# Patient Record
Sex: Female | Born: 1945 | Race: White | Hispanic: No | Marital: Married | Smoking: Never smoker
Health system: Southern US, Community
[De-identification: ages and names within clinical notes are randomized; demographics above are authoritative.]

## PROBLEM LIST (undated history)

## (undated) DIAGNOSIS — M792 Neuralgia and neuritis, unspecified: Secondary | ICD-10-CM

## (undated) DIAGNOSIS — G501 Atypical facial pain: Secondary | ICD-10-CM

## (undated) DIAGNOSIS — G4730 Sleep apnea, unspecified: Secondary | ICD-10-CM

## (undated) DIAGNOSIS — T7840XA Allergy, unspecified, initial encounter: Secondary | ICD-10-CM

## (undated) DIAGNOSIS — E785 Hyperlipidemia, unspecified: Secondary | ICD-10-CM

## (undated) DIAGNOSIS — G500 Trigeminal neuralgia: Secondary | ICD-10-CM

## (undated) HISTORY — DX: Trigeminal neuralgia: G50.0

## (undated) HISTORY — PX: COLONOSCOPY: SHX174

## (undated) HISTORY — DX: Atypical facial pain: G50.1

## (undated) HISTORY — DX: Sleep apnea, unspecified: G47.30

## (undated) HISTORY — DX: Neuralgia and neuritis, unspecified: M79.2

## (undated) HISTORY — PX: POLYPECTOMY: SHX149

## (undated) HISTORY — DX: Allergy, unspecified, initial encounter: T78.40XA

## (undated) HISTORY — PX: UPPER GASTROINTESTINAL ENDOSCOPY: SHX188

## (undated) HISTORY — DX: Hyperlipidemia, unspecified: E78.5

---
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
## 2001-11-30 HISTORY — PX: OTHER SURGICAL HISTORY: SHX169

## 2006-10-08 ENCOUNTER — Ambulatory Visit: Payer: Self-pay | Admitting: Internal Medicine

## 2006-10-29 ENCOUNTER — Ambulatory Visit: Payer: Self-pay | Admitting: Internal Medicine

## 2007-03-12 ENCOUNTER — Emergency Department (HOSPITAL_COMMUNITY): Admission: EM | Admit: 2007-03-12 | Discharge: 2007-03-12 | Payer: Self-pay | Admitting: Emergency Medicine

## 2008-11-30 HISTORY — PX: OTHER SURGICAL HISTORY: SHX169

## 2011-07-07 ENCOUNTER — Encounter: Payer: Self-pay | Admitting: Family Medicine

## 2011-07-07 ENCOUNTER — Ambulatory Visit (INDEPENDENT_AMBULATORY_CARE_PROVIDER_SITE_OTHER): Payer: Medicare Other | Admitting: Family Medicine

## 2011-07-07 DIAGNOSIS — Z79899 Other long term (current) drug therapy: Secondary | ICD-10-CM

## 2011-07-07 DIAGNOSIS — E669 Obesity, unspecified: Secondary | ICD-10-CM | POA: Insufficient documentation

## 2011-07-07 DIAGNOSIS — Z01419 Encounter for gynecological examination (general) (routine) without abnormal findings: Secondary | ICD-10-CM

## 2011-07-07 DIAGNOSIS — M8580 Other specified disorders of bone density and structure, unspecified site: Secondary | ICD-10-CM

## 2011-07-07 DIAGNOSIS — E663 Overweight: Secondary | ICD-10-CM

## 2011-07-07 DIAGNOSIS — G4730 Sleep apnea, unspecified: Secondary | ICD-10-CM

## 2011-07-07 DIAGNOSIS — M949 Disorder of cartilage, unspecified: Secondary | ICD-10-CM

## 2011-07-07 DIAGNOSIS — G500 Trigeminal neuralgia: Secondary | ICD-10-CM

## 2011-07-07 DIAGNOSIS — M899 Disorder of bone, unspecified: Secondary | ICD-10-CM

## 2011-07-07 DIAGNOSIS — Z0000 Encounter for general adult medical examination without abnormal findings: Secondary | ICD-10-CM | POA: Insufficient documentation

## 2011-07-07 LAB — HEPATIC FUNCTION PANEL
AST: 31 U/L (ref 0–37)
Alkaline Phosphatase: 78 U/L (ref 39–117)
Total Bilirubin: 0.6 mg/dL (ref 0.3–1.2)

## 2011-07-07 LAB — BASIC METABOLIC PANEL
GFR: 92.24 mL/min (ref >60.00)
Potassium: 4.7 mEq/L (ref 3.5–5.1)
Sodium: 146 mEq/L — ABNORMAL HIGH (ref 135–145)

## 2011-07-07 LAB — TSH: TSH: 0.87 u[IU]/mL (ref 0.35–5.50)

## 2011-07-07 LAB — CBC WITH DIFFERENTIAL/PLATELET
Eosinophils Absolute: 0.1 10*3/uL (ref 0.0–0.7)
MCHC: 33.6 g/dL (ref 30.0–36.0)
MCV: 92.9 fl (ref 78.0–100.0)
Monocytes Absolute: 0.3 10*3/uL (ref 0.1–1.0)
Neutrophils Relative %: 58.9 % (ref 43.0–77.0)
Platelets: 267 10*3/uL (ref 150.0–400.0)

## 2011-07-07 LAB — LIPID PANEL
HDL: 62.9 mg/dL (ref >39.00)
VLDL: 21.2 mg/dL (ref 0.0–40.0)

## 2011-07-07 NOTE — Patient Instructions (Signed)
Schedule your complete physical at your convenience- you can eat before this We'll notify you of your lab results We will do your pap and breast exam at your next visit Welcome!  We're glad to have you!!

## 2011-07-07 NOTE — Progress Notes (Signed)
  Subjective:    Patient ID: Laura Stuart, female    DOB: March 29, 1946, 65 y.o.   MRN: [id number]  HPI New to establish, previous MD- [place] at [facility].  Neuro- Dr Vickey Huger.  GI- [facility].  Trigeminal neuralgia- sees Dr Vickey Huger regularly.  On carbamazepine w/ good control of sxs.  Not currently having sxs.  Sleep apnea- chronic problem for pt, sees Dr Vickey Huger regularly.  Uses CPAP nightly.  BP is normal, no daytime sleepiness.  Will take Ambien as needed, ~2x/month.  Vegetarian- was previously 180 lbs and changed her diet and lost 30 lbs.  Exercises regularly- walks 30-44minutes 5-6x/week.  Also has [employer].  Review of Systems For ROS see HPI     Objective:   Physical Exam  Vitals reviewed. Constitutional: She is oriented to person, place, and time. She appears well-developed and well-nourished. No distress.  HENT:  Head: Normocephalic and atraumatic.  Eyes: Conjunctivae and EOM are normal. Pupils are equal, round, and reactive to light.  Neck: Normal range of motion. Neck supple. No thyromegaly present.  Cardiovascular: Normal rate, regular rhythm, normal heart sounds and intact distal pulses.   No murmur heard. Pulmonary/Chest: Effort normal and breath sounds normal. No respiratory distress.  Abdominal: Soft. She exhibits no distension. There is no tenderness.  Musculoskeletal: She exhibits no edema.  Lymphadenopathy:    She has no cervical adenopathy.  Neurological: She is alert and oriented to person, place, and time.  Skin: Skin is warm and dry.  Psychiatric: She has a normal mood and affect. Her behavior is normal.          Assessment & Plan:

## 2011-07-08 ENCOUNTER — Telehealth: Payer: Self-pay | Admitting: *Deleted

## 2011-07-08 NOTE — Telephone Encounter (Signed)
Left message to call back  

## 2011-07-08 NOTE — Telephone Encounter (Signed)
Message copied by Romualdo Bolk on Wed Jul 08, 2011 11:38 AM ------      Message from: Sheliah Hatch      Created: Wed Jul 08, 2011 10:37 AM       Labs look great!  Keep up the good work!

## 2011-07-09 LAB — VITAMIN D 1,25 DIHYDROXY
Vitamin D 1, 25 (OH)2 Total: 58 pg/mL (ref 18–72)
Vitamin D3 1, 25 (OH)2: 58 pg/mL

## 2011-07-09 NOTE — Telephone Encounter (Signed)
Pt aware of results 

## 2011-07-09 NOTE — Telephone Encounter (Signed)
Patient is out of town---please call her back on her cell phone =[phone]

## 2011-07-10 ENCOUNTER — Telehealth: Payer: Self-pay

## 2011-07-10 NOTE — Telephone Encounter (Signed)
Message copied by Beverely Low on Fri Jul 10, 2011  1:20 PM ------      Message from: Sheliah Hatch      Created: Fri Jul 10, 2011  7:56 AM       Vit D normal

## 2011-07-10 NOTE — Telephone Encounter (Signed)
Left message to notify pt labs nl

## 2011-07-19 NOTE — Assessment & Plan Note (Signed)
On carbamazapine, sxs are well controlled.  Follows regularly w/ neuro.

## 2011-07-19 NOTE — Assessment & Plan Note (Signed)
Pt reports sxs are well controlled w/ CPAP.  Follows w/ Dr Dohmeier regularly.

## 2011-07-19 NOTE — Assessment & Plan Note (Signed)
Per pt report.  Due for DEXA.  Will assess and start meds prn.

## 2011-07-19 NOTE — Assessment & Plan Note (Signed)
Pt has lost significant weight by becoming vegetarian.  Encouraged her to be vigilant about protein intake and making sure she has a balanced diet.  Will get labs to assess for risk factors.

## 2011-07-19 NOTE — Assessment & Plan Note (Signed)
Check labs for upcoming CPE 

## 2011-08-31 ENCOUNTER — Encounter: Payer: Self-pay | Admitting: Family Medicine

## 2011-09-08 ENCOUNTER — Encounter: Payer: Medicare Other | Admitting: Family Medicine

## 2011-09-14 ENCOUNTER — Telehealth: Payer: Self-pay

## 2011-09-14 NOTE — Telephone Encounter (Signed)
Left message to notify pt of bone density results

## 2011-09-15 ENCOUNTER — Encounter: Payer: Self-pay | Admitting: Family Medicine

## 2011-10-06 ENCOUNTER — Telehealth: Payer: Self-pay | Admitting: *Deleted

## 2011-10-06 MED ORDER — ZOLPIDEM TARTRATE 10 MG PO TABS: 10.0000 mg | ORAL_TABLET | Freq: Every evening | ORAL | Status: DC | PRN

## 2011-10-06 NOTE — Telephone Encounter (Signed)
Pt wants refill on Ambien noted last OV 07-07-11 last refill unsure

## 2011-10-06 NOTE — Telephone Encounter (Signed)
Ok for #30, no refills 

## 2011-10-06 NOTE — Telephone Encounter (Signed)
Manually faxed ambien rx to [facility]

## 2011-10-29 ENCOUNTER — Ambulatory Visit (INDEPENDENT_AMBULATORY_CARE_PROVIDER_SITE_OTHER): Payer: Medicare Other | Admitting: Family Medicine

## 2011-10-29 ENCOUNTER — Other Ambulatory Visit (HOSPITAL_COMMUNITY)
Admission: RE | Admit: 2011-10-29 | Discharge: 2011-10-29 | Disposition: A | Discharge status: Home / Self Care | Payer: Medicare Other | Source: Ambulatory Visit | Attending: Family Medicine | Admitting: Family Medicine

## 2011-10-29 ENCOUNTER — Encounter: Payer: Self-pay | Admitting: Family Medicine

## 2011-10-29 VITALS — BP 120/75 | HR 85 | Temp 97.7°F | Ht 65.25 in | Wt 163.2 lb

## 2011-10-29 DIAGNOSIS — Z124 Encounter for screening for malignant neoplasm of cervix: Secondary | ICD-10-CM | POA: Insufficient documentation

## 2011-10-29 DIAGNOSIS — Z01419 Encounter for gynecological examination (general) (routine) without abnormal findings: Secondary | ICD-10-CM | POA: Insufficient documentation

## 2011-10-29 DIAGNOSIS — Z136 Encounter for screening for cardiovascular disorders: Secondary | ICD-10-CM

## 2011-10-29 DIAGNOSIS — Z0000 Encounter for general adult medical examination without abnormal findings: Secondary | ICD-10-CM

## 2011-10-29 DIAGNOSIS — Z23 Encounter for immunization: Secondary | ICD-10-CM

## 2011-10-29 NOTE — Progress Notes (Signed)
  Subjective:    Patient ID: Laura Stuart, female    DOB: 1946/10/09, 65 y.o.   MRN: [id number]  HPI Here today for CPE.  Risk Factors: Osteopenia- recent DEXA confirms osteopenia, on Ca Citrate and Vit D 2500 units daily. Physical Activity: exercising regularly (walking 2.5 miles) Fall Risk: Low risk Depression: no current sxs Hearing: normal to conversational tones, slightly decreased to whispered voice ADL's: independent Cognitive: normal linear thought process, memory and attention intact Home Safety: safe at home, lives w/ husband Height, Weight, BMI, Visual Acuity: see vitals, vision corrected to 20/20 w/ glasses Counseling: UTD on mammo and DEXA, UTD on colonoscopy (Dr Jarold Motto), has had both pneumovax and zostavax Labs Ordered: See A&P Care Plan: See A&P    Review of Systems Patient reports no vision/ hearing changes, adenopathy,fever, weight change,  persistant/recurrent hoarseness , swallowing issues, chest pain, palpitations, edema, persistant/recurrent cough, hemoptysis, dyspnea (rest/exertional/paroxysmal nocturnal), gastrointestinal bleeding (melena, rectal bleeding), abdominal pain, significant heartburn, bowel changes, GU symptoms (dysuria, hematuria, incontinence), Gyn symptoms (abnormal  bleeding, pain),  syncope, focal weakness, memory loss, numbness & tingling, skin/hair/nail changes, abnormal bruising or bleeding, anxiety, or depression.     Objective:   Physical Exam  General Appearance:    Alert, cooperative, no distress, appears stated age  Head:    Normocephalic, without obvious abnormality, atraumatic  Eyes:    PERRL, conjunctiva/corneas clear, EOM's intact, fundi    benign, both eyes  Ears:    Normal TM's and external ear canals, both ears  Nose:   Nares normal, septum midline, mucosa normal, no drainage    or sinus tenderness  Throat:   Lips, mucosa, and tongue normal; teeth and gums normal  Neck:   Supple, symmetrical, trachea midline, no  adenopathy;    Thyroid: no enlargement/tenderness/nodules  Back:     Symmetric, no curvature, ROM normal, no CVA tenderness  Lungs:     Clear to auscultation bilaterally, respirations unlabored  Chest Wall:    No tenderness or deformity   Heart:    Regular rate and rhythm, S1 and S2 normal, no murmur, rub   or gallop  Breast Exam:    No tenderness, masses, or nipple abnormality  Abdomen:     Soft, non-tender, bowel sounds active all four quadrants,    no masses, no organomegaly  Genitalia:    External genitalia normal, cervix normal in appearance, no CMT, uterus in normal size and position, adnexa w/out mass or tenderness, mucosa pink and moist, no lesions or discharge present  Rectal:    Normal external appearance  Extremities:   Extremities normal, atraumatic, no cyanosis or edema  Pulses:   2+ and symmetric all extremities  Skin:   Skin color, texture, turgor normal, no rashes or lesions  Lymph nodes:   Cervical, supraclavicular, and axillary nodes normal  Neurologic:   CNII-XII intact, normal strength, sensation and reflexes    throughout          Assessment & Plan:

## 2011-10-29 NOTE — Patient Instructions (Signed)
Your exam look great!  Keep up the good work! Go to [address] to get your chest xray, we'll call you with the results Call with any questions or concerns Happy Holidays!!!

## 2011-11-08 NOTE — Assessment & Plan Note (Signed)
Pap collected. 

## 2011-11-08 NOTE — Assessment & Plan Note (Signed)
Pt's PE WNL.  UTD on health maintenance.  Will get screening CXR as part of Welcome to [employer] PE.  EKG done- see document for interpretation.  Reviewed previous labs.  Anticipatory guidance provided.

## 2012-02-03 ENCOUNTER — Telehealth: Payer: Self-pay | Admitting: Family Medicine

## 2012-02-03 NOTE — Telephone Encounter (Signed)
Zolpidem Tartrate (Tab) AMBIEN 10 MG Take 1 tablet (10 mg total) by mouth at bedtime as needed.

## 2012-02-04 MED ORDER — ZOLPIDEM TARTRATE 10 MG PO TABS: 10.0000 mg | ORAL_TABLET | Freq: Every evening | ORAL | Status: DC | PRN

## 2012-02-04 NOTE — Telephone Encounter (Signed)
Ok for #30, 1 refill 

## 2012-02-04 NOTE — Telephone Encounter (Signed)
Last OV 10-28-12 last refill 10-06-11 #30 no refills

## 2012-02-04 NOTE — Telephone Encounter (Signed)
.  rx faxed to pharmacy, manually.  

## 2012-05-20 ENCOUNTER — Other Ambulatory Visit: Payer: Self-pay | Admitting: Family Medicine

## 2012-05-20 ENCOUNTER — Other Ambulatory Visit (INDEPENDENT_AMBULATORY_CARE_PROVIDER_SITE_OTHER): Payer: Medicare Other

## 2012-05-20 DIAGNOSIS — M6281 Muscle weakness (generalized): Secondary | ICD-10-CM

## 2012-05-20 LAB — CBC WITH DIFFERENTIAL/PLATELET
Eosinophils Absolute: 0.1 10*3/uL (ref 0.0–0.7)
Eosinophils Relative: 2.2 % (ref 0.0–5.0)
HCT: 37.5 % (ref 36.0–46.0)
Lymphs Abs: 1.2 10*3/uL (ref 0.7–4.0)
MCHC: 33.5 g/dL (ref 30.0–36.0)
MCV: 92.9 fl (ref 78.0–100.0)
Monocytes Absolute: 0.4 10*3/uL (ref 0.1–1.0)
Neutrophils Relative %: 56.8 % (ref 43.0–77.0)
Platelets: 220 10*3/uL (ref 150.0–400.0)
RDW: 13.7 % (ref 11.5–14.6)

## 2012-05-20 MED ORDER — ZOLPIDEM TARTRATE 10 MG PO TABS: 10.0000 mg | ORAL_TABLET | Freq: Every evening | ORAL | Status: DC | PRN

## 2012-05-20 NOTE — Telephone Encounter (Signed)
.  rx faxed to pharmacy, manually.  

## 2012-05-20 NOTE — Telephone Encounter (Signed)
Ok for #30, 1 refill 

## 2012-05-20 NOTE — Telephone Encounter (Signed)
Last OV 10-29-11 last refill 02-04-12 #30 with 1 refill

## 2012-08-26 ENCOUNTER — Other Ambulatory Visit: Payer: Self-pay

## 2012-08-26 MED ORDER — ZOLPIDEM TARTRATE 10 MG PO TABS: 10.0000 mg | ORAL_TABLET | Freq: Every evening | ORAL | Status: DC | PRN

## 2012-08-26 NOTE — Telephone Encounter (Signed)
Need okay.   MW  

## 2012-09-02 ENCOUNTER — Encounter: Payer: Self-pay | Admitting: Family Medicine

## 2013-02-05 ENCOUNTER — Ambulatory Visit: Payer: Medicare Other

## 2013-02-05 ENCOUNTER — Ambulatory Visit (INDEPENDENT_AMBULATORY_CARE_PROVIDER_SITE_OTHER): Payer: Medicare Other | Admitting: Family Medicine

## 2013-02-05 ENCOUNTER — Other Ambulatory Visit: Payer: Self-pay | Admitting: Family Medicine

## 2013-02-05 VITALS — BP 108/62 | HR 70 | Temp 98.4°F | Resp 16 | Ht 64.5 in | Wt 170.6 lb

## 2013-02-05 DIAGNOSIS — R52 Pain, unspecified: Secondary | ICD-10-CM

## 2013-02-05 DIAGNOSIS — K625 Hemorrhage of anus and rectum: Secondary | ICD-10-CM

## 2013-02-05 DIAGNOSIS — R109 Unspecified abdominal pain: Secondary | ICD-10-CM

## 2013-02-05 LAB — POCT CBC
Granulocyte percent: 78 %G (ref 37–80)
HCT, POC: 39 % (ref 37.7–47.9)
Hemoglobin: 12.8 g/dL (ref 12.2–16.2)
MCV: 90.6 fL (ref 80–97)
POC Granulocyte: 6 (ref 2–6.9)
POC LYMPH PERCENT: 14.2 %L (ref 10–50)
RBC: 4.31 M/uL (ref 4.04–5.48)
RDW, POC: 13.1 %

## 2013-02-05 LAB — IFOBT (OCCULT BLOOD): IFOBT: POSITIVE

## 2013-02-05 MED ORDER — HYDROCODONE-ACETAMINOPHEN 5-325 MG PO TABS: 1.0000 | ORAL_TABLET | Freq: Four times a day (QID) | ORAL | Status: DC | PRN

## 2013-02-05 MED ORDER — METRONIDAZOLE 500 MG PO TABS: 500.0000 mg | ORAL_TABLET | Freq: Three times a day (TID) | ORAL | Status: DC

## 2013-02-05 MED ORDER — CIPROFLOXACIN HCL 500 MG PO TABS: 500.0000 mg | ORAL_TABLET | Freq: Two times a day (BID) | ORAL | Status: DC
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
## 2013-02-05 NOTE — Patient Instructions (Addendum)
Colitis °Colitis is inflammation of the colon. Colitis can be a short-term or long-standing (chronic) illness. Crohn's disease and ulcerative colitis are 2 types of colitis which are chronic. They usually require lifelong treatment. °CAUSES  °There are many different causes of colitis, including: °· Viruses. °· Germs (bacteria). °· Medicine reactions. °SYMPTOMS  °· Diarrhea. °· Intestinal bleeding. °· Pain. °· Fever. °· Throwing up (vomiting). °· Tiredness (fatigue). °· Weight loss. °· Bowel blockage. °DIAGNOSIS  °The diagnosis of colitis is based on examination and stool or blood tests. X-rays, CT scan, and colonoscopy may also be needed. °TREATMENT  °Treatment may include: °· Fluids given through the vein (intravenously). °· Bowel rest (nothing to eat or drink for a period of time). °· Medicine for pain and diarrhea. °· Medicines (antibiotics) that kill germs. °· Cortisone medicines. °· Surgery. °HOME CARE INSTRUCTIONS  °· Get plenty of rest. °· Drink enough water and fluids to keep your urine clear or pale yellow. °· Eat a well-balanced diet. °· Call your caregiver for follow-up as recommended. °SEEK IMMEDIATE MEDICAL CARE IF:  °· You develop chills. °· You have an oral temperature above 102° F (38.9° C), not controlled by medicine. °· You have extreme weakness, fainting, or dehydration. °· You have repeated vomiting. °· You develop severe belly (abdominal) pain or are passing bloody or tarry stools. °MAKE SURE YOU:  °· Understand these instructions. °· Will watch your condition. °· Will get help right away if you are not doing well or get worse. °Document Released: 12/24/2004 Document Revised: 02/08/2012 Document Reviewed: 03/21/2010 °ExitCare® Patient Information ©2013 [employer]. ° °

## 2013-02-06 ENCOUNTER — Telehealth: Payer: Self-pay

## 2013-02-06 NOTE — Telephone Encounter (Signed)
Spoke to patient advised we will call her when they come in. She states she is better, but still has a few symptoms. She will await our call for culture reports and follow up instructions Amy.

## 2013-02-06 NOTE — Addendum Note (Signed)
Addended by: Thelma Barge D on: 02/06/2013 02:30 PM   Modules accepted: Orders

## 2013-02-06 NOTE — Telephone Encounter (Signed)
Patient wanted to ask Dr Clelia Croft whether she should be seen again. She brought her samples in today and is feeling a little better. She is wondering if she should wait for the results to come back for these samples or if she should be seen.  Best [phone]

## 2013-02-07 LAB — CLOSTRIDIUM DIFFICILE EIA: CDIFTX: NEGATIVE

## 2013-02-07 LAB — COMPREHENSIVE METABOLIC PANEL
Albumin: 4.2 g/dL (ref 3.5–5.2)
Alkaline Phosphatase: 93 U/L (ref 39–117)
BUN: 16 mg/dL (ref 6–23)
CO2: 25 mEq/L (ref 19–32)
Calcium: 9.5 mg/dL (ref 8.4–10.5)
Chloride: 106 mEq/L (ref 96–112)
Glucose, Bld: 108 mg/dL — ABNORMAL HIGH (ref 70–99)
Potassium: 4.6 mEq/L (ref 3.5–5.3)
Sodium: 142 mEq/L (ref 135–145)
Total Protein: 6.4 g/dL (ref 6.0–8.3)

## 2013-02-09 LAB — STOOL CULTURE

## 2013-04-01 ENCOUNTER — Encounter: Payer: Self-pay | Admitting: Family Medicine

## 2013-05-02 ENCOUNTER — Telehealth: Payer: Self-pay | Admitting: *Deleted

## 2013-05-02 NOTE — Telephone Encounter (Signed)
No refill- pt has not been seen since 2012

## 2013-05-02 NOTE — Telephone Encounter (Signed)
Last OV 10-29-11, last filled 08-26-12 #30 1

## 2013-05-02 NOTE — Telephone Encounter (Signed)
LM @ (5:30pm) asking the pt to RTC.  Regarding med request.//AB/CMA

## 2013-05-03 NOTE — Telephone Encounter (Signed)
Pt was informed that refill was denied,so pt scheduled an appt with Dr. Beverely Low to discuss refilling med.//AB/CMA

## 2013-05-04 ENCOUNTER — Ambulatory Visit (INDEPENDENT_AMBULATORY_CARE_PROVIDER_SITE_OTHER): Payer: Medicare Other | Admitting: Family Medicine

## 2013-05-04 ENCOUNTER — Encounter: Payer: Self-pay | Admitting: Family Medicine

## 2013-05-04 VITALS — BP 100/60 | HR 84 | Temp 98.1°F | Ht 64.5 in | Wt 174.8 lb

## 2013-05-04 DIAGNOSIS — G4700 Insomnia, unspecified: Secondary | ICD-10-CM

## 2013-05-04 MED ORDER — ZOLPIDEM TARTRATE 10 MG PO TABS: 10.0000 mg | ORAL_TABLET | Freq: Every evening | ORAL | Status: DC | PRN

## 2013-05-04 NOTE — Patient Instructions (Addendum)
Follow up as scheduled for your physical Take the Ambien as needed Call with any questions or concerns Happy [place] Birthday!!!

## 2013-05-04 NOTE — Progress Notes (Signed)
  Subjective:    Patient ID: Laura Stuart, female    DOB: 1946/10/20, 67 y.o.   MRN: [id number]  HPI Insomnia- pt has sleep apnea and will require Ambien to sleep b/c of the machinery.  Started 7 yrs ago when pt was very stressed.  Difficulty falling asleep.  No difficulty staying asleep.  No AM hangover.  No motor activities while sleeping.   Review of Systems For ROS see HPI     Objective:   Physical Exam  Vitals reviewed. Constitutional: She is oriented to person, place, and time. She appears well-developed and well-nourished. No distress.  HENT:  Head: Normocephalic and atraumatic.  Neck: Normal range of motion. Neck supple. No thyromegaly present.  Cardiovascular: Normal rate, regular rhythm and normal heart sounds.   Pulmonary/Chest: Effort normal and breath sounds normal. No respiratory distress. She has no wheezes. She has no rales.  Lymphadenopathy:    She has no cervical adenopathy.  Neurological: She is alert and oriented to person, place, and time.  Psychiatric: She has a normal mood and affect. Her behavior is normal.          Assessment & Plan:

## 2013-05-07 NOTE — Assessment & Plan Note (Signed)
New.  Pt takes Ambien to fall asleep when wearing the CPAP machinery.  Refill provided.

## 2013-05-19 ENCOUNTER — Encounter: Payer: Self-pay | Admitting: Lab

## 2013-05-22 ENCOUNTER — Ambulatory Visit (INDEPENDENT_AMBULATORY_CARE_PROVIDER_SITE_OTHER): Payer: Medicare Other | Admitting: Family Medicine

## 2013-05-22 ENCOUNTER — Encounter: Payer: Self-pay | Admitting: Family Medicine

## 2013-05-22 VITALS — BP 106/80 | HR 69 | Temp 97.9°F | Ht 64.5 in | Wt 174.6 lb

## 2013-05-22 DIAGNOSIS — E663 Overweight: Secondary | ICD-10-CM

## 2013-05-22 DIAGNOSIS — Z23 Encounter for immunization: Secondary | ICD-10-CM

## 2013-05-22 DIAGNOSIS — M899 Disorder of bone, unspecified: Secondary | ICD-10-CM

## 2013-05-22 DIAGNOSIS — Z01419 Encounter for gynecological examination (general) (routine) without abnormal findings: Secondary | ICD-10-CM

## 2013-05-22 DIAGNOSIS — Z1331 Encounter for screening for depression: Secondary | ICD-10-CM

## 2013-05-22 DIAGNOSIS — M949 Disorder of cartilage, unspecified: Secondary | ICD-10-CM

## 2013-05-22 DIAGNOSIS — M8580 Other specified disorders of bone density and structure, unspecified site: Secondary | ICD-10-CM

## 2013-05-22 LAB — HEPATIC FUNCTION PANEL
ALT: 20 U/L (ref 0–35)
AST: 24 U/L (ref 0–37)
Albumin: 3.7 g/dL (ref 3.5–5.2)
Alkaline Phosphatase: 68 U/L (ref 39–117)
Bilirubin, Direct: 0.1 mg/dL (ref 0.0–0.3)
Total Bilirubin: 0.5 mg/dL (ref 0.3–1.2)
Total Protein: 6.3 g/dL (ref 6.0–8.3)

## 2013-05-22 LAB — BASIC METABOLIC PANEL
BUN: 21 mg/dL (ref 6–23)
CO2: 29 mEq/L (ref 19–32)
Calcium: 9 mg/dL (ref 8.4–10.5)
Chloride: 108 mEq/L (ref 96–112)
Creatinine, Ser: 0.7 mg/dL (ref 0.4–1.2)
GFR: 83.18 mL/min (ref >60.00)
Glucose, Bld: 92 mg/dL (ref 70–99)
Potassium: 3.7 mEq/L (ref 3.5–5.1)
Sodium: 142 mEq/L (ref 135–145)

## 2013-05-22 LAB — CBC WITH DIFFERENTIAL/PLATELET
Basophils Relative: 1.7 % (ref 0.0–3.0)
Eosinophils Relative: 3.6 % (ref 0.0–5.0)
HCT: 33.7 % — ABNORMAL LOW (ref 36.0–46.0)
MCV: 88.7 fl (ref 78.0–100.0)
Monocytes Absolute: 0.3 10*3/uL (ref 0.1–1.0)
Monocytes Relative: 9.3 % (ref 3.0–12.0)
Neutrophils Relative %: 57.4 % (ref 43.0–77.0)
RBC: 3.8 Mil/uL — ABNORMAL LOW (ref 3.87–5.11)
WBC: 3.7 10*3/uL — ABNORMAL LOW (ref 4.5–10.5)

## 2013-05-22 LAB — TSH: TSH: 3.93 u[IU]/mL (ref 0.35–5.50)

## 2013-05-22 LAB — LIPID PANEL: HDL: 53.2 mg/dL (ref >39.00)

## 2013-05-22 LAB — LDL CHOLESTEROL, DIRECT: Direct LDL: 106.9 mg/dL

## 2013-05-22 NOTE — Assessment & Plan Note (Signed)
Chronic problem.  On Ca and Vit D daily.  UTD on DEXA.  Check Vit D level.  Will follow.

## 2013-05-22 NOTE — Patient Instructions (Addendum)
Follow up in 1 year or as needed We'll notify you of your lab results and make any changes if needed Keep up the good work!  You look great! Call with any questions or concerns Have a great summer!!! 

## 2013-05-22 NOTE — Progress Notes (Signed)
  Subjective:    Patient ID: Laura Stuart, female    DOB: 06/29/1946, 67 y.o.   MRN: [id number]  HPI Here today for CPE.  Risk Factors: Osteopenia- chronic problem, on Ca and Vit D daily.  Walking regularly.  UTD on DEXA. Overweight- chronic problem, no longer Vegan but remains vegetarian.  Weight is again creeping up after losing 30 lbs.  Pt is concerned about her labs. Physical Activity: very active, walking regularly Fall Risk: low risk Depression: denies current sxs Hearing: normal to conversational tones and whispered voice at 6 ft ADL's: independent Cognitive: normal linear thought process, memory and attention intact Home Safety: safe at home, lives w/ husband Height, Weight, BMI, Visual Acuity: see vitals, vision corrected to 20/20 w/ glasses Counseling: UTD on colonoscopy, mammo, DEXA, pap Labs Ordered: See A&P Care Plan: See A&P    Review of Systems Patient reports no vision/ hearing changes, adenopathy,fever, weight change,  persistant/recurrent hoarseness , swallowing issues, chest pain, palpitations, edema, persistant/recurrent cough, hemoptysis, dyspnea (rest/exertional/paroxysmal nocturnal), gastrointestinal bleeding (melena, rectal bleeding), abdominal pain, significant heartburn, bowel changes, GU symptoms (dysuria, hematuria, incontinence), Gyn symptoms (abnormal  bleeding, pain),  syncope, focal weakness, memory loss, numbness & tingling, skin/hair/nail changes, abnormal bruising or bleeding, anxiety, or depression.     Objective:   Physical Exam General Appearance:    Alert, cooperative, no distress, appears stated age  Head:    Normocephalic, without obvious abnormality, atraumatic  Eyes:    PERRL, conjunctiva/corneas clear, EOM's intact, fundi    benign, both eyes  Ears:    Normal TM's and external ear canals, both ears  Nose:   Nares normal, septum midline, mucosa normal, no drainage    or sinus tenderness  Throat:   Lips, mucosa, and tongue normal;  teeth and gums normal  Neck:   Supple, symmetrical, trachea midline, no adenopathy;    Thyroid: no enlargement/tenderness/nodules  Back:     Symmetric, no curvature, ROM normal, no CVA tenderness  Lungs:     Clear to auscultation bilaterally, respirations unlabored  Chest Wall:    No tenderness or deformity   Heart:    Regular rate and rhythm, S1 and S2 normal, no murmur, rub   or gallop  Breast Exam:    Deferred to GYN  Abdomen:     Soft, non-tender, bowel sounds active all four quadrants,    no masses, no organomegaly  Genitalia:    Deferred to GYN  Rectal:    Extremities:   Extremities normal, atraumatic, no cyanosis or edema  Pulses:   2+ and symmetric all extremities  Skin:   Skin color, texture, turgor normal, no rashes or lesions  Lymph nodes:   Cervical, supraclavicular, and axillary nodes normal  Neurologic:   CNII-XII intact, normal strength, sensation and reflexes    throughout          Assessment & Plan:

## 2013-05-22 NOTE — Assessment & Plan Note (Signed)
Pt's PE WNL.  UTD on health maintenance.  Pneumovax given today.  Check labs.  Anticipatory guidance provided.

## 2013-05-22 NOTE — Assessment & Plan Note (Signed)
Ongoing problem for pt.  Had lost 30 lbs when vegan.  Weight is starting to increase again.  Check labs to risk stratify.  Encouraged healthy diet and regular exercise.  Will follow.

## 2013-05-22 NOTE — Addendum Note (Signed)
Addended by: Verdie Shire on: 05/22/2013 09:41 AM   Modules accepted: Orders

## 2013-05-26 LAB — VITAMIN D 1,25 DIHYDROXY
Vitamin D 1, 25 (OH)2 Total: 37 pg/mL (ref 18–72)
Vitamin D2 1, 25 (OH)2: <8 pg/mL
Vitamin D3 1, 25 (OH)2: 37 pg/mL

## 2013-06-30 HISTORY — PX: ROOT CANAL: SHX2363

## 2013-07-05 ENCOUNTER — Telehealth: Payer: Self-pay | Admitting: Neurology

## 2013-07-05 MED ORDER — CARBAMAZEPINE ER 200 MG PO TB12: 200.0000 mg | ORAL_TABLET | Freq: Three times a day (TID) | ORAL | Status: DC

## 2013-07-05 NOTE — Telephone Encounter (Signed)
A 30 day Rx has been sent to the local pharmacy and a 90 day Rx has been sent to the mail order pharmacy.

## 2013-07-24 ENCOUNTER — Encounter: Payer: Self-pay | Admitting: Neurology

## 2013-07-26 ENCOUNTER — Encounter: Payer: Self-pay | Admitting: Neurology

## 2013-07-26 ENCOUNTER — Ambulatory Visit (INDEPENDENT_AMBULATORY_CARE_PROVIDER_SITE_OTHER): Payer: Self-pay | Admitting: Neurology

## 2013-07-26 VITALS — BP 121/71 | HR 67 | Resp 16 | Ht 64.0 in | Wt 174.0 lb

## 2013-07-26 DIAGNOSIS — G4733 Obstructive sleep apnea (adult) (pediatric): Secondary | ICD-10-CM

## 2013-07-26 DIAGNOSIS — G4730 Sleep apnea, unspecified: Secondary | ICD-10-CM | POA: Insufficient documentation

## 2013-07-26 NOTE — Patient Instructions (Signed)
Exercise to Lose Weight Exercise and a healthy diet may help you lose weight. Your doctor may suggest specific exercises. EXERCISE IDEAS AND TIPS  Choose low-cost things you enjoy doing, such as walking, bicycling, or exercising to workout videos.  Take stairs instead of the elevator.  Walk during your lunch break.  Park your car further away from work or school.  Go to a gym or an exercise class.  Start with 5 to 10 minutes of exercise each day. Build up to 30 minutes of exercise 4 to 6 days a week.  Wear shoes with good support and comfortable clothes.  Stretch before and after working out.  Work out until you breathe harder and your heart beats faster.  Drink extra water when you exercise.  Do not do so much that you hurt yourself, feel dizzy, or get very short of breath. Exercises that burn about 150 calories:  Running 1  miles in 15 minutes.  Playing volleyball for 45 to 60 minutes.  Washing and waxing a car for 45 to 60 minutes.  Playing touch football for 45 minutes.  Walking 1  miles in 35 minutes.  Pushing a stroller 1  miles in 30 minutes.  Playing basketball for 30 minutes.  Raking leaves for 30 minutes.  Bicycling 5 miles in 30 minutes.  Walking 2 miles in 30 minutes.  Dancing for 30 minutes.  Shoveling snow for 15 minutes.  Swimming laps for 20 minutes.  Walking up stairs for 15 minutes.  Bicycling 4 miles in 15 minutes.  Gardening for 30 to 45 minutes.  Jumping rope for 15 minutes.  Washing windows or floors for 45 to 60 minutes. Document Released: 12/19/2010 Document Revised: 02/08/2012 Document Reviewed: 12/19/2010 [facility] Patient Information 2014 [place], [place]. CPAP and BIPAP CPAP and BIPAP are methods of helping you breathe. CPAP stands for "continuous positive airway pressure." BIPAP stands for "bi-level positive airway pressure." Both CPAP and BIPAP are provided by a small machine with a flexible plastic tube that attaches to  a plastic mask that goes over your nose or mouth. Air is blown into your air passages through your nose or mouth. This helps to keep your airways open and helps to keep you breathing well. The amount of pressure that is used to blow the air into your air passages can be set on the machine. The pressure setting is based on your needs. With CPAP, the amount of pressure stays the same while you breathe in and out. With BIPAP, the amount of pressure changes when you inhale and exhale. Your caregiver will recommend whether CPAP or BIPAP would be more helpful for you.  CPAP and BIPAP can be helpful for both adults and children with:  Sleep apnea.  Chronic Obstructive Pulmonary Disease (COPD), a condition like emphysema.  Diseases which weaken the muscles of the chest such as muscular dystrophy or neurological diseases.  Other problems that cause breathing to be weak or difficult. USE OF CPAP OR BIPAP The respiratory therapist or technician will help you get used to wearing the mask. Some people feel claustrophobic (a trapped or closed in feeling) at first, because the mask needs to be fairly snug on your face.   It may help you to get used to the mask gradually, by first holding the mask loosely over your nose or mouth using a low pressure setting on the machine. Gradually the mask can be applied more snugly with increased pressure. You can also gradually increase the amount of time  the mask is used.  People with sleep apnea will use the mask and machine at night when they are sleeping. Others, like those with ALS or other breathing difficulties, may need the CPAP or BIPAP all the time.  If the first mask you try does not fit well, or is uncomfortable, there are other types and sizes that can be tried.  If you tend to breathe through your mouth, a chin strap may be applied to help keep your mouth closed (if you are using a nasal mask).  The CPAP and BIPAP machines have alarms that may sound if the  mask comes off or develops a leak.  You should not eat or drink while the CPAP or BIPAP is on. Food or fluids could get pushed into your lungs by the pressure of the CPAP or BIPAP. Sometimes CPAP or BIPAP machines are ordered for home use. If you are going to use the CPAP or BIPAP machine at home, follow these instructions  CPAP or BIPAP machines can be rented or purchased through home health care companies. There are many different brands of machines available. If you rent a machine before purchasing you may find which particular machine works well for you.  Ask questions if there is something you do not understand when picking out your machine.  Place your CPAP or BIPAP machine on a secure table or stand near an electrical outlet.  Know where the On/Off switch is.  Follow your doctor's instructions for how to set the pressure on your machine and when you should use it.  Do not smoke! Tobacco smoke residue can damage the machine. SEEK IMMEDIATE MEDICAL CARE IF:   You have redness or open areas around your nose or mouth.  You have trouble operating the CPAP or BIPAP machine.  You cannot tolerate wearing the CPAP or BIPAP mask.  You have any questions or concerns. Document Released: 08/14/2004 Document Revised: 02/08/2012 Document Reviewed: 11/13/2008 [facility] Patient Information 2014 [place], [place].

## 2013-07-26 NOTE — Progress Notes (Signed)
[facility]  Provider:  Melvyn Novas, M D  Referring Provider: Sheliah Hatch, MD Primary Care Physician:  Neena Rhymes, MD  Chief Complaint  Patient presents with  . Follow-up    1 year f/u rm 10    HPI:  Laura Stuart is a 67 y.o. female  Is seen here as a  revisit  from Dr. Beverely Low for sleep apnea .  Laura Stuart in here today for her yearly revisit. In the past, her primary care physician had been Dr. Nada Libman back at [facility], and referred her for a sleep study on 02-09-2008. The patient at that time had some daytime sleepiness snoring and complained of insomnia. She had also a medical history of trigeminal neuralgia and was treated already with carbamazepine at that time. She had and was the Epworth Sleepiness Scale at 5 points and to ask inventory at 5 points. She was placed on CPAP at a pressure of 7 cm water with an E. TR of 3 cm water. Residual AHI was 3.9 and average daily usage was 6 hours and 34 minutes. These other data from some larger cells and 13. The patient will bring her memory chip was her this afternoon to obtain an in office download. Please note that in the 2009 study AH I was 14.1 (sleep AHI was 53.3.  In June of last year Dr. Beverely Low was meanwhile her primary care physician the patient underwent a CBC with differential showing a low white blood cell count at 4.1. as expected as a side effect of Tegretol she had nor hyponatremia and normal other medical side effects. Earlier this year she had briefly an upper respiratory infection and couldn't he was for CPAP for a while on resumed using it after recovery.  Today his Epworth sleepiness score is at compliance the patient endorsed a GDS score of 2 points , her verbally reported compliance is high  as well.    Review of Systems: Out of a complete 14 system review, the patient complains of only the following symptoms, and all other reviewed systems are negative.  no longer  snoring, no longer insomnia, no nocturia. Ambien prn .  History   Social History  . Marital Status: Single    Spouse Name: N/A    Number of Children: 2  . Years of Education: N/A   Occupational History  .      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 Smoking status: Never Smoker   . Smokeless tobacco: Not on file  . Alcohol Use: Yes     Comment: 1 glass every 2 months; socially  . Drug Use: No  . Sexual Activity: Not on file   Other Topics Concern  . Not on file   Social History Narrative  . No narrative on file    Family History  Problem Relation Age of Onset  . Stroke Mother   . Brain cancer Mother   . Lung cancer Mother   . Heart attack Mother   . Emphysema Father   . Alzheimer's disease Sister   . Dementia Other     evident after a fall with a hip fracture    Past Medical History  Diagnosis Date  . Allergy   . Sleep apnea     using cpap,OSA  . Trigeminal neuralgia   . Trigeminal neuralgia   . Neuralgia     Trigeminal  .  Atypical facial pain     Past Surgical History  Procedure Laterality Date  . Tonsillectomy and adenoidectomy  1950  . Torn miniscus  2003  . Toes right foot  2010  . Vaginal delivery      X2  . Root canal  06/2013    Current Outpatient Prescriptions  Medication Sig Dispense Refill  . calcium-vitamin D (OSCAL WITH D) 500-200 MG-UNIT per tablet Take 1 tablet by mouth 2 (two) times daily.       . carbamazepine (TEGRETOL-XR) 200 MG 12 hr tablet Take 1 tablet (200 mg total) by mouth 3 (three) times daily.  270 tablet  0  . Halcinonide (HALOG) 0.1 % CREA Apply topically.        Marland Kitchen zolpidem (AMBIEN) 10 MG tablet Take 1 tablet (10 mg total) by mouth at bedtime as needed.  30 tablet  1   No current facility-administered medications for this visit.    Allergies as of 07/26/2013 - Review Complete 07/26/2013  Allergen Reaction Noted  . Penicillins  07/07/2011     Vitals: BP 121/71  Pulse 67  Resp 16  Ht 5\' 4"  (1.626 m)  Wt 174 lb (78.926 kg)  BMI 29.85 kg/m2 Last Weight:  Wt Readings from Last 1 Encounters:  07/26/13 174 lb (78.926 kg)   Last Height:   Ht Readings from Last 1 Encounters:  07/26/13 5\' 4"  (1.626 m)    Physical exam:  General: The patient is awake, alert and appears not in acute distress. The patient is well groomed. Head: Normocephalic, atraumatic. Neck is supple. Mallampati 2 , neck circumference: 14.5 . Cardiovascular:  Regular rate and rhythm, without  murmurs or carotid bruit, and without distended neck veins. Respiratory: Lungs are clear to auscultation. Skin:  Without evidence of edema, or rash Trunk: BMI is elevated and patient  has normal posture.  Neurologic exam : The patient is awake and alert, oriented to place and time.  Memory subjective  described as intact. There is a normal attention span & concentration ability. Speech is fluent without  dysarthria, dysphonia or aphasia.  Mood and affect are appropriate.  Cranial nerves: Pupils are equal and briskly reactive to light. Funduscopic exam without  evidence of pallor or edema.  Extraocular movements  in vertical and horizontal planes intact and without nystagmus. Visual fields by finger perimetry are intact. Hearing to finger rub intact.  Facial sensation intact to fine touch.  Facial motor strength is symmetric and tongue and uvula move midline.  Motor exam:   Normal tone and normal muscle bulk and symmetric normal strength in all extremities.  Sensory:  Fine touch, pinprick and vibration were normal  in all extremities. Proprioception is  normal.  Coordination: Rapid alternating movements in the fingers/hands is tested and normal. Finger-to-nose maneuver without evidence of ataxia, dysmetria or tremor.  Gait and station: Patient walks without assistive device and is able and assisted stool climb up to the exam table.  Strength within normal limits.  Stance is stable and normal. Steps are unfragmented. Romberg testing is normal.  Deep tendon reflexes: in the  upper and lower extremities are symmetric and intact. Babinski  downgoing.   Assessment:  OSA on CPAP.    Trigmeninal neuralgia controlled on Tegretol. CBC diff. Last year documented 4.1K WBC.  Plan:  Treatment plan and additional workup : continued use of CPAP - adjustment will be made if to nights download shows a higher AHI than 6/hr.

## 2013-09-06 ENCOUNTER — Telehealth: Payer: Self-pay | Admitting: General Practice

## 2013-09-06 NOTE — Telephone Encounter (Signed)
Pt notified of results of her recent Bone density (osteopenia) . States she currently takes ostacal 500mg  BID and D3 1000 iu daily. Is this ok or should she increase dosage?    Ok to call pt and LM on either phone.

## 2013-09-06 NOTE — Telephone Encounter (Signed)
This is fine 

## 2013-09-07 NOTE — Telephone Encounter (Signed)
Left a message per pt informing.

## 2013-09-11 ENCOUNTER — Encounter: Payer: Self-pay | Admitting: Family Medicine

## 2013-09-11 ENCOUNTER — Ambulatory Visit (INDEPENDENT_AMBULATORY_CARE_PROVIDER_SITE_OTHER): Payer: Medicare Other | Admitting: Family Medicine

## 2013-09-11 VITALS — BP 130/80 | HR 83 | Temp 98.5°F | Resp 17 | Wt 172.2 lb

## 2013-09-11 DIAGNOSIS — J209 Acute bronchitis, unspecified: Secondary | ICD-10-CM | POA: Insufficient documentation

## 2013-09-11 MED ORDER — BENZONATATE 200 MG PO CAPS: 200.0000 mg | ORAL_CAPSULE | Freq: Three times a day (TID) | ORAL | Status: DC | PRN

## 2013-09-11 MED ORDER — ALBUTEROL SULFATE (2.5 MG/3ML) 0.083% IN NEBU
2.5000 mg | INHALATION_SOLUTION | Freq: Once | RESPIRATORY_TRACT | Status: AC
  Administered 2013-09-11: 2.5 mg via RESPIRATORY_TRACT

## 2013-09-11 MED ORDER — PROMETHAZINE-DM 6.25-15 MG/5ML PO SYRP: 5.0000 mL | ORAL_SOLUTION | Freq: Four times a day (QID) | ORAL | Status: DC | PRN

## 2013-09-11 MED ORDER — AZITHROMYCIN 250 MG PO TABS: ORAL_TABLET | ORAL | Status: DC

## 2013-09-11 NOTE — Patient Instructions (Signed)
This is a bronchitis Start the Zpack as directed Use the cough syrup for nights Use the cough pills for daytime REST! Drink plenty of fluids Hang in there!!!

## 2013-09-11 NOTE — Assessment & Plan Note (Signed)
New.  Pt was able to stop coughing after neb tx.  Due to duration of illness, will start Zpack to cover for atypicals.  Cough meds prn.  Reviewed supportive care and red flags that should prompt return.  Pt expressed understanding and is in agreement w/ plan.

## 2013-09-11 NOTE — Progress Notes (Signed)
  Subjective:    Patient ID: Laura Stuart, female    DOB: 04-11-1946, 67 y.o.   MRN: [id number]  HPI URI- sxs started ~12 days ago after 'grandbaby sneezed in my face'.  No sore throat.  + chest congestion, head congestion.  Green nasal congestion, productive cough.  + laryngitis.  No fevers.  Denies SOB.  Taking Nyquil/Dayquil w/out relief.  No ear pain.   Review of Systems For ROS see HPI     Objective:   Physical Exam  Vitals reviewed. Constitutional: She appears well-developed and well-nourished. No distress.  HENT:  Head: Normocephalic and atraumatic.  TMs normal bilaterally Mild nasal congestion Throat w/out erythema, edema, or exudate  Eyes: Conjunctivae and EOM are normal. Pupils are equal, round, and reactive to light.  Neck: Normal range of motion. Neck supple.  Cardiovascular: Normal rate, regular rhythm, normal heart sounds and intact distal pulses.   No murmur heard. Pulmonary/Chest: Effort normal and breath sounds normal. No respiratory distress. She has no wheezes.  + hacking cough  Lymphadenopathy:    She has no cervical adenopathy.          Assessment & Plan:

## 2013-09-14 ENCOUNTER — Encounter: Payer: Self-pay | Admitting: Internal Medicine

## 2013-09-18 ENCOUNTER — Encounter: Payer: Self-pay | Admitting: Internal Medicine

## 2013-09-26 ENCOUNTER — Encounter: Payer: Self-pay | Admitting: Family Medicine

## 2013-10-10 ENCOUNTER — Encounter: Payer: Self-pay | Admitting: Family Medicine

## 2013-10-23 ENCOUNTER — Telehealth: Payer: Self-pay | Admitting: *Deleted

## 2013-10-23 MED ORDER — CARBAMAZEPINE ER 200 MG PO TB12: 200.0000 mg | ORAL_TABLET | Freq: Three times a day (TID) | ORAL | Status: DC

## 2013-10-23 NOTE — Telephone Encounter (Signed)
Rx has been sent.  (200mg )

## 2013-11-09 ENCOUNTER — Telehealth: Payer: Self-pay | Admitting: *Deleted

## 2013-11-09 NOTE — Telephone Encounter (Signed)
Dr Marina Goodell: pt is scheduled for recall colonoscopy 12/04/13.  Last colonoscopy normal with diverticulosis in 2007.  I have talked with pt and she does not have family hx of colon cancer and she is not having any GI problems.  Recall assessment sheet in EPIC says recall 7 years. I have placed paper chart on your desk for review.   Is she due for recall colon now or 2017?  Thanks, Olegario Messier

## 2013-11-13 NOTE — Telephone Encounter (Signed)
Noted. Pre-visit on 11-21-13, colon 12-04-13. Thanks.

## 2013-11-13 NOTE — Telephone Encounter (Signed)
The patient was brought back a little sooner than normal because of severe diverticulosis, which can make the reliability of the examination a bit less. Probably would proceed at this time, given that condition. Thanks checking

## 2013-11-21 ENCOUNTER — Ambulatory Visit (AMBULATORY_SURGERY_CENTER): Payer: Medicare Other | Admitting: *Deleted

## 2013-11-21 VITALS — Ht 65.5 in | Wt 176.0 lb

## 2013-11-21 DIAGNOSIS — Z1211 Encounter for screening for malignant neoplasm of colon: Secondary | ICD-10-CM

## 2013-11-21 MED ORDER — MOVIPREP 100 G PO SOLR: ORAL | Status: DC

## 2013-11-21 NOTE — Progress Notes (Signed)
No allergies to eggs or soy. No problems with anesthesia.  

## 2013-11-27 ENCOUNTER — Other Ambulatory Visit: Payer: Self-pay | Admitting: Family Medicine

## 2013-11-27 NOTE — Telephone Encounter (Signed)
Last OV 09-11-13 Med filled 05-04-13 #30 with 1  CSC on file, No UDS results.

## 2013-11-27 NOTE — Telephone Encounter (Signed)
Med filled and faxed.  

## 2013-11-28 ENCOUNTER — Encounter: Payer: Self-pay | Admitting: Internal Medicine

## 2013-12-04 ENCOUNTER — Ambulatory Visit (AMBULATORY_SURGERY_CENTER): Payer: Medicare HMO | Admitting: Internal Medicine

## 2013-12-04 ENCOUNTER — Encounter: Payer: Self-pay | Admitting: Internal Medicine

## 2013-12-04 VITALS — BP 129/74 | HR 65 | Temp 97.6°F | Resp 12 | Ht 65.5 in | Wt 176.0 lb

## 2013-12-04 DIAGNOSIS — D126 Benign neoplasm of colon, unspecified: Secondary | ICD-10-CM

## 2013-12-04 DIAGNOSIS — Z1211 Encounter for screening for malignant neoplasm of colon: Secondary | ICD-10-CM

## 2013-12-04 MED ORDER — SODIUM CHLORIDE 0.9 % IV SOLN: 500.0000 mL | INTRAVENOUS | Status: DC

## 2013-12-04 NOTE — Progress Notes (Signed)
Called to room to assist during endoscopic procedure.  Patient ID and intended procedure confirmed with present staff. Received instructions for my participation in the procedure from the performing physician.  

## 2013-12-04 NOTE — Patient Instructions (Signed)
YOU HAD AN ENDOSCOPIC PROCEDURE TODAY AT THE [facility]: Refer to the procedure report that was given to you for any specific questions about what was found during the examination.  If the procedure report does not answer your questions, please call your gastroenterologist to clarify.  If you requested that your care partner not be given the details of your procedure findings, then the procedure report has been included in a sealed envelope for you to review at your convenience later.  YOU SHOULD EXPECT: Some feelings of bloating in the abdomen. Passage of more gas than usual.  Walking can help get rid of the air that was put into your GI tract during the procedure and reduce the bloating. If you had a lower endoscopy (such as a colonoscopy or flexible sigmoidoscopy) you may notice spotting of blood in your stool or on the toilet paper. If you underwent a bowel prep for your procedure, then you may not have a normal bowel movement for a few days.  DIET: Your first meal following the procedure should be a light meal and then it is ok to progress to your normal diet.  A half-sandwich or bowl of soup is an example of a good first meal.  Heavy or fried foods are harder to digest and may make you feel nauseous or bloated.  Likewise meals heavy in dairy and vegetables can cause extra gas to form and this can also increase the bloating.  Drink plenty of fluids but you should avoid alcoholic beverages for 24 hours.  ACTIVITY: Your care partner should take you home directly after the procedure.  You should plan to take it easy, moving slowly for the rest of the day.  You can resume normal activity the day after the procedure however you should NOT DRIVE or use heavy machinery for 24 hours (because of the sedation medicines used during the test).    SYMPTOMS TO REPORT IMMEDIATELY: A gastroenterologist can be reached at any hour.  During normal business hours, 8:30 AM to 5:00 PM Monday through Friday,  call [phone].  After hours and on weekends, please call the GI answering service at [phone] who will take a message and have the physician on call contact you.   Following lower endoscopy (colonoscopy or flexible sigmoidoscopy):  Excessive amounts of blood in the stool  Significant tenderness or worsening of abdominal pains  Swelling of the abdomen that is new, acute  Fever of 100F or higher    FOLLOW UP: If any biopsies were taken you will be contacted by phone or by letter within the next 1-3 weeks.  Call your gastroenterologist if you have not heard about the biopsies in 3 weeks.  Our staff will call the home number listed on your records the next business day following your procedure to check on you and address any questions or concerns that you may have at that time regarding the information given to you following your procedure. This is a courtesy call and so if there is no answer at the home number and we have not heard from you through the emergency physician on call, we will assume that you have returned to your regular daily activities without incident.  SIGNATURES/CONFIDENTIALITY: You and/or your care partner have signed paperwork which will be entered into your electronic medical record.  These signatures attest to the fact that that the information above on your After Visit Summary has been reviewed and is understood.  Full responsibility of the confidentiality   of this discharge information lies with you and/or your care-partner.     

## 2013-12-04 NOTE — Progress Notes (Signed)
Report to pacu rn, vss, bbs=clear 

## 2013-12-04 NOTE — Op Note (Signed)
[facility]  [address] [place], [id number]   COLONOSCOPY PROCEDURE REPORT  PATIENT: Laura Stuart, Laura Stuart  MR#: [id number] BIRTHDATE: 02-10-1946 , 26  yrs. old GENDER: Female ENDOSCOPIST: Eustace Quail, MD REFERRED OM:[id number] Recall PROCEDURE DATE:  12/04/2013 PROCEDURE:   Colonoscopy with snare polypectomy x 1 First Screening Colonoscopy - Avg.  risk and is 50 yrs.  old or older - No.  Prior Negative Screening - Now for repeat screening. N/A  History of Adenoma - Now for follow-up colonoscopy & has been > or = to 3 yrs.  N/A  Polyps Removed Today? Yes. ASA CLASS:   Class II INDICATIONS:average risk screening.   Negative exam 2007 MEDICATIONS: MAC sedation, administered by CRNA and propofol (Diprivan) 270mg  IV  DESCRIPTION OF PROCEDURE:   After the risks benefits and alternatives of the procedure were thoroughly explained, informed consent was obtained.  A digital rectal exam revealed no abnormalities of the rectum.   The [id number]  endoscope was introduced through the anus and advanced to the cecum, which was identified by both the appendix and ileocecal valve. No adverse events experienced.   The quality of the prep was good, using MoviPrep  The instrument was then slowly withdrawn as the colon was fully examined.   COLON FINDINGS: A diminutive sessile polyp was found in the transverse colon.  A polypectomy was performed with a cold snare. The resection was complete and the polyp tissue was completely retrieved.   Severe diverticulosis was noted The finding was in the left colon.   The colon mucosa was otherwise normal.  Retroflexed views revealed no abnormalities. The time to cecum=3 minutes 01 seconds.  Withdrawal time=10 minutes 44 seconds.  The scope was withdrawn and the procedure completed. COMPLICATIONS: There were no complications.  ENDOSCOPIC IMPRESSION: 1.   Diminutive sessile polyp was found in the transverse colon; polypectomy  was performed with a cold snare 2.   Severe diverticulosis was noted in the left colon 3.   The colon mucosa was otherwise normal  RECOMMENDATIONS: 1. Repeat colonoscopy in 5 years if polyp adenomatous; otherwise 10 years   eSigned:  Eustace Quail, MD 12/04/2013 10:59 AM   cc: Midge Minium, MD and The Patient

## 2013-12-05 ENCOUNTER — Telehealth: Payer: Self-pay

## 2013-12-05 NOTE — Telephone Encounter (Signed)
  Follow up Call-  Call back number 12/04/2013  Post procedure Call Back phone  # [phone]  Permission to leave phone message Yes     Patient questions:  Do you have a fever, pain , or abdominal swelling? no Pain Score  0 *  Have you tolerated food without any problems? yes  Have you been able to return to your normal activities? yes  Do you have any questions about your discharge instructions: Diet   no Medications  no Follow up visit  no  Do you have questions or concerns about your Care? no  Actions: * If pain score is 4 or above: No action needed, pain <4.

## 2013-12-07 ENCOUNTER — Encounter: Payer: Self-pay | Admitting: Internal Medicine

## 2014-01-16 ENCOUNTER — Other Ambulatory Visit: Payer: Self-pay

## 2014-01-16 MED ORDER — CARBAMAZEPINE ER 200 MG PO TB12: 200.0000 mg | ORAL_TABLET | Freq: Three times a day (TID) | ORAL | Status: DC

## 2014-03-26 ENCOUNTER — Encounter (HOSPITAL_COMMUNITY): Payer: Self-pay | Admitting: Emergency Medicine

## 2014-03-26 ENCOUNTER — Emergency Department (HOSPITAL_COMMUNITY)
Admission: EM | Admit: 2014-03-26 | Discharge: 2014-03-26 | Disposition: A | Discharge status: Home / Self Care | Payer: Medicare HMO | Attending: Emergency Medicine | Admitting: Emergency Medicine

## 2014-03-26 DIAGNOSIS — Z880 Allergy status to penicillin: Secondary | ICD-10-CM | POA: Insufficient documentation

## 2014-03-26 DIAGNOSIS — H1130 Conjunctival hemorrhage, unspecified eye: Secondary | ICD-10-CM | POA: Insufficient documentation

## 2014-03-26 DIAGNOSIS — G500 Trigeminal neuralgia: Secondary | ICD-10-CM | POA: Insufficient documentation

## 2014-03-26 DIAGNOSIS — R1110 Vomiting, unspecified: Secondary | ICD-10-CM | POA: Insufficient documentation

## 2014-03-26 DIAGNOSIS — G4733 Obstructive sleep apnea (adult) (pediatric): Secondary | ICD-10-CM | POA: Insufficient documentation

## 2014-03-26 DIAGNOSIS — H1132 Conjunctival hemorrhage, left eye: Secondary | ICD-10-CM

## 2014-03-26 DIAGNOSIS — Z79899 Other long term (current) drug therapy: Secondary | ICD-10-CM | POA: Insufficient documentation

## 2014-03-26 MED ORDER — TETRACAINE HCL 0.5 % OP SOLN
1.0000 [drp] | Freq: Once | OPHTHALMIC | Status: AC
  Administered 2014-03-26: 1 [drp] via OPHTHALMIC
  Filled 2014-03-26: qty 2

## 2014-03-26 MED ORDER — FLUORESCEIN SODIUM 1 MG OP STRP
1.0000 | ORAL_STRIP | Freq: Once | OPHTHALMIC | Status: AC
  Administered 2014-03-26: 1 via OPHTHALMIC
  Filled 2014-03-26: qty 1

## 2014-03-26 NOTE — ED Provider Notes (Signed)
CSN: [id number]     Arrival date & time 03/26/14  1518 History  This chart was scribed for non-physician practitioner, Vernie Murders, PA-C working with [place], DO by Frederich Balding, ED scribe. This patient was seen in room WTR7/WTR7 and the patient's care was started at 4:55 PM.    Chief Complaint  Patient presents with  . Eye Problem   The history is provided by the patient. No language interpreter was used.   HPI Comments: Laura Stuart is a 68 y.o. female with a PMH of sleep apnea, trigeminal neuralgia, and allergies who presents to the Emergency Department complaining of left eye redness that started earlier this morning. Denies trauma to eye. Pt states her eye was normal when she woke up this morning the her husband saw her later and pointed out the redness. She states she went to the pharmacy to try and get drops but was told to go see a doctor. Pt does not wear contacts, but has glasses. She states she has one episode of emesis about one week ago and another yesterday. Denies fever, sneezing, coughing, eye pain, visual disturbance, eye pain, drainage, or photophobia. Patient not on blood thinners.    Past Medical History  Diagnosis Date  . Allergy   . Sleep apnea     using cpap,OSA  . Trigeminal neuralgia   . Neuralgia     Trigeminal  . Atypical facial pain    Past Surgical History  Procedure Laterality Date  . Tonsillectomy and adenoidectomy  1950  . Torn miniscus  2003  . Toes right foot  2010  . Vaginal delivery      X2  . Root canal  06/2013   Family History  Problem Relation Age of Onset  . Stroke Mother   . Brain cancer Mother   . Lung cancer Mother   . Heart attack Mother   . Emphysema Father   . Alzheimer's disease Sister   . Dementia Other     evident after a fall with a hip fracture  . Colon cancer Neg Hx    History  Substance Use Topics  . Smoking status: Never Smoker   . Smokeless tobacco: Never Used  . Alcohol Use: Yes     Comment: 1  glass every 2 months; socially   OB History   Grav Para Term Preterm Abortions TAB SAB Ect Mult Living                  Review of Systems  Constitutional: Negative for fever, chills, activity change, appetite change and fatigue.  HENT: Negative for facial swelling and sneezing.   Eyes: Positive for redness. Negative for photophobia, pain, discharge, itching and visual disturbance.  Respiratory: Negative for cough.   Gastrointestinal: Positive for vomiting. Negative for nausea and abdominal pain.  Neurological: Negative for dizziness, weakness, light-headedness and headaches.  All other systems reviewed and are negative.   Allergies  Penicillins  Home Medications   Prior to Admission medications   Medication Sig Start Date End Date Taking? Authorizing Provider  calcium-vitamin D (OSCAL WITH D) 500-200 MG-UNIT per tablet Take 1 tablet by mouth 2 (two) times daily.     Historical Provider, MD  carbamazepine (TEGRETOL-XR) 200 MG 12 hr tablet Take 1 tablet (200 mg total) by mouth 3 (three) times daily. 01/16/14   Asencion Partridge Dohmeier, MD  cholecalciferol (VITAMIN D) 1000 UNITS tablet Take 1,000 Units by mouth daily.    Historical Provider, MD  Halcinonide (HALOG) 0.1 %  CREA Apply topically.      Historical Provider, MD  zolpidem (AMBIEN) 10 MG tablet TAKE 1 TABLET BY MOUTH EVERY NIGHT AT BEDTIME 11/27/13   Midge Minium, MD   BP 146/70  Pulse 80  Temp(Src) 98.4 F (36.9 C) (Oral)  Resp 18  SpO2 99%  Filed Vitals:   03/26/14 1615 03/26/14 1758  BP: 146/70 132/67  Pulse: 80 68  Temp: 98.4 F (36.9 C)   TempSrc: Oral   Resp: 18 20  SpO2: 99% 100%    Physical Exam  Nursing note and vitals reviewed. Constitutional: She is oriented to person, place, and time. She appears well-developed and well-nourished. No distress.  HENT:  Head: Normocephalic and atraumatic.  Right Ear: Tympanic membrane, external ear and ear canal normal.  Left Ear: Tympanic membrane, external ear and  ear canal normal.  Nose: Nose normal.  Mouth/Throat: Oropharynx is clear and moist. No oropharyngeal exudate.  Tympanic membranes gray and translucent bilaterally with no erythema, edema, or hemotympanum.  No mastoid or tragal tenderness bilaterally. No erythema to the posterior pharynx. Tonsils without edema or exudates. Uvula midline. No trismus. No difficulty controlling secretions.   Eyes: EOM are normal. Pupils are equal, round, and reactive to light. Right eye exhibits no discharge and no hordeolum. No foreign body present in the right eye. Left eye exhibits no discharge and no hordeolum. No foreign body present in the left eye. Left conjunctiva has a hemorrhage.  Fundoscopic exam:      The right eye shows no AV nicking, no hemorrhage and no papilledema.       The left eye shows no AV nicking, no hemorrhage and no papilledema.    Sclera with erythema throughout on the medial portion of the left eye. No left eye drainage, eyelid masses, foreign bodies, hyphema, or pain with eye movement.   Neck: Normal range of motion. Neck supple. No tracheal deviation present.  No cervical lymphadenopathy. No nuchal rigidity.   Cardiovascular: Normal rate, regular rhythm and normal heart sounds.   Pulmonary/Chest: Effort normal and breath sounds normal. No respiratory distress. She has no wheezes. She has no rhonchi. She has no rales.  Abdominal: Soft. She exhibits no distension. There is no tenderness.  Musculoskeletal: Normal range of motion.  Neurological: She is alert and oriented to person, place, and time.  Skin: Skin is warm and dry.  Psychiatric: She has a normal mood and affect. Her behavior is normal.    ED Course  Procedures (including critical care time)  DIAGNOSTIC STUDIES: Oxygen Saturation is 99% on RA, normal by my interpretation.    COORDINATION OF CARE: 5:01 PM-Discussed treatment plan which includes checking for corneal abrasion or ulceration with pt at bedside and pt agreed to  plan.   Labs Review Labs Reviewed - No data to display  Imaging Review No results found.   EKG Interpretation None      MDM   Laura Stuart is a 68 y.o. female with a PMH of sleep apnea, trigeminal neuralgia, and allergies who presents to the Emergency Department complaining of left eye redness that started earlier this morning.  Rechecks  5:15 PM = Visual acuity 20/25 both eyes  5:45 PM = Woods lamp exam revealed no increased uptake or corneal abrasion on left eye. Left eye pressures with tono-pen WNL.    Etiology of eye redness likely due a subconjunctival hemorrhage of the left eye. Possibly from forceful episodes of emesis this week. No evidence of a foreign body,  corneal abrasion, or infectious process. Patient afebrile and non-toxic in appearance. Visual acuity intact. Patient instructed to follow-up with her PCP if symptoms are not improving or worsening. Return precautions, discharge instructions, and follow-up was discussed with the patient before discharge.    Discharge Medication List as of 03/26/2014  5:41 PM      Final impressions: 1. Subconjunctival hemorrhage of left eye      Mercy Moore PA-C    I personally performed the services described in this documentation, which was scribed in my presence. The recorded information has been reviewed and is accurate.  Lucila Maine, PA-C 03/27/14 [phone]

## 2014-03-26 NOTE — ED Notes (Signed)
Patient states that around 10 am her husband asked her what's wrong with your eye? When she looked in the mirror it was very red. She denies trauma or rubbing the eye. She states that when she awoke this morning and washed her face her eye was perfectly normal. She does not wear contact lenses. She states that she felt some mild pressure in her head. She went to urgent care but there was no one there who could see her at the time. She denies loss of vision.

## 2014-03-26 NOTE — Discharge Instructions (Signed)
- Return to the emergency department if you develop any changing/worsening condition, blurry vision, severe headache, repeated vomiting, loss of vision, eye drainage of pus, pain with eye movement, fever, or any other concerns (please read additional information regarding your condition below)   Subconjunctival Hemorrhage A subconjunctival hemorrhage is a bright red patch covering a portion of the white of the eye. The white part of the eye is called the sclera, and it is covered by a thin membrane called the conjunctiva. This membrane is clear, except for tiny blood vessels that you can see with the naked eye. When your eye is irritated or inflamed and becomes red, it is because the vessels in the conjunctiva are swollen. Sometimes, a blood vessel in the conjunctiva can break and bleed. When this occurs, the blood builds up between the conjunctiva and the sclera, and spreads out to create a red area. The red spot may be very small at first. It may then spread to cover a larger part of the surface of the eye, or even all of the visible white part of the eye. In almost all cases, the blood will go away and the eye will become white again. Before completely dissolving, however, the red area may spread. It may also become brownish-yellow in color, before going away. If a lot of blood collects under the conjunctiva, it may look like a bulge on the surface of the eye. This looks scary, but it will also eventually flatten out and go away. Subconjunctival hemorrhages do not cause pain, but if swollen, may cause a feeling of irritation. There is no effect on vision.  CAUSES   The most common cause is mild trauma (rubbing the eye, irritation).  Subconjunctival hemorrhages can happen because of coughing or straining (lifting heavy objects), vomiting, or sneezing.  In some cases, your doctor may want to check your blood pressure. High blood pressure can also cause a sunconjunctival hemorrhage.  Severe trauma or  blunt injuries.  Diseases that affect blood clotting (hemophilia, leukemia).  Abnormalities of blood vessels behind the eye (carotid cavernous sinus fistula).  Tumors behind the eye.  Certain drugs (aspirin, coumadin, heparin).  Recent eye surgery. HOME CARE INSTRUCTIONS   Do not worry about the appearance of your eye. You may continue your usual activities.  Often, follow-up is not necessary. SEEK MEDICAL CARE IF:   Your eye becomes painful.  The bleeding does not disappear within 3 weeks.  Bleeding occurs elsewhere, for example, under the skin, in the mouth, or in the other eye.  You have recurring subconjunctival hemorrhages. SEEK IMMEDIATE MEDICAL CARE IF:   Your vision changes or you have difficulty seeing.  You develop severe headache, persistent vomiting, confusion, or abnormal drowsiness (lethargy).  Your eye seems to bulge or protrude from the eye socket.  You notice the sudden appearance of bruises, or have spontaneous bleeding elsewhere on your body. Document Released: 11/16/2005 Document Revised: 02/08/2012 Document Reviewed: 10/14/2009 [employer], The Patient Information 2014 [place].   Emergency Department Resource Guide 1) Find a Doctor and Pay Out of Pocket Although you won't have to find out who is covered by your insurance plan, it is a good idea to ask around and get recommendations. You will then need to call the office and see if the doctor you have chosen will accept you as a new patient and what types of options they offer for patients who are self-pay. Some doctors offer discounts or will set up payment plans for their patients who  do not have insurance, but you will need to ask so you aren't surprised when you get to your appointment.  2) Contact Your Local Health Department Not all health departments have doctors that can see patients for sick visits, but many do, so it is worth a call to see if yours does. If you don't know where your local  health department is, you can check in your phone book. The CDC also has a tool to help you locate your state's health department, and many state websites also have listings of all of their local health departments.  3) Find a [facility] If your illness is not likely to be very severe or complicated, you may want to try a walk in clinic. These are popping up all over the country in pharmacies, drugstores, and shopping centers. They're usually staffed by nurse practitioners or physician assistants that have been trained to treat common illnesses and complaints. They're usually fairly quick and inexpensive. However, if you have serious medical issues or chronic medical problems, these are probably not your best option.  No Primary Care Doctor: - Call Health Connect at  [phone] - they can help you locate a primary care doctor that  accepts your insurance, provides certain services, etc. - Physician Referral Service- [phone]  Chronic Pain Problems: Organization         Address  Phone   Notes  [facility]  [phone] Patients need to be referred by their primary care doctor.   Medication Assistance: Organization         Address  Phone   Notes  [facility] Medication [facility] [address] [place], [place] [address] [phone] --Must be a resident of [facility] -- Must have NO insurance coverage whatsoever (no Medicaid/ Medicare, etc.) -- The pt. MUST have a primary care doctor that directs their care regularly and follows them in the community   MedAssist  [phone]   [employer]  [phone]    Agencies that provide inexpensive medical care: Organization         Address  Phone   Notes  [place]  [phone]   [facility]    [phone]   [facility] [place], [place] [address] [phone]   [place] [address], [place] [phone]   Planned Parenthood    [phone]   [facility]    [phone]   [place] [address], [place] Phone:  [phone], Fax:  [phone] Hours of Operation:  9 am - 6 pm, M-F.  Also accepts Medicaid/Medicare and self-pay.  [facility] for [address], [address], [place] Phone: [phone], Fax: [phone]. Hours of Operation:  8:30 am - 5:30 pm, M-F.  Also accepts Medicaid and self-pay.  [facility] [place] [address], [place] Phone: [phone]   [place], [place], [place] [phone], Ext. [phone] Mondays & Thursdays: 7-9 AM.  First 15 patients are seen on a first come, first serve basis.    [place] Providers:  Organization         Address  Phone   Notes  [facility] [address], [place] [phone] Also accepts self-pay patients.  [facility]  [address], [place]  [phone]   [facility] [address], [place] [phone]   [facility] [address] [place] [address], [place] [phone]) [phone]   Renaye RakersVeita Bland [address], [place]   [phone] Only accepts [place] Access [place]icaid patients after they have their name applied to their card.   Self-Pay (no insurance) in [facility]:  Organization         Address  Phone   Notes  Sickle Cell Patients, [facility] [address], [place] [phone]) [phone]   [facility] [address], [place] [phone]   [facility] [place]  [address], [place] [phone]) [phone]   [facility]/Dr. Osei-Bonsu  [address] [place] [address], [place] or [phone], [place] [phone] Phone number for both [place] and  [place] locations is the same.  [facility] [address], [place] [phone]   [facility] [place] [address] [place] [address], [place] or [address] [phone]) [phone]   [facility] [address], [place] [phone]) [phone], phone; [phone]) [phone], fax Sees patients 1st and 3rd Saturday of every month.  Must not qualify for public or private insurance (i.e. Medicaid, Medicare, [place] Health Choice, Veterans' Benefits)  Household income should be no more than 200% of the poverty level The clinic cannot treat you if you are pregnant or think you are pregnant  Sexually transmitted diseases are not treated at the clinic.    Dental Care: Organization         Address  Phone  Notes  [facility] [address], [place] [phone] Accepts children up to age 68 who are enrolled in [place]icaid or [place] Health Choice; pregnant women with a Medicaid card; and children who have applied for Medicaid or [place] Health Choice, but were declined, whose parents can pay a reduced fee at time of service.  [facility] [place]  [address], [place] [phone]) [phone] Accepts children up to age 68 who are enrolled in [place]icaid or [place] Health Choice; pregnant women with a Medicaid card; and children who have applied for Medicaid or [place] Health Choice, but were declined, whose parents can pay a reduced fee at time of service.  [facility]  [address], [place] [phone]) [phone] Patients are seen by appointment only. Walk-ins are not accepted. [facility] will see patients 68 years of age and older. Monday - Tuesday (8am-5pm) Most Wednesdays (8:30-5pm) $30 per visit, cash only  [facility]  [address], [facility] [phone]) [phone] Patients are seen by appointment only. Walk-ins are not accepted.  [facility] will see patients 68 years of age and older. One Wednesday Evening (Monthly: Volunteer Based).  $30 per visit, cash only  [employer] [facility] [facility]  [phone]) [phone] for adults; Children under age 474, call Graduate Pediatric Dentistry at [phone]. Children aged 514-14, please call [phone] to request a pediatric application.  Dental services are provided in all areas of dental care including fillings, crowns and bridges, complete and partial dentures, implants, gum treatment, root canals, and extractions. Preventive care is also provided. Treatment is provided to both adults and children. Patients  are selected via a lottery and there is often a waiting list.   [facility] [address], [place]  [phone]) [phone] [email or web address]   [facility] [address], [place], [place] [phone], Ext. [phone] Second and Fourth Thursday of each month, opens at 6:30 AM; Clinic ends at 9 AM.  Patients are seen on a first-come first-served basis, and a limited number are seen during each clinic.   [facility]  [address], [place], [place] [phone]) [phone]   Eligibility Requirements You must have lived in [place], [place], or [place] counties for at least the last three months.   You cannot be eligible for state or federal sponsored National Cityhealthcare insurance, including CIGNAVeterans Administration, [place]icaid, or [employer]Medicare.   You generally cannot be eligible for healthcare insurance through your employer.    How to apply: Eligibility screenings are held every Tuesday and Wednesday afternoon from 1:00 pm until 4:00 pm. You do not need an appointment for the interview!  [facility] [address], [place], [place] [phone]   [facility]  [phone]   [facility]  [phone]   [facility]  [phone]    Behavioral Health Resources in the  Community: Intensive Outpatient Programs Organization         Address  Phone  Notes  [facility] [address], [place], [place] [phone]   [facility] [address], [place], [place] [phone]   [facility] [address], [place], [place]  [phone]   [facility] [address],  [place], [place] [phone] or [phone]   Substance Abuse Resources Organization         Address  Phone  Notes  [facility]  [phone]   [facility]  [phone]   [facility]  [phone]   [facility]  [phone]   Residential & Outpatient Substance Abuse Program  [phone]   Psychological Services Organization         Address  Phone  Notes  [facility]  [phone]- [phone]   [facility]  [phone]- [phone]   [facility] [address], [place] [phone] or [phone]    Mobile Crisis Teams Organization         Address  Phone  Notes  [facility], Mobile Crisis Care Unit  [phone]   [facility]  [address] [place], [place] [phone]   Doristine LocksSharon DeEsch [address] [place] [phone]    Self-Help/Support Groups Organization         Address  Phone             Notes  [facility]oc[facility] of [place] - variety of support groups  336- [phone] Call for more information  Narcotics Anonymous (NA), Caring [facility] [address], [place]  2 meetings at this location   Statisticianesidential Treatment Programs Organization         Address  Phone  Notes  [facility] [address],    [place]  [phone]   [facility]  [address], [place] [phone], [place], [place] [phone]   [facility] [address], [place] [phone] Admissions: 8am-3pm M-F  [facility] [address]    [place], [place] [phone]   [facility] [phone]  [address] Jacinto Reap [place], [place]   [facility] [address]  [place], [place]   [facility] - Intensive Outpatient [address], [place], [place]   [facility] ([place].) [address].,  [place], [place] [phone] or [phone]   [facility] ([facility]S) [address], [place], [place] Accepts Medicaid  [facility] [place] [address],  [place] [phone] Substance Abuse/Addiction Treatment   [facility] Organization         Address  Phone  Notes  [facility]  [phone]   Domenic Schwab, PhD [address]lis Porta [place], [place]   [phone] or [phone]   [place] [address] [place], [place] [phone]   [address], [place], [place] [phone] Insurance/Medicaid/sponsorship through [facility] and Families [address]                                    [place], [place] [phone] Geraldine [address], [place] [phone]    Dr. Adele Schilder  [phone]   [facility] Dept. 1) [address] [place] 2) [place] 3)  [place] [address], [place] [phone]  [phone]   [place] [phone] or [phone] (After Hours)

## 2014-03-26 NOTE — ED Notes (Signed)
20/25 visual acuity both eyes.

## 2014-03-27 NOTE — ED Provider Notes (Signed)
Medical screening examination/treatment/procedure(s) were conducted as a shared visit with non-physician practitioner(s) and myself.  I personally evaluated the patient during the encounter.   EKG Interpretation None      Pt is a 68 y.o. F with history of trigeminal neuralgia, obstructive sleep apnea presents emergency department with left eye redness that started this morning. No eye pain, headache, vision changes, tearing or discharge. She has normal visual acuity. She does not wear contact lenses. Fluorescein staining is negative. Patient has a medial subconjunctival hemorrhage. No hyphema. She states she has had vomiting yesterday which may have triggered this hemorrhage. Discussed supportive care instructions. She is safe to be discharged home.    [place], DO 03/27/14 1502

## 2014-04-20 ENCOUNTER — Telehealth: Payer: Self-pay | Admitting: Family Medicine

## 2014-04-20 NOTE — Telephone Encounter (Signed)
Caller name:Mary Dormer Relation to pt: patient Call back number: [phone] Pharmacy:  Reason for call: patient called to see if she is up to date on her immunizations one in particular is whooping cough vaccination. Please advise

## 2014-04-20 NOTE — Telephone Encounter (Signed)
Called pt and LMOVM to call the insurance company to find out if they cover TDAP. Pt was also advised to schedule CPE with office. [place] for nurse visit for Tdap if insurance will cover.

## 2014-04-20 NOTE — Telephone Encounter (Signed)
Please advise 

## 2014-04-20 NOTE — Telephone Encounter (Signed)
Pt is due for upcoming CPE We have no record of when she had last tetanus (which is combined with the pertussis vaccine) She should check w/ her insurance company to determine whether Tdap is covered (most medicare will cover Td but not Tdap- and may need pay out of pocket)

## 2014-04-26 NOTE — Telephone Encounter (Signed)
Closing encounter until pt returns call.  

## 2014-07-25 ENCOUNTER — Encounter: Payer: Self-pay | Admitting: Neurology

## 2014-07-25 ENCOUNTER — Ambulatory Visit (INDEPENDENT_AMBULATORY_CARE_PROVIDER_SITE_OTHER): Payer: Medicare HMO | Admitting: Neurology

## 2014-07-25 VITALS — BP 105/71 | HR 72 | Resp 16 | Ht 65.0 in | Wt 172.0 lb

## 2014-07-25 DIAGNOSIS — Z5181 Encounter for therapeutic drug level monitoring: Secondary | ICD-10-CM

## 2014-07-25 DIAGNOSIS — G4733 Obstructive sleep apnea (adult) (pediatric): Secondary | ICD-10-CM

## 2014-07-25 DIAGNOSIS — S0431XS Injury of trigeminal nerve, right side, sequela: Secondary | ICD-10-CM

## 2014-07-25 DIAGNOSIS — Z9989 Dependence on other enabling machines and devices: Secondary | ICD-10-CM

## 2014-07-25 MED ORDER — ZOLPIDEM TARTRATE 10 MG PO TABS: 10.0000 mg | ORAL_TABLET | Freq: Every evening | ORAL | Status: DC | PRN

## 2014-07-25 NOTE — Patient Instructions (Signed)
Trigeminal Neuralgia  Trigeminal neuralgia is a nerve disorder that causes sudden attacks of severe facial pain. It is caused by damage to the trigeminal nerve, a major nerve in the face. It is more common in women and in the elderly, although it can also happen in younger patients. Attacks last from a few seconds to several minutes and can occur from a couple of times per year to several times per day. Trigeminal neuralgia can be a very distressing and disabling condition. Surgery may be needed in very severe cases if medical treatment does not give relief.  HOME CARE INSTRUCTIONS    If your caregiver prescribed medication to help prevent attacks, take as directed.   To help prevent attacks:   Chew on the unaffected side of the mouth.   Avoid touching your face.   Avoid blasts of hot or cold air.   Men may wish to grow a beard to avoid having to shave.  SEEK IMMEDIATE MEDICAL CARE IF:   Pain is unbearable and your medicine does not help.   You develop new, unexplained symptoms (problems).   You have problems that may be related to a medication you are taking.  Document Released: 11/13/2000 Document Revised: 02/08/2012 Document Reviewed: 09/13/2009  ExitCare Patient Information 2015 [employer]. This information is not intended to replace advice given to you by your health care provider. Make sure you discuss any questions you have with your health care provider.

## 2014-07-25 NOTE — Progress Notes (Signed)
[facility]  Provider:  Larey Seat, M D  Referring Provider: Midge Minium, MD Primary Care Physician:  [name], MD  Chief Complaint  Patient presents with  . Follow-up    Room   . Sleep Apnea    HPI:  Laura Stuart is a 68 y.o. female , who is seen here in a regular revisit from Dr. Birdie Riddle for sleep apnea .  Interval history :   The patient had a relapse , short duration , into trigeminal neuropathy , right face.  This lasted from March to April and was not as painful as in the a past. Now resoled. She continued on her established medications. S2 are asleep in the patient and dorsi Epworth sleepiness score at one point, fatigue severity at 14 points pulse very low.  The geriatric depression score was endorsed at zero.  Laura Stuart has been using CPAP now for 6.5 years , her study was in early 2009.  The patient's CPAP download documents a residual AHI of 4.5, average time of daily use of CPAP at 6 hours 56 minutes compliance is 91% for over 4 hours of use.  The patient uses CPAP at a  pressure of 7 cm water with 3 cm EPR.  This download encompassed t 90 days, from 04-26-14 07-24-49. The patient uses a nasal pillow .   Last visit note: CD  Laura Stuart in here today for her yearly revisit.  In the past, her primary care physician at [facility], had referred her for a sleep study on 02-09-2008.  The patient at that time had some daytime sleepiness snoring and complained of insomnia. She had also a medical history of trigeminal neuralgia and was treated already with carbamazepine at that time. She had and was the Epworth Sleepiness Scale at 5 points and to ask inventory at 5 points. She was placed on CPAP at a pressure of 7 cm water with an E. TR of 3 cm water. Residual AHI was 3.9 and average daily usage was 6 hours and 34 minutes. These other data from some larger cells and 13. The patient will bring her memory chip was her this  afternoon to obtain an in office download. Please note that in the 2009 study AH I was 14.1 (sleep AHI was 53.3. In June 2013,  Dr. Birdie Riddle was meanwhile her primary care physician  the patient underwent a CBC with differential showing a low white blood cell count at 4.1.  as expected as a side effect of Tegretol -she had no hyponatremia and no ataxia or diplopia as  medical side effects.  Earlier this year she had briefly an upper respiratory infection and couldn't he was for CPAP for a while on resumed using it after recovery. Today his Epworth sleepiness score is at compliance the patient endorsed a GDS score of 2 points , her verbally reported compliance is high  as well.    Review of Systems: Out of a complete 14 system review, the patient complains of only the following symptoms, and all other reviewed systems are negative.  no longer snoring, no longer insomnia, no nocturia. Ambien prn .  History   Social History  . Marital Status: Married    Spouse Name: [name]    Number of Children: 2  . Years of Education: Grad sch.   Occupational History  .      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 Smoking status: Never Smoker   . Smokeless tobacco: Never Used  . Alcohol Use: Yes     Comment: 1 glass every 2 months; socially  . Drug Use: No  . Sexual Activity: Not on file   Other Topics Concern  . Not on file   Social History Narrative   Patient is married [name]) and lives at home with her husband.   Patient has two adult children.   Patient is retired.   Patient has a Production designer, theatre/television/film.   Patient is right-handed.   Patient drinks 1 1/2 cups of coffee daily.    Family History  Problem Relation Age of Onset  . Stroke Mother   . Brain cancer Mother   . Lung cancer Mother   . Heart attack Mother   . Emphysema Father   . Alzheimer's disease Sister   . Dementia Other     evident after a fall  with a hip fracture  . Colon cancer Neg Hx     Past Medical History  Diagnosis Date  . Allergy   . Sleep apnea     using cpap,OSA  . Trigeminal neuralgia   . Neuralgia     Trigeminal  . Atypical facial pain     Past Surgical History  Procedure Laterality Date  . Tonsillectomy and adenoidectomy  1950  . Torn miniscus  2003  . Toes right foot  2010  . Vaginal delivery      X2  . Root canal  06/2013    Current Outpatient Prescriptions  Medication Sig Dispense Refill  . calcium-vitamin D (OSCAL WITH D) 500-200 MG-UNIT per tablet Take 1 tablet by mouth 2 (two) times daily.       . carbamazepine (TEGRETOL-XR) 200 MG 12 hr tablet Take 1 tablet (200 mg total) by mouth 3 (three) times daily.  270 tablet  1  . cholecalciferol (VITAMIN D) 1000 UNITS tablet Take 1,000 Units by mouth daily.      . Halcinonide (HALOG) 0.1 % CREA Apply topically.        Marland Kitchen zolpidem (AMBIEN) 10 MG tablet TAKE 1 TABLET BY MOUTH EVERY NIGHT AT BEDTIME as needed       No current facility-administered medications for this visit.    Allergies as of 07/25/2014 - Review Complete 07/25/2014  Allergen Reaction Noted  . Penicillins  07/07/2011    Vitals: BP 105/71  Pulse 72  Resp 16  Ht 5\' 5"  (1.651 m)  Wt 172 lb (78.019 kg)  BMI 28.62 kg/m2 Last Weight:  Wt Readings from Last 1 Encounters:  07/25/14 172 lb (78.019 kg)   Last Height:   Ht Readings from Last 1 Encounters:  07/25/14 5\' 5"  (1.651 m)    Physical exam:  General: The patient is awake, alert and appears not in acute distress. The patient is well groomed. Head: Normocephalic, atraumatic. Neck is supple. Mallampati 2 , neck circumference: 14.5 . Cardiovascular:  Regular rate and rhythm, without  murmurs or carotid bruit, and without distended neck veins. Respiratory: Lungs are clear to auscultation. Skin:  Without evidence of edema, or rash Trunk: BMI is elevated and patient  has normal posture.  Neurologic exam : The patient is awake  and alert, oriented to place and time.  Memory subjective  described as intact. There is a normal attention span & concentration ability. Speech is fluent without  dysarthria, dysphonia or aphasia.  Mood and affect are appropriate.  Cranial nerves: Pupils  are equal and briskly reactive to light. Funduscopic exam without  evidence of pallor or edema.  Extraocular movements  in vertical and horizontal planes intact and without nystagmus. Visual fields by finger perimetry are intact. Hearing to finger rub intact.  Facial sensation intact to fine touch.  Facial motor strength is symmetric and tongue and uvula move midline.  Motor exam:   Normal tone and normal muscle bulk and symmetric normal strength in all extremities.  Sensory:  Fine touch, pinprick and vibration were normal  in all extremities. Proprioception is  normal.  Coordination: Rapid alternating movements in the fingers/hands is tested and normal. Finger-to-nose maneuver without evidence of ataxia, dysmetria or tremor.  Gait and station: Patient walks without assistive device and is able and assisted stool climb up to the exam table.  Strength within normal limits. Stance is stable and normal. Steps are unfragmented. Romberg testing is normal.  Deep tendon reflexes: in the  upper and lower extremities are symmetric and intact. Babinski  downgoing.   Assessment:  OSA on CPAP.   Trigmeninal neuralgia controlled on Tegretol. CBC diff.  Continue use of CPAP -no adjustment will be made. RV in 6 month with NP. CBC diff.

## 2014-07-26 LAB — COMPREHENSIVE METABOLIC PANEL
ALBUMIN: 4.2 g/dL (ref 3.6–4.8)
ALK PHOS: 86 IU/L (ref 39–117)
ALT: 13 IU/L (ref 0–32)
AST: 20 IU/L (ref 0–40)
Albumin/Globulin Ratio: 1.9 (ref 1.1–2.5)
BUN / CREAT RATIO: 28 — AB (ref 11–26)
BUN: 19 mg/dL (ref 8–27)
CO2: 25 mmol/L (ref 18–29)
CREATININE: 0.69 mg/dL (ref 0.57–1.00)
Calcium: 8.9 mg/dL (ref 8.7–10.3)
Chloride: 107 mmol/L (ref 96–108)
GFR calc Af Amer: 103 mL/min/{1.73_m2} (ref >59)
GFR calc non Af Amer: 90 mL/min/{1.73_m2} (ref >59)
GLOBULIN, TOTAL: 2.2 g/dL (ref 1.5–4.5)
GLUCOSE: 92 mg/dL (ref 65–99)
Potassium: 4.6 mmol/L (ref 3.5–5.2)
Sodium: 140 mmol/L (ref 134–144)
Total Bilirubin: 0.2 mg/dL (ref 0.0–1.2)
Total Protein: 6.4 g/dL (ref 6.0–8.5)

## 2014-07-26 LAB — CBC WITH DIFFERENTIAL
BASOS: 1 %
Basophils Absolute: 0 10*3/uL (ref 0.0–0.2)
EOS ABS: 0.1 10*3/uL (ref 0.0–0.4)
EOS: 3 %
HCT: 36.5 % (ref 34.0–46.6)
Hemoglobin: 12.8 g/dL (ref 11.1–15.9)
LYMPHS: 24 %
Lymphocytes Absolute: 1.1 10*3/uL (ref 0.7–3.1)
MCH: 31.1 pg (ref 26.6–33.0)
MCHC: 35.1 g/dL (ref 31.5–35.7)
MCV: 89 fL (ref 79–97)
Monocytes Absolute: 0.4 10*3/uL (ref 0.1–0.9)
Monocytes: 9 %
NEUTROS PCT: 63 %
Neutrophils Absolute: 2.9 10*3/uL (ref 1.4–7.0)
PLATELETS: 258 10*3/uL (ref 150–379)
RBC: 4.12 x10E6/uL (ref 3.77–5.28)
RDW: 13.4 % (ref 12.3–15.4)
WBC: 4.6 10*3/uL (ref 3.4–10.8)

## 2014-08-21 ENCOUNTER — Other Ambulatory Visit: Payer: Self-pay | Admitting: *Deleted

## 2014-08-21 DIAGNOSIS — Z5181 Encounter for therapeutic drug level monitoring: Secondary | ICD-10-CM

## 2014-08-21 MED ORDER — CARBAMAZEPINE ER 200 MG PO TB12: 200.0000 mg | ORAL_TABLET | Freq: Three times a day (TID) | ORAL | Status: DC

## 2014-08-31 LAB — HM MAMMOGRAPHY

## 2014-09-03 ENCOUNTER — Encounter: Payer: Self-pay | Admitting: General Practice

## 2014-12-28 ENCOUNTER — Other Ambulatory Visit: Payer: Self-pay

## 2014-12-28 MED ORDER — ZOLPIDEM TARTRATE 10 MG PO TABS: 10.0000 mg | ORAL_TABLET | Freq: Every evening | ORAL | Status: DC | PRN

## 2014-12-28 NOTE — Telephone Encounter (Signed)
Rx signed and faxed.

## 2015-03-03 ENCOUNTER — Other Ambulatory Visit: Payer: Self-pay

## 2015-03-03 DIAGNOSIS — Z5181 Encounter for therapeutic drug level monitoring: Secondary | ICD-10-CM

## 2015-03-03 MED ORDER — CARBAMAZEPINE ER 200 MG PO TB12: 200.0000 mg | ORAL_TABLET | Freq: Three times a day (TID) | ORAL | Status: DC

## 2015-03-11 ENCOUNTER — Encounter: Payer: Self-pay | Admitting: Family Medicine

## 2015-03-11 ENCOUNTER — Ambulatory Visit (INDEPENDENT_AMBULATORY_CARE_PROVIDER_SITE_OTHER): Payer: PPO | Admitting: Family Medicine

## 2015-03-11 VITALS — BP 120/78 | HR 82 | Temp 97.9°F | Resp 16 | Wt 176.2 lb

## 2015-03-11 DIAGNOSIS — M25561 Pain in right knee: Secondary | ICD-10-CM | POA: Insufficient documentation

## 2015-03-11 MED ORDER — HYDROCODONE-ACETAMINOPHEN 5-325 MG PO TABS: 1.0000 | ORAL_TABLET | Freq: Four times a day (QID) | ORAL | Status: DC | PRN

## 2015-03-11 NOTE — Progress Notes (Signed)
   Subjective:    Patient ID: Laura Stuart, female    DOB: 1946-04-21, 69 y.o.   MRN: [id number]  HPI R knee pain- pt was standing still and when she turned she had 'excrutiating pain'.  Foot never left ground or pivoted.  No audible or feeling of a pop.  Not red.  + swelling.  Able to stand straight w/o pain but unable to bend w/o pain.  Pt able to peg leg while using a cane but unable to bend knee.  Currently wearing knee brace.  This occurred yesterday AM at 8-9.    Review of Systems For ROS see HPI     Objective:   Physical Exam  Constitutional: She is oriented to person, place, and time. She appears well-developed and well-nourished. No distress.  Cardiovascular: Intact distal pulses.   Musculoskeletal: She exhibits edema (R knee swelling) and tenderness (TTP over R anterior knee, unable to flex knee due to pain).  Neurological: She is alert and oriented to person, place, and time.  Vitals reviewed.         Assessment & Plan:

## 2015-03-11 NOTE — Assessment & Plan Note (Signed)
New.  Given twisting mechanism of injury, concern for meniscal injury or LCL.  Start pain meds prn.  Refer urgently to ortho.  Reviewed supportive care and red flags that should prompt return.  Pt expressed understanding and is in agreement w/ plan.

## 2015-03-11 NOTE — Progress Notes (Signed)
Pre visit review using our clinic review tool, if applicable. No additional management support is needed unless otherwise documented below in the visit note. 

## 2015-03-14 ENCOUNTER — Encounter: Payer: PPO | Admitting: Family Medicine

## 2015-03-15 ENCOUNTER — Telehealth: Payer: Self-pay | Admitting: Family Medicine

## 2015-03-15 NOTE — Telephone Encounter (Signed)
Pre Visit letter sent  °

## 2015-04-04 ENCOUNTER — Encounter: Payer: PPO | Admitting: Family Medicine

## 2015-07-08 ENCOUNTER — Other Ambulatory Visit: Payer: Self-pay

## 2015-07-08 MED ORDER — ZOLPIDEM TARTRATE 10 MG PO TABS: 10.0000 mg | ORAL_TABLET | Freq: Every evening | ORAL | Status: DC | PRN

## 2015-07-08 NOTE — Telephone Encounter (Signed)
Patient has annual appt scheduled this month

## 2015-07-23 ENCOUNTER — Telehealth: Payer: Self-pay

## 2015-07-23 NOTE — Telephone Encounter (Signed)
Called patient and left her message asking her if she can come on Wednesday to See [facility] instead of seeing Dr. Brett Fairy on Thursday. Advised patient to bring her CPAP machine or her card. If patient call back please move her to Megan's schedule on Wednesday. Thanks Hinton Dyer.

## 2015-07-24 ENCOUNTER — Ambulatory Visit: Payer: Medicare HMO | Admitting: Adult Health

## 2015-07-25 ENCOUNTER — Ambulatory Visit: Payer: Medicare Other | Admitting: Neurology

## 2015-07-25 ENCOUNTER — Ambulatory Visit: Payer: Medicare HMO | Admitting: Adult Health

## 2015-07-31 ENCOUNTER — Ambulatory Visit (INDEPENDENT_AMBULATORY_CARE_PROVIDER_SITE_OTHER): Payer: PPO | Admitting: Adult Health

## 2015-07-31 ENCOUNTER — Encounter: Payer: Self-pay | Admitting: Adult Health

## 2015-07-31 ENCOUNTER — Encounter (INDEPENDENT_AMBULATORY_CARE_PROVIDER_SITE_OTHER): Payer: Self-pay

## 2015-07-31 VITALS — BP 132/80 | HR 64 | Ht 65.0 in | Wt 170.0 lb

## 2015-07-31 DIAGNOSIS — G500 Trigeminal neuralgia: Secondary | ICD-10-CM

## 2015-07-31 DIAGNOSIS — G4733 Obstructive sleep apnea (adult) (pediatric): Secondary | ICD-10-CM

## 2015-07-31 DIAGNOSIS — G4730 Sleep apnea, unspecified: Secondary | ICD-10-CM

## 2015-07-31 NOTE — Progress Notes (Signed)
PATIENT: Laura Stuart DOB: 05/15/1946  REASON FOR VISIT: follow up- obstructive sleep apnea on CPAP. HISTORY FROM: patient  HISTORY OF PRESENT ILLNESS: Laura Stuart is a 69 year old female with a history of obstructive sleep apnea on CPAP. She returns today for a compliance download. The patient did bring her CPAP machine however she did not bring the cord in order for [place] to download a report. The patient states that she's been using the machine nightly. Her Epworth sleepiness score is 2 was previously 1 and fatigue severity score is 15 was previously 14. Patient also has a history of left-sided trigeminal neuralgia. She is currently on Tegretol and tolerates this medication well. She has not had any flareups over the last year. She returns today for an evaluation  HISTORY 07/25/14 [facility]): Laura Stuart is a 69 y.o. female , who is seen here in a regular revisit from Dr. Birdie Riddle for sleep apnea .  Interval history :   The patient had a relapse , short duration , into trigeminal neuropathy , right face.  This lasted from March to April and was not as painful as in the a past. Now resoled. She continued on her established medications.   The geriatric depression score was endorsed at zero.  Laura Stuart has been using CPAP now for 6.5 years , her study was in early 2009.  The patient's CPAP download documents a residual AHI of 4.5, average time of daily use of CPAP at 6 hours 56 minutes compliance is 91% for over 4 hours of use.  The patient uses CPAP at a pressure of 7 cm water with 3 cm EPR.  This download encompassed t 90 days, from 04-26-14 07-24-49. The patient uses a nasal pillow   REVIEW OF SYSTEMS: Out of a complete 14 system review of symptoms, the patient complains only of the following symptoms, and all other reviewed systems are negative.  ALLERGIES: Allergies  Allergen Reactions  . Penicillins     From allergy testing    HOME MEDICATIONS: Outpatient  Prescriptions Prior to Visit  Medication Sig Dispense Refill  . calcium-vitamin D (OSCAL WITH D) 500-200 MG-UNIT per tablet Take 1 tablet by mouth 2 (two) times daily.     . carbamazepine (TEGRETOL-XR) 200 MG 12 hr tablet Take 1 tablet (200 mg total) by mouth 3 (three) times daily. 270 tablet 1  . cholecalciferol (VITAMIN D) 1000 UNITS tablet Take 1,000 Units by mouth daily.    . Halcinonide (HALOG) 0.1 % CREA Apply topically.      Marland Kitchen zolpidem (AMBIEN) 10 MG tablet Take 1 tablet (10 mg total) by mouth at bedtime as needed for sleep. 30 tablet 0  . HYDROcodone-acetaminophen (NORCO/VICODIN) 5-325 MG per tablet Take 1 tablet by mouth every 6 (six) hours as needed for moderate pain. (Patient not taking: Reported on 07/31/2015) 20 tablet 0   No facility-administered medications prior to visit.    PAST MEDICAL HISTORY: Past Medical History  Diagnosis Date  . Allergy   . Sleep apnea     using cpap,OSA  . Trigeminal neuralgia   . Neuralgia     Trigeminal  . Atypical facial pain     PAST SURGICAL HISTORY: Past Surgical History  Procedure Laterality Date  . Tonsillectomy and adenoidectomy  1950  . Torn miniscus  2003  . Toes right foot  2010  . Vaginal delivery      X2  . Root canal  06/2013    FAMILY HISTORY: Family History  Problem Relation Age of Onset  . Stroke Mother   . Brain cancer Mother   . Lung cancer Mother   . Heart attack Mother   . Emphysema Father   . Alzheimer's disease Sister   . Dementia Other     evident after a fall with a hip fracture  . Colon cancer Neg Hx     SOCIAL HISTORY: Social History   Social History  . Marital Status: Married    Spouse Name: [name]  . Number of Children: 2  . Years of Education: Grad sch.   Occupational History  .      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 Smoking status: Never Smoker   . Smokeless tobacco: Never Used  . Alcohol Use: 0.0  oz/week    0 Standard drinks or equivalent per week     Comment: 1 glass every 2 months; socially  . Drug Use: No  . Sexual Activity: Not on file   Other Topics Concern  . Not on file   Social History Narrative   Patient is married [name]) and lives at home with her husband.   Patient has two adult children.   Patient is retired.   Patient has a Production designer, theatre/television/film.   Patient is right-handed.   Patient drinks 1 1/2 cups of coffee daily.      PHYSICAL EXAM  Filed Vitals:   07/31/15 1045  BP: 132/80  Pulse: 64  Height: 5\' 5"  (1.651 m)  Weight: 170 lb (77.111 kg)   Body mass index is 28.29 kg/(m^2).  Generalized: Well developed, in no acute distress  Neck: Circumference 13 inches, Mallampati 4+  Neurological examination  Mentation: Alert oriented to time, place, history taking. Follows all commands speech and language fluent Cranial nerve II-XII: Pupils were equal round reactive to light. Extraocular movements were full, visual field were full on confrontational test. Facial sensation and strength were normal. Uvula tongue midline. Head turning and shoulder shrug  were normal and symmetric. Motor: The motor testing reveals 5 over 5 strength of all 4 extremities. Good symmetric motor tone is noted throughout.  Sensory: Sensory testing is intact to soft touch on all 4 extremities. No evidence of extinction is noted.  Coordination: Cerebellar testing reveals good finger-nose-finger and heel-to-shin bilaterally.  Gait and station: Gait is normal.  Reflexes: Deep tendon reflexes are symmetric and normal bilaterally.   DIAGNOSTIC DATA (LABS, IMAGING, TESTING) - I reviewed patient records, labs, notes, testing and imaging myself where available.         ASSESSMENT AND PLAN 69 y.o. year old female  has a past medical history of Allergy; Sleep apnea; Trigeminal neuralgia; Neuralgia; and Atypical facial pain. here with:  1. Obstructive sleep apnea on CPAP 2. Left-sided  trigeminal neuralgia  Patient assures me that she uses her machine nightly. She will bring her machine with the cord back to the sleep lab in the coming days for [place] to obtain a download. Trigeminal neuralgia has been controlled with Tegretol. She will continue this. Patient advised that if her symptoms worsen or she develops new symptoms she should let us know. She will follow-up in one year or sooner if needed.  Ward Givens, MSN, NP-C 07/31/2015, 11:02 AM [facility] [address] [place], [place] [address] [phone]

## 2015-07-31 NOTE — Patient Instructions (Signed)
Please bring CPAP with the cord to the sleep lab in the coming days for [place] to obtain a download. If your symptoms worsen or you develop new symptoms please let us know.

## 2015-07-31 NOTE — Progress Notes (Signed)
I agree with the assessment and plan as directed by NP .The patient is known to me .   Kieran Nachtigal, MD  

## 2015-08-02 ENCOUNTER — Telehealth: Payer: Self-pay | Admitting: Neurology

## 2015-08-02 NOTE — Telephone Encounter (Signed)
Message to Erasmo Downer that she brought her CPAP machine. It could not be downloaded today. She talked with Robin in the sleep lab.  She will be back next week. Best number is [phone]

## 2015-08-02 NOTE — Telephone Encounter (Signed)
Kritin and myself on vacation, refer to sleep lab. Thank You.

## 2015-08-19 ENCOUNTER — Telehealth: Payer: Self-pay

## 2015-08-20 ENCOUNTER — Ambulatory Visit (INDEPENDENT_AMBULATORY_CARE_PROVIDER_SITE_OTHER): Payer: PPO | Admitting: Family Medicine

## 2015-08-20 ENCOUNTER — Encounter: Payer: Self-pay | Admitting: Family Medicine

## 2015-08-20 VITALS — BP 128/80 | HR 67 | Temp 98.0°F | Resp 16 | Wt 176.4 lb

## 2015-08-20 DIAGNOSIS — Z23 Encounter for immunization: Secondary | ICD-10-CM | POA: Diagnosis not present

## 2015-08-20 DIAGNOSIS — E663 Overweight: Secondary | ICD-10-CM | POA: Diagnosis not present

## 2015-08-20 DIAGNOSIS — G4733 Obstructive sleep apnea (adult) (pediatric): Secondary | ICD-10-CM

## 2015-08-20 DIAGNOSIS — K639 Disease of intestine, unspecified: Secondary | ICD-10-CM

## 2015-08-20 DIAGNOSIS — M8580 Other specified disorders of bone density and structure, unspecified site: Secondary | ICD-10-CM

## 2015-08-20 DIAGNOSIS — G4730 Sleep apnea, unspecified: Secondary | ICD-10-CM

## 2015-08-20 DIAGNOSIS — Z0000 Encounter for general adult medical examination without abnormal findings: Secondary | ICD-10-CM | POA: Diagnosis not present

## 2015-08-20 DIAGNOSIS — R198 Other specified symptoms and signs involving the digestive system and abdomen: Secondary | ICD-10-CM | POA: Insufficient documentation

## 2015-08-20 LAB — BASIC METABOLIC PANEL
BUN: 21 mg/dL (ref 6–23)
CALCIUM: 9.9 mg/dL (ref 8.4–10.5)
CO2: 32 mEq/L (ref 19–32)
Chloride: 109 mEq/L (ref 96–112)
Creatinine, Ser: 0.75 mg/dL (ref 0.40–1.20)
GFR: 81.36 mL/min (ref >60.00)
Glucose, Bld: 104 mg/dL — ABNORMAL HIGH (ref 70–99)
Potassium: 5.2 mEq/L — ABNORMAL HIGH (ref 3.5–5.1)
Sodium: 146 mEq/L — ABNORMAL HIGH (ref 135–145)

## 2015-08-20 LAB — CBC WITH DIFFERENTIAL/PLATELET
BASOS ABS: 0 10*3/uL (ref 0.0–0.1)
BASOS PCT: 0.8 % (ref 0.0–3.0)
Eosinophils Absolute: 0.1 10*3/uL (ref 0.0–0.7)
Eosinophils Relative: 2.3 % (ref 0.0–5.0)
HEMATOCRIT: 39.6 % (ref 36.0–46.0)
HEMOGLOBIN: 13.1 g/dL (ref 12.0–15.0)
LYMPHS PCT: 24.4 % (ref 12.0–46.0)
Lymphs Abs: 1.2 10*3/uL (ref 0.7–4.0)
MCHC: 33.1 g/dL (ref 30.0–36.0)
MCV: 91.7 fl (ref 78.0–100.0)
MONO ABS: 0.4 10*3/uL (ref 0.1–1.0)
Monocytes Relative: 7.5 % (ref 3.0–12.0)
Neutro Abs: 3.1 10*3/uL (ref 1.4–7.7)
Neutrophils Relative %: 65 % (ref 43.0–77.0)
Platelets: 247 10*3/uL (ref 150.0–400.0)
RBC: 4.32 Mil/uL (ref 3.87–5.11)
RDW: 14 % (ref 11.5–15.5)
WBC: 4.8 10*3/uL (ref 4.0–10.5)

## 2015-08-20 LAB — LIPID PANEL
CHOL/HDL RATIO: 3
Cholesterol: 216 mg/dL — ABNORMAL HIGH (ref 0–200)
HDL: 62.1 mg/dL (ref >39.00)
LDL Cholesterol: 127 mg/dL — ABNORMAL HIGH (ref 0–99)
NONHDL: 154.04
TRIGLYCERIDES: 137 mg/dL (ref 0.0–149.0)
VLDL: 27.4 mg/dL (ref 0.0–40.0)

## 2015-08-20 LAB — HEPATIC FUNCTION PANEL
ALBUMIN: 4 g/dL (ref 3.5–5.2)
ALT: 13 U/L (ref 0–35)
AST: 19 U/L (ref 0–37)
Alkaline Phosphatase: 80 U/L (ref 39–117)
BILIRUBIN TOTAL: 0.4 mg/dL (ref 0.2–1.2)
Bilirubin, Direct: 0.1 mg/dL (ref 0.0–0.3)
Total Protein: 6.8 g/dL (ref 6.0–8.3)

## 2015-08-20 LAB — TSH: TSH: 2.82 u[IU]/mL (ref 0.35–4.50)

## 2015-08-20 LAB — VITAMIN D 25 HYDROXY (VIT D DEFICIENCY, FRACTURES): VITD: 31.93 ng/mL (ref 30.00–100.00)

## 2015-08-20 NOTE — Telephone Encounter (Signed)
Pre Visit call completed. 

## 2015-08-20 NOTE — Assessment & Plan Note (Signed)
Chronic problem for pt.  Continues to gain weight.  Pt intends to resume exercising more regularly.  Encouraged healthy food choices.  Check labs to risk stratify.  Will follow.

## 2015-08-20 NOTE — Progress Notes (Signed)
   Subjective:    Patient ID: Laura Stuart, female    DOB: Nov 30, 1946, 69 y.o.   MRN: [id number]  HPI Here today for CPE.  Risk Factors: Overweight- ongoing issue for pt, has gained 6 lbs since last visit.  Admits to limited exercise recently but plans to do better.  'i've been eating fairly well'. Osteopenia- due for DEXA.  Currently on Ca and Vit D OSA- using CPAP regularly, following w/ Dr Dohmeier Bowel irregularity- pt reports 3-4x/month she will have increased abdominal cramping and stool frequency but denies diarrhea.  Has had a few occurences of simultaneous vomiting w/ bowel movement- 1st occuring 18 months ago.  Has occurred 5x since.  Pt always reports this occurs after eating and w/ bearing down for BM. Physical Activity: exercising regularly Fall Risk: low Depression: denies Hearing: normal to conversational tones and whispered voice at 6 ft ADL's: independent Cognitive: normal linear thought process, memory and attention intact Home Safety: safe at home, lives w/ husband Height, Weight, BMI, Visual Acuity: see vitals, vision corrected to 20/20 w/ glasses Counseling: UTD on colonoscopy (due 2020), has mammo scheduled Oct 4, needs DEXA order Care team reviewed and updated w/ pt Labs Ordered: See A&P Care Plan: See A&P    Review of Systems Patient reports no vision/ hearing changes, adenopathy,fever, weight change,  persistant/recurrent hoarseness , swallowing issues, chest pain, palpitations, edema, persistant/recurrent cough, hemoptysis, dyspnea (rest/exertional/paroxysmal nocturnal), gastrointestinal bleeding (melena, rectal bleeding), abdominal pain, significant heartburn, GU symptoms (dysuria, hematuria, incontinence), Gyn symptoms (abnormal  bleeding, pain),  syncope, focal weakness, memory loss, numbness & tingling, skin/hair/nail changes, abnormal bruising or bleeding, anxiety, or depression.     Objective:   Physical Exam General Appearance:    Alert,  cooperative, no distress, appears stated age  Head:    Normocephalic, without obvious abnormality, atraumatic  Eyes:    PERRL, conjunctiva/corneas clear, EOM's intact, fundi    benign, both eyes  Ears:    Normal TM's and external ear canals, both ears  Nose:   Nares normal, septum midline, mucosa normal, no drainage    or sinus tenderness  Throat:   Lips, mucosa, and tongue normal; teeth and gums normal  Neck:   Supple, symmetrical, trachea midline, no adenopathy;    Thyroid: no enlargement/tenderness/nodules  Back:     Symmetric, no curvature, ROM normal, no CVA tenderness  Lungs:     Clear to auscultation bilaterally, respirations unlabored  Chest Wall:    No tenderness or deformity   Heart:    Regular rate and rhythm, S1 and S2 normal, no murmur, rub   or gallop  Breast Exam:    Deferred to mammo  Abdomen:     Soft, non-tender, bowel sounds active all four quadrants,    no masses, no organomegaly  Genitalia:    Deferred  Rectal:    Extremities:   Extremities normal, atraumatic, no cyanosis or edema  Pulses:   2+ and symmetric all extremities  Skin:   Skin color, texture, turgor normal, no rashes or lesions  Lymph nodes:   Cervical, supraclavicular, and axillary nodes normal  Neurologic:   CNII-XII intact, normal strength, sensation and reflexes    throughout          Assessment & Plan:

## 2015-08-20 NOTE — Progress Notes (Signed)
Pre visit review using our clinic review tool, if applicable. No additional management support is needed unless otherwise documented below in the visit note. 

## 2015-08-20 NOTE — Assessment & Plan Note (Signed)
Chronic problem.  Pt reports compliance w/ CPAP.  Following w/ Dr Brett Fairy.  Will follow along and assist as able.

## 2015-08-20 NOTE — Assessment & Plan Note (Signed)
Due for DEXA.  Order entered.  Check Vit D level.  Replete prn.

## 2015-08-20 NOTE — Assessment & Plan Note (Signed)
New.  Reviewed that the vomiting she will intermittently have during BM is most likely a vagal reaction to bearing down.  No need for additional work up at this time but will refer back to GI if symptoms persist.  Pt expressed understanding and is in agreement w/ plan.

## 2015-08-20 NOTE — Assessment & Plan Note (Addendum)
Pt's PE WNL w/ exception of obesity.  UTD on colonoscopy.  Has mammo scheduled.  DEXA order placed today.  Written screening schedule updated and given to pt.  Check labs.  Anticipatory guidance provided.  Prevnar and Flu given today.

## 2015-08-20 NOTE — Patient Instructions (Signed)
Follow up in 1 year or as needed We'll notify you of your lab results and make any changes if needed Continue to work on healthy diet and regular exercise The vomiting during BM is most likely due to bearing down and causing a vagal response- no further workup is needed The order for your bone density is in! You are good on colonoscopy until 2020!  Phebe Colla! Call with any questions or concerns If you want to join [place] at the new [place] office, any scheduled appointments will automatically transfer and we will see you at [address] [place] [address] [place], [place] [address]  Have a great fall!!!

## 2015-08-21 ENCOUNTER — Encounter: Payer: Self-pay | Admitting: General Practice

## 2015-08-26 ENCOUNTER — Other Ambulatory Visit: Payer: Self-pay

## 2015-08-26 DIAGNOSIS — Z5181 Encounter for therapeutic drug level monitoring: Secondary | ICD-10-CM

## 2015-08-26 MED ORDER — ZOLPIDEM TARTRATE 10 MG PO TABS: 10.0000 mg | ORAL_TABLET | Freq: Every evening | ORAL | Status: DC | PRN

## 2015-08-26 MED ORDER — CARBAMAZEPINE ER 200 MG PO TB12: 200.0000 mg | ORAL_TABLET | Freq: Three times a day (TID) | ORAL | Status: DC

## 2015-09-03 LAB — HM DEXA SCAN

## 2015-09-03 LAB — HM MAMMOGRAPHY

## 2015-09-11 ENCOUNTER — Encounter: Payer: Self-pay | Admitting: General Practice

## 2015-09-24 ENCOUNTER — Encounter: Payer: Self-pay | Admitting: General Practice

## 2015-10-14 ENCOUNTER — Other Ambulatory Visit: Payer: Self-pay

## 2015-10-14 MED ORDER — ZOLPIDEM TARTRATE 10 MG PO TABS: 10.0000 mg | ORAL_TABLET | Freq: Every evening | ORAL | Status: DC | PRN

## 2015-10-14 NOTE — Telephone Encounter (Signed)
Rx signed and faxed.

## 2015-11-14 ENCOUNTER — Telehealth: Payer: Self-pay | Admitting: Family Medicine

## 2015-11-14 NOTE — Telephone Encounter (Signed)
Caller name: Self   Can be reached: [phone]  Reason for call: Patient has questions about results of Bone Density Test

## 2015-11-15 NOTE — Telephone Encounter (Signed)
Spoke with pt and advised that it is ok to take her new calcium and vitamin d supplement (1200-100) as well as her regular vitamin d supplement (1000iu) daily.

## 2016-02-12 ENCOUNTER — Other Ambulatory Visit: Payer: Self-pay

## 2016-02-12 DIAGNOSIS — Z5181 Encounter for therapeutic drug level monitoring: Secondary | ICD-10-CM

## 2016-02-12 MED ORDER — CARBAMAZEPINE ER 200 MG PO TB12: 200.0000 mg | ORAL_TABLET | Freq: Three times a day (TID) | ORAL | Status: DC

## 2016-04-28 DIAGNOSIS — G4733 Obstructive sleep apnea (adult) (pediatric): Secondary | ICD-10-CM | POA: Diagnosis not present

## 2016-07-30 ENCOUNTER — Telehealth: Payer: Self-pay

## 2016-07-30 ENCOUNTER — Ambulatory Visit (INDEPENDENT_AMBULATORY_CARE_PROVIDER_SITE_OTHER): Payer: PPO | Admitting: Adult Health

## 2016-07-30 ENCOUNTER — Encounter: Payer: Self-pay | Admitting: Adult Health

## 2016-07-30 VITALS — BP 108/66 | HR 78 | Wt 170.0 lb

## 2016-07-30 DIAGNOSIS — G4733 Obstructive sleep apnea (adult) (pediatric): Secondary | ICD-10-CM | POA: Diagnosis not present

## 2016-07-30 DIAGNOSIS — Z9989 Dependence on other enabling machines and devices: Principal | ICD-10-CM

## 2016-07-30 NOTE — Telephone Encounter (Signed)
Patient returned Kristen's call, relayed message below.

## 2016-07-30 NOTE — Telephone Encounter (Signed)
Order sent to [facility] °

## 2016-07-30 NOTE — Progress Notes (Signed)
PATIENT: Laura Stuart DOB: Aug 25, 1946  REASON FOR VISIT: follow up- OSA on CPAP HISTORY FROM: patient  HISTORY OF PRESENT ILLNESS: Laura Stuart is a 70 year old female with a history of obstructive sleep apnea on CPAP as well as trigeminal neuralgia. She returns today for follow-up. We are unable to obtain a download. Her machine is greater than 27 years old and is not compatible with equipment. Patient does state that she uses her CPAP nightly. She still finds it beneficial. She reports that she never had daytime sleepiness but she is no longer snoring. She states that her trigeminal neuralgia has been controlled with Tegretol. She denies any new neurological symptoms. Returns today for an evaluation.  HISTORY 07/31/15: Laura Stuart is a 70 year old female with a history of obstructive sleep apnea on CPAP. She returns today for a compliance download. The patient did bring her CPAP machine however she did not bring the cord in order for [place] to download a report. The patient states that she's been using the machine nightly. Her Epworth sleepiness score is 2 was previously 1 and fatigue severity score is 15 was previously 14. Patient also has a history of left-sided trigeminal neuralgia. She is currently on Tegretol and tolerates this medication well. She has not had any flareups over the last year. She returns today for an evaluation  HISTORY 07/25/14 [facility]): Laura Stuart is a 70 y.o. female , who is seen here in a regular revisit from Dr. Birdie Riddle for sleep apnea .  Interval history :   The patient had a relapse , short duration , into trigeminal neuropathy , right face.  This lasted from March to April and was not as painful as in the a past. Now resoled. She continued on her established medications.   The geriatric depression score was endorsed at zero.  Laura Stuart has been using CPAP now for 6.5 years , her study was in early 2009.  The patient's CPAP download documents a  residual AHI of 4.5, average time of daily use of CPAP at 6 hours 56 minutes compliance is 91% for over 4 hours of use.  The patient uses CPAP at a pressure of 7 cm water with 3 cm EPR.  This download encompassed t 90 days, from 04-26-14 07-24-49. The patient uses a nasal pillow  REVIEW OF SYSTEMS: Out of a complete 14 system review of symptoms, the patient complains only of the following symptoms, and all other reviewed systems are negative.  Apnea  ALLERGIES: Allergies  Allergen Reactions  . Penicillins     From allergy testing    HOME MEDICATIONS: Outpatient Medications Prior to Visit  Medication Sig Dispense Refill  . calcium-vitamin D (OSCAL WITH D) 500-200 MG-UNIT per tablet Take 1 tablet by mouth 2 (two) times daily.     . carbamazepine (TEGRETOL-XR) 200 MG 12 hr tablet Take 1 tablet (200 mg total) by mouth 3 (three) times daily. 270 tablet 1  . cholecalciferol (VITAMIN D) 1000 UNITS tablet Take 1,000 Units by mouth daily.    . Halcinonide (HALOG) 0.1 % CREA Apply topically.      Marland Kitchen zolpidem (AMBIEN) 10 MG tablet Take 1 tablet (10 mg total) by mouth at bedtime as needed for sleep. 30 tablet 5   No facility-administered medications prior to visit.     PAST MEDICAL HISTORY: Past Medical History:  Diagnosis Date  . Allergy   . Atypical facial pain   . Neuralgia    Trigeminal  . Sleep apnea  using cpap,OSA  . Trigeminal neuralgia     PAST SURGICAL HISTORY: Past Surgical History:  Procedure Laterality Date  . ROOT CANAL  06/2013  . toes right foot  2010  . TONSILLECTOMY AND ADENOIDECTOMY  1950  . torn miniscus  2003  . VAGINAL DELIVERY     X2    FAMILY HISTORY: Family History  Problem Relation Age of Onset  . Stroke Mother   . Brain cancer Mother   . Lung cancer Mother   . Heart attack Mother   . Emphysema Father   . Alzheimer's disease Sister   . Dementia Other     evident after a fall with a hip fracture  . Colon cancer Neg Hx     SOCIAL  HISTORY: Social History   Social History  . Marital status: Married    Spouse name: [name]  . Number of children: 2  . Years of education: Grad sch.   Occupational History  .      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 Smoking status: Never Smoker  . Smokeless tobacco: Never Used  . Alcohol use 0.0 oz/week     Comment: 1 glass every 2 months; socially  . Drug use: No  . Sexual activity: Not on file   Other Topics Concern  . Not on file   Social History Narrative   Patient is married [name]) and lives at home with her husband.   Patient has two adult children.   Patient is retired.   Patient has a Production designer, theatre/television/film.   Patient is right-handed.   Patient drinks 1 1/2 cups of coffee daily.      PHYSICAL EXAM  Vitals:   07/30/16 0958  BP: 108/66  Pulse: 78  Weight: 170 lb (77.1 kg)   Body mass index is 28.29 kg/m.  Generalized: Well developed, in no acute distress   Neurological examination  Mentation: Alert oriented to time, place, history taking. Follows all commands speech and language fluent Cranial nerve II-XII: Pupils were equal round reactive to light. Extraocular movements were full, visual field were full on confrontational test. Facial sensation and strength were normal. Uvula tongue midline. Head turning and shoulder shrug  were normal and symmetric. Motor: The motor testing reveals 5 over 5 strength of all 4 extremities. Good symmetric motor tone is noted throughout.  Sensory: Sensory testing is intact to soft touch on all 4 extremities. No evidence of extinction is noted.  Coordination: Cerebellar testing reveals good finger-nose-finger and heel-to-shin bilaterally.  Gait and station: Gait is normal. Tandem gait is normal. Romberg is negative. No drift is seen.  Reflexes: Deep tendon reflexes are symmetric and normal bilaterally.   DIAGNOSTIC DATA (LABS, IMAGING,  TESTING) - I reviewed patient records, labs, notes, testing and imaging myself where available.  Lab Results  Component Value Date   WBC 4.8 08/20/2015   HGB 13.1 08/20/2015   HCT 39.6 08/20/2015   MCV 91.7 08/20/2015   PLT 247.0 08/20/2015      Component Value Date/Time   NA 146 (H) 08/20/2015 1051   NA 140 07/25/2014 1308   K 5.2 (H) 08/20/2015 1051   CL 109 08/20/2015 1051   CO2 32 08/20/2015 1051   GLUCOSE 104 (H) 08/20/2015 1051   BUN 21 08/20/2015 1051   BUN 19 07/25/2014 1308   CREATININE 0.75 08/20/2015 1051   CREATININE 0.79 02/05/2013 1117  CALCIUM 9.9 08/20/2015 1051   PROT 6.8 08/20/2015 1051   PROT 6.4 07/25/2014 1308   ALBUMIN 4.0 08/20/2015 1051   ALBUMIN 4.2 07/25/2014 1308   AST 19 08/20/2015 1051   ALT 13 08/20/2015 1051   ALKPHOS 80 08/20/2015 1051   BILITOT 0.4 08/20/2015 1051   GFRNONAA 90 07/25/2014 1308   GFRAA 103 07/25/2014 1308   Lab Results  Component Value Date   CHOL 216 (H) 08/20/2015   HDL 62.10 08/20/2015   LDLCALC 127 (H) 08/20/2015   LDLDIRECT 106.9 05/22/2013   TRIG 137.0 08/20/2015   CHOLHDL 3 08/20/2015      ASSESSMENT AND PLAN 70 y.o. year old female  has a past medical history of Allergy; Atypical facial pain; Neuralgia; Sleep apnea; and Trigeminal neuralgia. here with:  1. Trigeminal neuralgia 2. Sleep apnea  Overall the patient is doing well. I will send a prescription for a new machine. Patient voiced understanding. She also voices some trouble with word finding. Advised patient that we can address memory at the next visit when we are able to obtain a download. This can also be addressed with her primary care if it worsens in the interim. Patient voiced understanding. She will continue on Tegretol. She has a physical scheduled at the end of the month and will ask her primary care to check her carbamazepine level. She will follow-up in 6 months or sooner if needed.     Ward Givens, MSN, NP-C 07/30/2016, 10:35  AM [facility] [address] [place], [place] [address] [phone]

## 2016-07-30 NOTE — Telephone Encounter (Signed)
That is fine  thanks

## 2016-07-30 NOTE — Telephone Encounter (Signed)
-----   Message from Patsy Baltimore sent at 07/30/2016  2:27 PM EDT ----- Vickie Epley, can you have pressure settings added to order for the new machine?   Thanks.  ----- Message ----- From: Lester Amelia, RN Sent: 07/30/2016  11:06 AM To: Patsy Baltimore  New order in!

## 2016-07-30 NOTE — Telephone Encounter (Signed)
I called pt to advise her that her new cpap order was sent to [facility]. I left a message on pt's cell phone asking her to call me back.

## 2016-07-30 NOTE — Telephone Encounter (Signed)
Noted, thanks!

## 2016-07-30 NOTE — Telephone Encounter (Signed)
It appears that pt's current setting is 7 cm H20 with 3 cm EPR.  If you are agreeable to this, I will place the order and send to [facility].

## 2016-07-30 NOTE — Patient Instructions (Signed)
Continue tegretol  Have PCP check carbamazepine level with your blood work later this month Continue using CPAP nightly Order sent for new machine If your symptoms worsen or you develop new symptoms please let us know.

## 2016-07-31 ENCOUNTER — Other Ambulatory Visit: Payer: Self-pay | Admitting: Neurology

## 2016-07-31 NOTE — Telephone Encounter (Signed)
I called the patient. Left a message and advised that I was not in the office today to sign and fax the prescription. Advised her to call me (gave my number) if she was going to run out of medication and I would call it in to the pharmacy otherwise I would print and fax it when I returned to the office.

## 2016-07-31 NOTE — Telephone Encounter (Signed)
Pt request RX for zolpidem (AMBIEN) 10 MG tablet to be sent to [facility]. She said she was not given RX yesterday as discussed. She said by the end of the visit they all forgot about it.

## 2016-08-04 MED ORDER — ZOLPIDEM TARTRATE 10 MG PO TABS: 10.0000 mg | ORAL_TABLET | Freq: Every evening | ORAL | 5 refills | Status: DC | PRN

## 2016-08-04 NOTE — Telephone Encounter (Signed)
Prescription printed and signed. Given to Faith to fax.

## 2016-08-04 NOTE — Addendum Note (Signed)
Addended by: Trudie Buckler on: 08/04/2016 08:42 AM   Modules accepted: Orders

## 2016-08-05 ENCOUNTER — Other Ambulatory Visit: Payer: Self-pay | Admitting: *Deleted

## 2016-08-05 DIAGNOSIS — Z5181 Encounter for therapeutic drug level monitoring: Secondary | ICD-10-CM

## 2016-08-05 MED ORDER — CARBAMAZEPINE ER 200 MG PO TB12: 200.0000 mg | ORAL_TABLET | Freq: Three times a day (TID) | ORAL | 1 refills | Status: DC

## 2016-08-06 ENCOUNTER — Telehealth: Payer: Self-pay

## 2016-08-06 ENCOUNTER — Encounter: Payer: Self-pay | Admitting: Family Medicine

## 2016-08-06 ENCOUNTER — Ambulatory Visit (INDEPENDENT_AMBULATORY_CARE_PROVIDER_SITE_OTHER): Payer: PPO | Admitting: Family Medicine

## 2016-08-06 VITALS — BP 112/72 | HR 68 | Temp 98.2°F | Resp 16 | Ht 65.0 in | Wt 170.0 lb

## 2016-08-06 DIAGNOSIS — Z23 Encounter for immunization: Secondary | ICD-10-CM

## 2016-08-06 DIAGNOSIS — M25511 Pain in right shoulder: Secondary | ICD-10-CM

## 2016-08-06 NOTE — Telephone Encounter (Signed)
-----   Message from Patsy Baltimore sent at 08/06/2016  8:23 AM EDT ----- Cornelious Bryant, we have looked and we do not have. Back when she became a pt with [place] for supplies only having the sleep study wasn't a requirement.  Now of course it is.  Let me know what you can find. Thanks.  Angie  ----- Message ----- From: Laurence Spates, RN Sent: 08/05/2016   3:43 PM To: Patsy Baltimore  No worries. I have access to her email box too. Looks like patient has use [facility] for supplies before? Is there an original sleep study on file at [facility]? I will keep looking from my end.  ----- Message ----- From: Patsy Baltimore Sent: 08/05/2016   2:43 PM To: Kyung Bacca, RN, #  Sorry I didn't realize this email didn't go to you all the other day. Please advise. Thanks.  Angie ----- Message ----- From: Patsy Baltimore Sent: 08/03/2016  12:28 PM To: Patsy Baltimore, Leeroy Bock, #  Vickie Epley, I am not seeing the pt's original diagnostic sleep study in the chart. In order to provide a replacement pap we will need to have a copy of that for her insurance.   Thanks.  Angie ----- Message ----- From: Patsy Baltimore Sent: 07/31/2016   1:06 PM To: Montel Culver, RN  Order sent. Thanks.  ----- Message ----- From: Lester Cowiche, RN Sent: 07/30/2016   3:24 PM To: Patsy Baltimore  New order in, Jinny Blossom will sign it today. ----- Message ----- From: Patsy Baltimore Sent: 07/30/2016   2:27 PM To: Rondel Baton Dinkins, RN  [employer], can you have pressure settings added to order for the new machine?   Thanks.  ----- Message ----- From: Lester Greenwald, RN Sent: 07/30/2016  11:06 AM To: Patsy Baltimore  New order in!

## 2016-08-06 NOTE — Progress Notes (Signed)
   Subjective:    Patient ID: Laura Stuart, female    DOB: 01-28-1946, 70 y.o.   MRN: [id number]  HPI R shoulder pain- sxs started in June while in [place].  [facility]  She initially had a painless 'click' with ROM.  Pt reports there is now discomfort w/ the clicking.  No difficulty working out at [employer] on machines but does have difficulty w/ quick, overhead motions.  No numbness/tingling or weakness.  Pt is interested in PT.  Pt's now hesitant to use arm b/c of the clicking.  'the worst is out and up- like opening a cabinet'.   Review of Systems For ROS see HPI     Objective:   Physical Exam  Constitutional: She is oriented to person, place, and time. She appears well-developed and well-nourished. No distress.  HENT:  Head: Normocephalic and atraumatic.  Cardiovascular: Intact distal pulses.   Musculoskeletal: She exhibits no edema, tenderness or deformity.  Full ROM of R shoulder + discomfort w/ forward flexion and abduction at 90 degrees but able to proceed w/ full ROM Mild + hawkings sign on R Audible 'click' when she lowered arm from overhead position- no pain  Neurological: She is alert and oriented to person, place, and time. She has normal reflexes.  Skin: Skin is warm and dry.  Psychiatric: She has a normal mood and affect. Her behavior is normal. Thought content normal.  Vitals reviewed.         Assessment & Plan:  R shoulder pain- pt reports she is almost more fearful to use the arm b/c of the clicking than true limitation due to pain.  She states she is not sore enough to require regular medication.  Good ROM today in office w/ mild impingement signs.  Refer to PT for complete evaluation and tx.  If no improvement, will refer to Ortho.  Reviewed supportive care and red flags that should prompt return.  Pt expressed understanding and is in agreement w/ plan.

## 2016-08-06 NOTE — Progress Notes (Signed)
Pre visit review using our clinic review tool, if applicable. No additional management support is needed unless otherwise documented below in the visit note. 

## 2016-08-06 NOTE — Telephone Encounter (Signed)
LM for patient to call back. We need a copy of her original sleep study.

## 2016-08-06 NOTE — Patient Instructions (Signed)
Follow up as scheduled We'll call you with your PT appt Ibuprofen as needed for discomfort Alternate heat/ice if needed for discomfort If no improvement after PT- will pursue orthopedic evaluation Call with any questions or concerns Hang in there!!!

## 2016-08-07 NOTE — Telephone Encounter (Signed)
Pt returned RN's call. She said the sleep study was done at [place] when it was on [place] . Pt is going to look thru her papers and see if she has a cc of it. She thinks Dr D had a cc at her 1st visit and they discussed it. She will call back on Monday.

## 2016-08-10 NOTE — Telephone Encounter (Signed)
Pt called to advise she found the sleep study, she will bring it in today.

## 2016-08-10 NOTE — Telephone Encounter (Signed)
Sleep study was faxed to [facility].

## 2016-08-11 NOTE — Telephone Encounter (Signed)
Pt called in stating she has gone to [facility] and they are downloading her information and it will be sent to the office. She has also decided not to get the new machine. She also wants to know if she needs to keep her 6 month appt. Please call and advise [phone]

## 2016-08-11 NOTE — Telephone Encounter (Signed)
It pt decides not to get a new cpap machine, she must bring her old cpap to [facility] a couple days prior to each appt here at [facility] so it may be downloaded since our office cannot download it. Her 6 month appt should be kept.  I called pt to discuss. No answer, left a message asking her to call me back.

## 2016-08-12 NOTE — Telephone Encounter (Signed)
Pt returned call

## 2016-08-12 NOTE — Telephone Encounter (Signed)
I returned pt's call. Pt's husband said she is not available at this time but will call me back.

## 2016-08-12 NOTE — Telephone Encounter (Signed)
I called pt again to discuss. Pt's husband answered, pt is not available, he will ask pt to call me back.

## 2016-08-13 NOTE — Telephone Encounter (Signed)
I spoke to pt. She wants to keep her old cpap and not get a new one. She is agreeable to bringing her cpap machine to [facility] the week before her appt with Dr. Brett Fairy to get a download. She wants to go back to a yearly follow up with Dr. Brett Fairy (as would have been scheduled if she did not want a new cpap). Appt in March cancelled and new appt made for September. Pt verbalized understanding.

## 2016-08-13 NOTE — Telephone Encounter (Signed)
I called pt. No answer, left a message asking her to call me back.

## 2016-08-13 NOTE — Telephone Encounter (Signed)
I called pt's cell phone as she requested. No answer, left a message asking her to call me back.  When pt calls back, please skype me.

## 2016-08-13 NOTE — Telephone Encounter (Signed)
Patient returned Laura Stuart's call, please call [phone].

## 2016-08-19 ENCOUNTER — Encounter: Payer: Self-pay | Admitting: *Deleted

## 2016-08-20 ENCOUNTER — Encounter: Payer: Self-pay | Admitting: Family Medicine

## 2016-08-20 ENCOUNTER — Ambulatory Visit (INDEPENDENT_AMBULATORY_CARE_PROVIDER_SITE_OTHER): Payer: PPO | Admitting: Family Medicine

## 2016-08-20 VITALS — BP 116/76 | HR 76 | Temp 98.0°F | Resp 16 | Ht 65.0 in | Wt 167.5 lb

## 2016-08-20 DIAGNOSIS — H9190 Unspecified hearing loss, unspecified ear: Secondary | ICD-10-CM

## 2016-08-20 DIAGNOSIS — E663 Overweight: Secondary | ICD-10-CM | POA: Diagnosis not present

## 2016-08-20 DIAGNOSIS — Z79899 Other long term (current) drug therapy: Secondary | ICD-10-CM | POA: Diagnosis not present

## 2016-08-20 DIAGNOSIS — M8580 Other specified disorders of bone density and structure, unspecified site: Secondary | ICD-10-CM

## 2016-08-20 DIAGNOSIS — Z0000 Encounter for general adult medical examination without abnormal findings: Secondary | ICD-10-CM | POA: Diagnosis not present

## 2016-08-20 DIAGNOSIS — Z1231 Encounter for screening mammogram for malignant neoplasm of breast: Secondary | ICD-10-CM | POA: Diagnosis not present

## 2016-08-20 LAB — BASIC METABOLIC PANEL
BUN: 18 mg/dL (ref 6–23)
CHLORIDE: 106 meq/L (ref 96–112)
CO2: 32 meq/L (ref 19–32)
CREATININE: 0.79 mg/dL (ref 0.40–1.20)
Calcium: 9.4 mg/dL (ref 8.4–10.5)
GFR: 76.4 mL/min (ref >60.00)
Glucose, Bld: 87 mg/dL (ref 70–99)
Potassium: 4.2 mEq/L (ref 3.5–5.1)
Sodium: 142 mEq/L (ref 135–145)

## 2016-08-20 LAB — CBC WITH DIFFERENTIAL/PLATELET
BASOS PCT: 0.7 % (ref 0.0–3.0)
Basophils Absolute: 0 10*3/uL (ref 0.0–0.1)
EOS ABS: 0 10*3/uL (ref 0.0–0.7)
EOS PCT: 1.1 % (ref 0.0–5.0)
HCT: 35 % — ABNORMAL LOW (ref 36.0–46.0)
HEMOGLOBIN: 12 g/dL (ref 12.0–15.0)
LYMPHS ABS: 1.4 10*3/uL (ref 0.7–4.0)
Lymphocytes Relative: 35.2 % (ref 12.0–46.0)
MCHC: 34.4 g/dL (ref 30.0–36.0)
MCV: 90.5 fl (ref 78.0–100.0)
MONO ABS: 0.4 10*3/uL (ref 0.1–1.0)
Monocytes Relative: 8.6 % (ref 3.0–12.0)
NEUTROS PCT: 54.4 % (ref 43.0–77.0)
Neutro Abs: 2.2 10*3/uL (ref 1.4–7.7)
PLATELETS: 267 10*3/uL (ref 150.0–400.0)
RBC: 3.87 Mil/uL (ref 3.87–5.11)
RDW: 14.4 % (ref 11.5–15.5)
WBC: 4.1 10*3/uL (ref 4.0–10.5)

## 2016-08-20 LAB — LIPID PANEL
CHOL/HDL RATIO: 3
Cholesterol: 203 mg/dL — ABNORMAL HIGH (ref 0–200)
HDL: 64.6 mg/dL (ref >39.00)
LDL Cholesterol: 119 mg/dL — ABNORMAL HIGH (ref 0–99)
NonHDL: 138.73
TRIGLYCERIDES: 100 mg/dL (ref 0.0–149.0)
VLDL: 20 mg/dL (ref 0.0–40.0)

## 2016-08-20 LAB — VITAMIN D 25 HYDROXY (VIT D DEFICIENCY, FRACTURES): VITD: 28.07 ng/mL — AB (ref 30.00–100.00)

## 2016-08-20 LAB — HEPATIC FUNCTION PANEL
ALK PHOS: 69 U/L (ref 39–117)
ALT: 12 U/L (ref 0–35)
AST: 14 U/L (ref 0–37)
Albumin: 3.9 g/dL (ref 3.5–5.2)
BILIRUBIN TOTAL: 0.4 mg/dL (ref 0.2–1.2)
Bilirubin, Direct: 0.1 mg/dL (ref 0.0–0.3)
Total Protein: 6.4 g/dL (ref 6.0–8.3)

## 2016-08-20 LAB — TSH: TSH: 2.83 u[IU]/mL (ref 0.35–4.50)

## 2016-08-20 NOTE — Progress Notes (Signed)
   Subjective:    Patient ID: Laura Stuart, female    DOB: August 17, 1946, 70 y.o.   MRN: [id number]  HPI Here today for CPE.  Risk Factors: Osteopenia- chronic problem, on Ca and Vit D daily Overweight- ongoing issue, BMI now 27.87.  Exercising regularly.  Will need labs to risk stratify Physical Activity: exercising regularly Fall Risk: moderate- pt has tripped multiple times Depression: denies current sxs Hearing: decreased to conversational tones- would like audiology referral ADL's: independent Cognitive: normal linear thought process, memory and attention intact Home Safety: safe at home, lives w/ husband Height, Weight, BMI, Visual Acuity: see vitals, vision corrected to 20/20 w/ glasses Counseling: UTD on colonoscopy (due 2020), DEXA (due 2018), mammo (due Oct), and all vaccines Care team reviewed and updated Labs Ordered: See A&P Care Plan: See A&P    Review of Systems Patient reports no vision changes, adenopathy,fever, weight change,  persistant/recurrent hoarseness , swallowing issues, chest pain, palpitations, edema, persistant/recurrent cough, hemoptysis, dyspnea (rest/exertional/paroxysmal nocturnal), gastrointestinal bleeding (melena, rectal bleeding), abdominal pain, significant heartburn, bowel changes, GU symptoms (dysuria, hematuria, incontinence), Gyn symptoms (abnormal  bleeding, pain),  syncope, focal weakness, memory loss, numbness & tingling, skin/hair/nail changes, abnormal bruising or bleeding, anxiety, or depression.     Objective:   Physical Exam General Appearance:    Alert, cooperative, no distress, appears stated age  Head:    Normocephalic, without obvious abnormality, atraumatic  Eyes:    PERRL, conjunctiva/corneas clear, EOM's intact, fundi    benign, both eyes  Ears:    Normal TM's and external ear canals, both ears  Nose:   Nares normal, septum midline, mucosa normal, no drainage    or sinus tenderness  Throat:   Lips, mucosa, and tongue  normal; teeth and gums normal  Neck:   Supple, symmetrical, trachea midline, no adenopathy;    Thyroid: no enlargement/tenderness/nodules  Back:     Symmetric, no curvature, ROM normal, no CVA tenderness  Lungs:     Clear to auscultation bilaterally, respirations unlabored  Chest Wall:    No tenderness or deformity   Heart:    Regular rate and rhythm, S1 and S2 normal, no murmur, rub   or gallop  Breast Exam:    Deferred to mammo  Abdomen:     Soft, non-tender, bowel sounds active all four quadrants,    no masses, no organomegaly  Genitalia:    Deferred  Rectal:    Extremities:   Extremities normal, atraumatic, no cyanosis or edema  Pulses:   2+ and symmetric all extremities  Skin:   Skin color, texture, turgor normal, no rashes or lesions  Lymph nodes:   Cervical, supraclavicular, and axillary nodes normal  Neurologic:   CNII-XII intact, normal strength, sensation and reflexes    throughout          Assessment & Plan:

## 2016-08-20 NOTE — Assessment & Plan Note (Signed)
Ongoing issue for pt.  Applauded her efforts at regular exercise.  Check labs to risk stratify.  Will follow. 

## 2016-08-20 NOTE — Patient Instructions (Signed)
Follow up in 1 year or as needed We'll notify you of your lab results and make any changes if needed Continue to work on healthy diet and regular exercise- you look great! We'll call you with your audiology and mammo appts You are up to date on colonoscopy until 2025- yay!!! You are up to date on bone density until 2018 Call with any questions or concerns Have a great trip!!!

## 2016-08-20 NOTE — Progress Notes (Signed)
Pre visit review using our clinic review tool, if applicable. No additional management support is needed unless otherwise documented below in the visit note. 

## 2016-08-20 NOTE — Assessment & Plan Note (Signed)
Pt's PE WNL w/ exception of being overweight.  UTD on mammo (due in Oct), colonoscopy, bone density, immunizations.  Written screening schedule updated and given to pt.  Check labs.  Anticipatory guidance provided.

## 2016-08-20 NOTE — Assessment & Plan Note (Signed)
Chronic problem, UTD on DEXA.  Taking Ca and Vit D daily.  Check Vit D and replete prn.

## 2016-08-21 ENCOUNTER — Encounter: Payer: Self-pay | Admitting: General Practice

## 2016-08-21 LAB — CARBAMAZEPINE LEVEL, TOTAL: Carbamazepine Lvl: 6.7 mg/L (ref 4.0–12.0)

## 2016-08-27 DIAGNOSIS — M7581 Other shoulder lesions, right shoulder: Secondary | ICD-10-CM | POA: Diagnosis not present

## 2016-09-08 DIAGNOSIS — Z1231 Encounter for screening mammogram for malignant neoplasm of breast: Secondary | ICD-10-CM | POA: Diagnosis not present

## 2016-09-08 LAB — HM MAMMOGRAPHY

## 2016-09-10 ENCOUNTER — Encounter: Payer: Self-pay | Admitting: General Practice

## 2016-09-22 DIAGNOSIS — M7581 Other shoulder lesions, right shoulder: Secondary | ICD-10-CM | POA: Diagnosis not present

## 2016-10-01 DIAGNOSIS — M7581 Other shoulder lesions, right shoulder: Secondary | ICD-10-CM | POA: Diagnosis not present

## 2016-10-06 DIAGNOSIS — H93293 Other abnormal auditory perceptions, bilateral: Secondary | ICD-10-CM | POA: Diagnosis not present

## 2016-10-06 DIAGNOSIS — H903 Sensorineural hearing loss, bilateral: Secondary | ICD-10-CM | POA: Diagnosis not present

## 2016-10-07 DIAGNOSIS — M7581 Other shoulder lesions, right shoulder: Secondary | ICD-10-CM | POA: Diagnosis not present

## 2016-10-20 DIAGNOSIS — M7581 Other shoulder lesions, right shoulder: Secondary | ICD-10-CM | POA: Diagnosis not present

## 2016-10-26 ENCOUNTER — Encounter: Payer: Self-pay | Admitting: Family Medicine

## 2016-10-26 ENCOUNTER — Ambulatory Visit (INDEPENDENT_AMBULATORY_CARE_PROVIDER_SITE_OTHER): Payer: PPO | Admitting: Family Medicine

## 2016-10-26 VITALS — BP 114/82 | HR 83 | Temp 98.7°F | Resp 17 | Ht 65.0 in | Wt 167.0 lb

## 2016-10-26 DIAGNOSIS — J302 Other seasonal allergic rhinitis: Secondary | ICD-10-CM

## 2016-10-26 DIAGNOSIS — K5901 Slow transit constipation: Secondary | ICD-10-CM

## 2016-10-26 NOTE — Progress Notes (Signed)
   Subjective:    Patient ID: Laura Stuart, female    DOB: 22-Aug-1946, 70 y.o.   MRN: [id number]  HPI Cough- pt reports she will feel a tickle in her throat and then the cough follows.  Worse at night.  Pt has had 2-3 weeks of cough in October and 2-3 weeks this month of productive cough.  No fevers.  No sinus pain/pressure.  No ear pain.  No sore throat.  + PND, clearing throat frequently.  abd pain- pt was awake most of Saturday night w/ abdominal discomfort.  Has not moved bowels since Friday.  Pt is having gas.  Since scheduling appt, she has had no pain.   Review of Systems For ROS see HPI     Objective:   Physical Exam  Constitutional: She is oriented to person, place, and time. She appears well-developed and well-nourished. No distress.  HENT:  Head: Normocephalic and atraumatic.  Right Ear: Tympanic membrane normal.  Left Ear: Tympanic membrane normal.  Nose: Mucosal edema and rhinorrhea present. Right sinus exhibits no maxillary sinus tenderness and no frontal sinus tenderness. Left sinus exhibits no maxillary sinus tenderness and no frontal sinus tenderness.  Mouth/Throat: Mucous membranes are normal. Posterior oropharyngeal erythema (w/ PND) present.  Eyes: Conjunctivae and EOM are normal. Pupils are equal, round, and reactive to light.  Neck: Normal range of motion. Neck supple.  Cardiovascular: Normal rate, regular rhythm and normal heart sounds.   Pulmonary/Chest: Effort normal and breath sounds normal. No respiratory distress. She has no wheezes. She has no rales.  Abdominal: Soft. Bowel sounds are normal. She exhibits no distension. There is no tenderness. There is no rebound and no guarding.  Lymphadenopathy:    She has no cervical adenopathy.  Neurological: She is alert and oriented to person, place, and time.  Skin: Skin is warm and dry.  Psychiatric: She has a normal mood and affect. Her behavior is normal. Thought content normal.  Vitals  reviewed.         Assessment & Plan:  Cough- new.  This is likely due to pt's untreated seasonal allergies.  Start daily Zyrtec.  Drink plenty of fluids.  No evidence of bacterial infection on PE.  No need for abx.  Reviewed supportive care and red flags that should prompt return.  Pt expressed understanding and is in agreement w/ plan.   Constipation- new.  Likely due to dietary changes over Thanksgiving.  Start Colace and/or MoM/Prune Juice regimen.  Reviewed supportive care and red flags that should prompt return.  Pt expressed understanding and is in agreement w/ plan.

## 2016-10-26 NOTE — Patient Instructions (Signed)
Follow up as needed/scheduled Start daily Zyrtec to improve post-nasal drip and allergy symptoms Start OTC colace or you can start the Prune Juice/Milk of Magnesia cocktail Drink plenty of fluids Call with any questions or concerns Hang in there! Happy Holidays!!!

## 2016-10-26 NOTE — Progress Notes (Signed)
Pre visit review using our clinic review tool, if applicable. No additional management support is needed unless otherwise documented below in the visit note. 

## 2016-11-05 DIAGNOSIS — M7581 Other shoulder lesions, right shoulder: Secondary | ICD-10-CM | POA: Diagnosis not present

## 2016-12-28 ENCOUNTER — Ambulatory Visit (INDEPENDENT_AMBULATORY_CARE_PROVIDER_SITE_OTHER): Payer: PPO | Admitting: Neurology

## 2016-12-28 ENCOUNTER — Encounter: Payer: Self-pay | Admitting: Neurology

## 2016-12-28 ENCOUNTER — Telehealth: Payer: Self-pay

## 2016-12-28 VITALS — BP 121/69 | HR 76 | Resp 20 | Ht 65.0 in | Wt 172.0 lb

## 2016-12-28 DIAGNOSIS — G4733 Obstructive sleep apnea (adult) (pediatric): Secondary | ICD-10-CM

## 2016-12-28 DIAGNOSIS — G500 Trigeminal neuralgia: Secondary | ICD-10-CM

## 2016-12-28 DIAGNOSIS — Z9989 Dependence on other enabling machines and devices: Secondary | ICD-10-CM

## 2016-12-28 DIAGNOSIS — Z5181 Encounter for therapeutic drug level monitoring: Secondary | ICD-10-CM

## 2016-12-28 MED ORDER — TEGRETOL 200 MG PO TABS: 200.0000 mg | ORAL_TABLET | Freq: Three times a day (TID) | ORAL | 5 refills | Status: DC

## 2016-12-28 NOTE — Telephone Encounter (Signed)
PA for tegretol completed and sent to [facility]. I spoke to Dr. Brett Fairy, who told me that the pt has tried gabapentin for her trigeminal neuralgia and failed it. This was included on the pa. Should have a determination in 3-5 business days.

## 2016-12-28 NOTE — Patient Instructions (Signed)
Trigeminal Neuralgia Trigeminal neuralgia is a nerve disorder that causes attacks of severe facial pain. The attacks last from a few seconds to several minutes. They can happen for days, weeks, or months and then go away for months or years. Trigeminal neuralgia is also called tic douloureux. What are the causes? This condition is caused by damage to a nerve in the face that is called the trigeminal nerve. An attack can be triggered by:  Talking.  Chewing.  Putting on makeup.  Washing your face.  Shaving your face.  Brushing your teeth.  Touching your face. What increases the risk? This condition is more likely to develop in:  Women.  People who are 65 years of age or older. What are the signs or symptoms? The main symptom of this condition is pain in the jaw, lips, eyes, nose, scalp, forehead, and face. The pain may be intense, stabbing, electric, or shock-like. How is this diagnosed? This condition is diagnosed with a physical exam. A CT scan or MRI may be done to rule out other conditions that can cause facial pain. How is this treated? This condition may be treated with:  Avoiding the things that trigger your attacks.  Pain medicine.  Surgery. This may be done in severe cases if other medical treatment does not provide relief. Follow these instructions at home:  Take over-the-counter and prescription medicines only as told by your health care provider.  If you wish to get pregnant, talk with your health care provider before you start trying to get pregnant.  Avoid the things that trigger your attacks. It may help to:  Chew on the unaffected side of your mouth.  Avoid touching your face.  Avoid blasts of hot or cold air. Contact a health care provider if:  Your pain medicine is not helping.  You develop new, unexplained symptoms, such as:  Double vision.  Facial weakness.  Changes in hearing or balance.  You become pregnant. Get help right away  if:  Your pain is unbearable, and your pain medicine does not help. This information is not intended to replace advice given to you by your health care provider. Make sure you discuss any questions you have with your health care provider. Document Released: 11/13/2000 Document Revised: 07/19/2016 Document Reviewed: 03/11/2015 Elsevier Interactive Patient Education  2017 [place]. Carbamazepine extended-release tablets What is this medicine? CARBAMAZEPINE (kar ba MAZ e peen) is used to control seizures caused by certain types of epilepsy. This medicine is also used to treat nerve related pain. It is not for common aches and pains. This medicine may be used for other purposes; ask your health care provider or pharmacist if you have questions. COMMON BRAND NAME(S): Tegretol -XR What should I tell my health care provider before I take this medicine? They need to know if you have any of these conditions: -Asian ancestry -bone marrow disease -glaucoma -heart disease or irregular heartbeat -kidney disease -liver disease -low blood counts, like low white cell, platelet, or red cell counts -porphyria -psychotic disorders -suicidal thoughts, plans, or attempt; a previous suicide attempt by you or a family member -an unusual or allergic reaction to carbamazepine, tricyclic antidepressants, phenytoin, phenobarbital or other medicines, foods, dyes, or preservatives -pregnant or trying to get pregnant -breast-feeding How should I use this medicine? Take this medicine by mouth with a glass of water. Follow the directions on the prescription label. Swallow whole, do not cut, crush or chew. The tablets should be inspected for chips or cracks. Do  not take any tablets that are damaged. Take this medicine with food. Take your doses at regular intervals. Do not take your medicine more often than directed. Do not stop taking this medicine except on the advice of your doctor or health care professional. A  special MedGuide will be given to you by the pharmacist with each prescription and refill. Be sure to read this information carefully each time. Talk to your pediatrician regarding the use of this medicine in children. Special care may be needed. Overdosage: If you think you have taken too much of this medicine contact a poison control center or emergency room at once. NOTE: This medicine is only for you. Do not share this medicine with others. What if I miss a dose? If you miss a dose, take it as soon as you can. If it is almost time for your next dose, take only that dose. Do not take double or extra doses. What may interact with this medicine? Do not take this medicine with any of the following medications: -certain medicines used to treat HIV infection or AIDS that are given in combination with cobicistat - delavirdine - MAOIs like Carbex, Eldepryl, Marplan, Nardil, and Parnate - nefazodone - oxcarbazepine This medicine may also interact with the following medications: - acetaminophen - acetazolamide - barbiturate medicines for inducing sleep or treating seizures, like phenobarbital - certain antibiotics like clarithromycin, erythromycin or troleandomycin - cimetidine - cyclosporine - danazol - dicumarol - doxycycline - female hormones, including estrogens and birth control pills - grapefruit juice - isoniazid, INH - levothyroxine and other thyroid hormones - lithium and other medicines to treat mood problems or psychotic disturbances - loratadine - medicines for angina or high blood pressure - medicines for cancer - medicines for depression or anxiety - medicines for sleep - medicines to treat fungal infections, like fluconazole, itraconazole or ketoconazole - medicines used to treat HIV infection or AIDS - methadone - niacinamide - praziquantel - propoxyphene - rifampin or rifabutin - seizure or epilepsy medicine - steroid medicines such  as prednisone or cortisone - theophylline - tramadol - warfarin This list may not describe all possible interactions. Give your health care provider a list of all the medicines, herbs, non-prescription drugs, or dietary supplements you use. Also tell them if you smoke, drink alcohol, or use illegal drugs. Some items may interact with your medicine. What should I watch for while using this medicine? Visit your doctor or health care professional for a regular check on your progress. Do not change brands or dosage forms of this medicine without discussing the change with your doctor or health care professional. If you are taking this medicine for epilepsy (seizures) do not stop taking it suddenly. This increases the risk of seizures. Wear a Probation officer or necklace. Carry an identification card with information about your condition, medications, and doctor or health care professional. Dennis Bast may get drowsy, dizzy, or have blurred vision. Do not drive, use machinery, or do anything that needs mental alertness until you know how this medicine affects you. To reduce dizzy or fainting spells, do not sit or stand up quickly, especially if you are an older patient. Alcohol can increase drowsiness and dizziness. Avoid alcoholic drinks. Birth control pills may not work properly while you are taking this medicine. Talk to your doctor about using an extra method of birth control. This medicine can make you more sensitive to the sun. Keep out of the sun. If you cannot avoid being in the sun,  wear protective clothing and use sunscreen. Do not use sun lamps or tanning beds/booths. The coating on the tablets is not absorbed in the body and you may notice it in your stool. This is no cause for concern. The use of this medicine may increase the chance of suicidal thoughts or actions. Pay special attention to how you are responding while on this medicine. Any worsening of mood, or thoughts of suicide or dying should  be reported to your health care professional right away. Women who become pregnant while using this medicine may enroll in the [place] Pregnancy Registry by calling [phone]. This registry collects information about the safety of antiepileptic drug use during pregnancy. What side effects may I notice from receiving this medicine? Side effects that you should report to your doctor or health care professional as soon as possible: -allergic reactions like skin rash, itching or hives, swelling of the face, lips, or tongue -breathing problems -changes in vision -confusion -dark urine -fast or irregular heartbeat -fever or chills, sore throat -mouth ulcers -pain or difficulty passing urine -redness, blistering, peeling or loosening of the skin, including inside the mouth -ringing in the ears -seizures -stomach pain -swollen joints or muscle/joint aches and pains -unusual bleeding or bruising -unusually weak or tired -vomiting -worsening of mood, thoughts or actions of suicide or dying -yellowing of the eyes or skin Side effects that usually do not require medical attention (report to your doctor or health care professional if they continue or are bothersome): -clumsiness or unsteadiness -diarrhea or constipation -headache -increased sweating -nausea This list may not describe all possible side effects. Call your doctor for medical advice about side effects. You may report side effects to FDA at 1-800-FDA-1088. Where should I keep my medicine? Keep out of reach of children. Store at room temperature between 15 and 30 degrees C (59 and 86 degrees F). Keep container tightly closed. Protect from moisture. Throw away any unused medicine after the expiration date. NOTE: This sheet is a summary. It may not cover all possible information. If you have questions about this medicine, talk to your doctor, pharmacist, or health care provider.  2017 Elsevier/Gold  Standard (2014-11-08 15:36:25)

## 2016-12-28 NOTE — Progress Notes (Signed)
PATIENT: Laura Stuart DOB: 08-Jun-1946  REASON FOR VISIT: follow up- OSA on CPAP HISTORY FROM: patient  HISTORY OF PRESENT ILLNESS: HISTORY 07/31/15: Laura Stuart is a 71 year old female with a history of obstructive sleep apnea on CPAP. She returns today for a compliance download. The patient did bring her CPAP machine however she did not bring the cord in order for [place] to download a report. The patient states that she's been using the machine nightly. Her Epworth sleepiness score is 2 was previously 1 and fatigue severity score is 15 was previously 14. Patient also has a history of left-sided trigeminal neuralgia. She is currently on Tegretol and tolerates this medication well. She has not had any flareups over the last year. She returns today for an evaluation  HISTORY 07/25/14 Laura Stuart): Laura Stuart is a 71 y.o. female , who is seen here in a regular revisit from Laura Stuart for sleep apnea . Interval history : The patient had a relapse , short duration , into trigeminal neuropathy , right face.  This lasted from March to April and was not as painful as in the a past. Now resoled. She continued on her established medications. The geriatric depression score was endorsed at zero. Laura Stuart has been using CPAP now for 6.5 years , her study was in early 2009.  The patient's CPAP download documents a residual AHI of 4.5, average time of daily use of CPAP at 6 hours 56 minutes compliance is 91% for over 4 hours of use.  The patient uses CPAP at a pressure of 7 cm water with 3 cm EPR.  This download encompassed t 90 days, from 04-26-14 07-24-49. The patient uses a nasal pillow.  Interval history from 12/28/2016, Mrs. rest is here to follow-up on 3 different neurologic concerns #1 a flare up of right sided trigeminal neuralgia involving the middle branch of the trigeminal nerve. This has been controlled for the last 15 years on Tegretol she has changed between different generic  manufactures. She does wonder if there was a less effective medication given to her that allowed the breakthrough. We will order today a Tegretol level, CBC to rule out leukopenia, and a metabolic panel to rule out hyponatremia. I will refill the prescription.  The patient also had calmly mentioned that she had some word finding difficulties when she last spoke to my nurse practitioner Laura Stuart in July 2017. For this reason a Laura Stuart cognitive assessment was obtained, the patient scored 29 out of 30 points she missed 1 point in the 5 word recall. This is an excellent result. Trail making and attention were intact.    The patient follows for sleep apnea but vehemently longer able to download her machine. She has to go through [facility] and that this today. The machine is currently set at 7 cm water pressure with 3 cm EPR, average daily usage is 5 hours, 60% of the time she uses his over 4 hours, residual AHI is 4.3 there is no breakdown into what kind of apneas may be residual, most events are not apneas but shallow breathing. This download encompassed 66 days, she had one gaps. Of one week in early January. Otherwise she seems to be a compliant user. She does have high air leaks. I would suggest to increase the pressure from 7-8 cm water to address the residual apneas. Just wrote order to Laura Stuart.  The Epworth sleepiness score is 0, fatigue severity score is 7, geriatric depression score is 0 out  of 15.      REVIEW OF SYSTEMS: Out of a complete 14 system review of symptoms, the patient complains only of the following symptoms, and all other reviewed systems are negative.  Apnea  ALLERGIES: Allergies  Allergen Reactions  . Penicillins     From allergy testing    HOME MEDICATIONS: Outpatient Medications Prior to Visit  Medication Sig Dispense Refill  . Calcium Carb-Cholecalciferol (OYSTERCAL-D PO) Take 3 tablets by mouth daily. 3000mg  calcium-750mg  D    . carbamazepine (TEGRETOL-XR) 200  MG 12 hr tablet Take 1 tablet (200 mg total) by mouth 3 (three) times daily. 270 tablet 1  . cholecalciferol (VITAMIN D) 1000 UNITS tablet Take 1,000 Units by mouth daily.    . Halcinonide (HALOG) 0.1 % CREA Apply topically.      Marland Kitchen zolpidem (AMBIEN) 10 MG tablet Take 1 tablet (10 mg total) by mouth at bedtime as needed for sleep. 30 tablet 5   No facility-administered medications prior to visit.     PAST MEDICAL HISTORY: Past Medical History:  Diagnosis Date  . Allergy   . Atypical facial pain   . Neuralgia    Trigeminal  . Sleep apnea    using cpap,OSA  . Trigeminal neuralgia     PAST SURGICAL HISTORY: Past Surgical History:  Procedure Laterality Date  . ROOT CANAL  06/2013  . toes right foot  2010  . TONSILLECTOMY AND ADENOIDECTOMY  1950  . torn miniscus  2003  . VAGINAL DELIVERY     X2    FAMILY HISTORY: Family History  Problem Relation Age of Onset  . Stroke Mother   . Brain cancer Mother   . Lung cancer Mother   . Heart attack Mother   . Emphysema Father   . Alzheimer's disease Sister   . Dementia Other     evident after a fall with a hip fracture  . Colon cancer Neg Hx     SOCIAL HISTORY: Social History   Social History  . Marital status: Married    Spouse name: Laura Stuart  . Number of children: 2  . Years of education: Grad sch.   Occupational History  .      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 Smoking status: Never Smoker  . Smokeless tobacco: Never Used  . Alcohol use 0.0 oz/week     Comment: 1 glass every 2 months; socially  . Drug use: No  . Sexual activity: Not on file   Other Topics Concern  . Not on file   Social History Narrative   Patient is married Laura Stuart) and lives at home with her husband.   Patient has two adult children.   Patient is retired.   Patient has a Production designer, theatre/television/film.   Patient is right-handed.   Patient drinks 1 1/2 cups of coffee  daily.      PHYSICAL EXAM  Vitals:   12/28/16 0835  BP: 121/69  Pulse: 76  Resp: 20  Weight: 172 lb (78 kg)  Height: 5\' 5"  (1.651 m)   Body mass index is 28.62 kg/m.  Generalized: Well developed, in no acute distress  Patient neck circumference is  13.5  , Mallampati 2-3 , reddened and swollen uvula , but not elongated.   Neurological examination  Mentation: Alert oriented to time, place, history taking. Follows all commands speech and language fluent Cranial nerve ; no change in taste  or smell.   Pupils were equal round reactive to light. Extraocular movements were full, visual field were full on confrontational test.  No nystagmus, no ptosis.  Facial sensation and strength were normal. Uvula tongue midline. Head turning and shoulder shrug  were normal and symmetric. Motor: 5/ 5 strength of all 4 extremities. Good symmetric motor tone is noted throughout.  Sensory:  soft touch intact on all 4 extremities. Coordination: intact  finger-nose-finger and heel-to-shin bilaterally.  Gait and station: Gait is normal. Tandem gait is normal. No drift is seen.  Reflexes: Deep tendon reflexes are symmetric and normal bilaterally.   The right facial pain is atypical, and involves the middle-branch of the trigeminal nerve. Between pain attacks there is no numbness or impaired based light sensation.  DIAGNOSTIC DATA (LABS, IMAGING, TESTING) - I reviewed patient records, labs, notes, testing and imaging myself where available.  Montreal Cognitive Assessment  12/28/2016  Visuospatial/ Executive (0/5) 5  Naming (0/3) 3  Attention: Read list of digits (0/2) 2  Attention: Read list of letters (0/1) 1  Attention: Serial 7 subtraction starting at 100 (0/3) 3  Language: Repeat phrase (0/2) 2  Language : Fluency (0/1) 1  Abstraction (0/2) 2  Delayed Recall (0/5) 4  Orientation (0/6) 6  Total 29  Adjusted Score (based on education) 29     Lab Results  Component Value Date   WBC 4.1  08/20/2016   HGB 12.0 08/20/2016   HCT 35.0 (L) 08/20/2016   MCV 90.5 08/20/2016   PLT 267.0 08/20/2016      Component Value Date/Time   NA 142 08/20/2016 1042   NA 140 07/25/2014 1308   K 4.2 08/20/2016 1042   CL 106 08/20/2016 1042   CO2 32 08/20/2016 1042   GLUCOSE 87 08/20/2016 1042   BUN 18 08/20/2016 1042   BUN 19 07/25/2014 1308   CREATININE 0.79 08/20/2016 1042   CREATININE 0.79 02/05/2013 1117   CALCIUM 9.4 08/20/2016 1042   PROT 6.4 08/20/2016 1042   PROT 6.4 07/25/2014 1308   ALBUMIN 3.9 08/20/2016 1042   ALBUMIN 4.2 07/25/2014 1308   AST 14 08/20/2016 1042   ALT 12 08/20/2016 1042   ALKPHOS 69 08/20/2016 1042   BILITOT 0.4 08/20/2016 1042   GFRNONAA 90 07/25/2014 1308   GFRAA 103 07/25/2014 1308   Lab Results  Component Value Date   CHOL 203 (H) 08/20/2016   HDL 64.60 08/20/2016   LDLCALC 119 (H) 08/20/2016   LDLDIRECT 106.9 05/22/2013   TRIG 100.0 08/20/2016   CHOLHDL 3 08/20/2016      ASSESSMENT AND PLAN 71 y.o. year old female  has a past medical history of Allergy; Atypical facial pain; Neuralgia; Sleep apnea; and Trigeminal neuralgia. here with:  1. Trigeminal neuralgia, 15 year history - flei up for the last 2 weeks, very painfull.  2. Sleep apnea, OSA on CPAP.  3. Memory test,    Patient did not received a new CPAP machine after her last visit was new machine. She gets download through [facility],  She also voices some trouble with word finding.   She will continue on Tegretol.  She has a physical scheduled at the end of the [place] will ask her primary care to check her carbamazepine level, as we do today. CBC and Diff, CMET for sodium.  She will follow-up in 6 months or sooner if needed.   Laurielle Selmon, MD   12/28/2016, 8:57 AM [facility] [address], [place], [place] [address] [phone]  [phone]   

## 2016-12-28 NOTE — Addendum Note (Signed)
Addended by: Larey Seat on: 12/28/2016 09:17 AM   Modules accepted: Orders

## 2016-12-29 LAB — CBC
HEMATOCRIT: 37.1 % (ref 34.0–46.6)
Hemoglobin: 11.7 g/dL (ref 11.1–15.9)
MCH: 29.1 pg (ref 26.6–33.0)
MCHC: 31.5 g/dL (ref 31.5–35.7)
MCV: 92 fL (ref 79–97)
Platelets: 284 10*3/uL (ref 150–379)
RBC: 4.02 x10E6/uL (ref 3.77–5.28)
RDW: 14.6 % (ref 12.3–15.4)
WBC: 4.1 10*3/uL (ref 3.4–10.8)

## 2016-12-29 LAB — CARBAMAZEPINE LEVEL, TOTAL: Carbamazepine (Tegretol), S: 6.4 ug/mL (ref 4.0–12.0)

## 2016-12-29 LAB — COMPREHENSIVE METABOLIC PANEL
ALBUMIN: 4 g/dL (ref 3.5–4.8)
ALT: 15 IU/L (ref 0–32)
AST: 19 IU/L (ref 0–40)
Albumin/Globulin Ratio: 1.8 (ref 1.2–2.2)
Alkaline Phosphatase: 77 IU/L (ref 39–117)
BUN / CREAT RATIO: 22 (ref 12–28)
BUN: 19 mg/dL (ref 8–27)
Bilirubin Total: <0.2 mg/dL (ref 0.0–1.2)
CO2: 26 mmol/L (ref 18–29)
CREATININE: 0.85 mg/dL (ref 0.57–1.00)
Calcium: 9.4 mg/dL (ref 8.7–10.3)
Chloride: 105 mmol/L (ref 96–106)
GFR calc non Af Amer: 70 mL/min/{1.73_m2} (ref >59)
GFR, EST AFRICAN AMERICAN: 80 mL/min/{1.73_m2} (ref >59)
GLUCOSE: 92 mg/dL (ref 65–99)
Globulin, Total: 2.2 g/dL (ref 1.5–4.5)
Potassium: 4.3 mmol/L (ref 3.5–5.2)
Sodium: 143 mmol/L (ref 134–144)
TOTAL PROTEIN: 6.2 g/dL (ref 6.0–8.5)

## 2016-12-29 NOTE — Telephone Encounter (Signed)
PA for tegretol was approved by [employer] from 12/29/16 and to 11/29/2017. [facility] informed.

## 2016-12-30 ENCOUNTER — Telehealth: Payer: Self-pay

## 2016-12-30 NOTE — Telephone Encounter (Signed)
I called pt to discuss labs. No answer, I left messages at both home and cell numbers asking pt to call me back.

## 2016-12-30 NOTE — Telephone Encounter (Signed)
-----   Message from Larey Seat, MD sent at 12/30/2016  4:53 PM EST ----- Normal tegretol level, no toxicity- patient uses this medication for trigeminal neuralgia. No leukopenia and no hyponatremia present.

## 2016-12-31 ENCOUNTER — Other Ambulatory Visit: Payer: Self-pay

## 2016-12-31 NOTE — Telephone Encounter (Signed)
I called pt again to discuss her labs. No answer, left a message asking her to call me back.

## 2016-12-31 NOTE — Telephone Encounter (Signed)
Pt returned my call. I advised her that tegretol level was normal. Pt verbalized understanding of results. Pt had no questions at this time but was encouraged to call back if questions arise.

## 2017-01-13 DIAGNOSIS — G4733 Obstructive sleep apnea (adult) (pediatric): Secondary | ICD-10-CM | POA: Diagnosis not present

## 2017-02-01 ENCOUNTER — Ambulatory Visit: Payer: PPO | Admitting: Neurology

## 2017-02-04 ENCOUNTER — Encounter: Payer: Self-pay | Admitting: Neurology

## 2017-02-04 ENCOUNTER — Ambulatory Visit (INDEPENDENT_AMBULATORY_CARE_PROVIDER_SITE_OTHER): Payer: PPO | Admitting: Neurology

## 2017-02-04 VITALS — BP 129/65 | HR 73 | Resp 20 | Ht 65.0 in | Wt 171.0 lb

## 2017-02-04 DIAGNOSIS — B0222 Postherpetic trigeminal neuralgia: Secondary | ICD-10-CM | POA: Diagnosis not present

## 2017-02-04 DIAGNOSIS — Z9989 Dependence on other enabling machines and devices: Secondary | ICD-10-CM

## 2017-02-04 DIAGNOSIS — G500 Trigeminal neuralgia: Secondary | ICD-10-CM | POA: Diagnosis not present

## 2017-02-04 DIAGNOSIS — G4733 Obstructive sleep apnea (adult) (pediatric): Secondary | ICD-10-CM

## 2017-02-04 MED ORDER — TEGRETOL 200 MG PO TABS: ORAL_TABLET | ORAL | 5 refills | Status: DC

## 2017-02-04 NOTE — Patient Instructions (Signed)
Trigeminal Neuralgia Trigeminal neuralgia is a nerve disorder that causes attacks of severe facial pain. The attacks last from a few seconds to several minutes. They can happen for days, weeks, or months and then go away for months or years. Trigeminal neuralgia is also called tic douloureux. What are the causes? This condition is caused by damage to a nerve in the face that is called the trigeminal nerve. An attack can be triggered by:  Talking.  Chewing.  Putting on makeup.  Washing your face.  Shaving your face.  Brushing your teeth.  Touching your face. What increases the risk? This condition is more likely to develop in:  Women.  People who are 49 years of age or older. What are the signs or symptoms? The main symptom of this condition is pain in the jaw, lips, eyes, nose, scalp, forehead, and face. The pain may be intense, stabbing, electric, or shock-like. How is this diagnosed? This condition is diagnosed with a physical exam. A CT scan or MRI may be done to rule out other conditions that can cause facial pain. How is this treated? This condition may be treated with:  Avoiding the things that trigger your attacks.  Pain medicine.  Surgery. This may be done in severe cases if other medical treatment does not provide relief. Follow these instructions at home:  Take over-the-counter and prescription medicines only as told by your health care provider.  If you wish to get pregnant, talk with your health care provider before you start trying to get pregnant.  Avoid the things that trigger your attacks. It may help to:  Chew on the unaffected side of your mouth.  Avoid touching your face.  Avoid blasts of hot or cold air. Contact a health care provider if:  Your pain medicine is not helping.  You develop new, unexplained symptoms, such as:  Double vision.  Facial weakness.  Changes in hearing or balance.  You become pregnant. Get help right away  if:  Your pain is unbearable, and your pain medicine does not help. This information is not intended to replace advice given to you by your health care provider. Make sure you discuss any questions you have with your health care provider. Document Released: 11/13/2000 Document Revised: 07/19/2016 Document Reviewed: 03/11/2015 Elsevier Interactive Patient Education  2017 [employer].

## 2017-02-04 NOTE — Progress Notes (Signed)
PATIENT: Laura Stuart DOB: 12/20/1945  REASON FOR VISIT: follow up- OSA on CPAP HISTORY FROM: patient  HISTORY OF PRESENT ILLNESS:  HISTORY 07/25/14 [facility]): Laura Stuart is a 71 y.o. female , who is seen here in a regular revisit from Dr. Birdie Riddle for sleep apnea . Interval history : The patient had a relapse , short duration , into trigeminal neuropathy , right face.  This lasted from March to April and was not as painful as in the a past. Now resoled. She continued on her established medications. The geriatric depression score was endorsed at zero. Laura Stuart has been using CPAP now for 6.5 years , her study was in early 2009.  The patient's CPAP download documents a residual AHI of 4.5, average time of daily use of CPAP at 6 hours 56 minutes compliance is 91% for over 4 hours of use.  The patient uses CPAP at a pressure of 7 cm water with 3 cm EPR.  This download encompassed t 90 days, from 04-26-14 07-24-49. The patient uses a nasal pillow.  Interval history from 12/28/2016, Laura Stuart is here to follow-up on 3 different neurologic concerns  #1 a flare up of right sided trigeminal neuralgia involving the middle branch of the trigeminal nerve. This has been controlled for the last 15 years on Tegretol she has changed between different generic manufactures. She does wonder if there was a less effective medication given to her that allowed the breakthrough. We will order today a Tegretol level, CBC to rule out leukopenia, and a metabolic panel to rule out hyponatremia. I will refill the prescription.  The patient also had calmly mentioned that she had some word finding difficulties when she last spoke to my nurse practitioner Megan in July 2017. For this reason a Montral cognitive assessment was obtained, the patient scored 29 out of 30 points she missed 1 point in the 5 word recall. This is an excellent result. Trail making and attention were intact.   The patient  follows for sleep apnea but  No longer able to download her machine. She has to go through advanced home care and that this today. The machine is currently set at 7 cm water pressure with 3 cm EPR, average daily usage is 5 hours, 60% of the time she uses his over 4 hours, residual AHI is 4.3 there is no breakdown into what kind of apneas may be residual, most events are not apneas but shallow breathing. This download encompassed 66 days, she had one gaps. Of one week in early January. Otherwise she seems to be a compliant user. She does have high air leaks. I would suggest to increase the pressure from 7-8 cm water to address the residual apneas. Just wrote order to [facility].  The Epworth sleepiness score is 0, fatigue severity score is 7, geriatric depression score is 0 out of 15.  Interval history from 02/04/2017, Laura Stuart reports that the trigeminal neuralgia has worsened and the area that  is affected is enlarged. It involves an area over the upper lip of the right side of the face as well as above the right eyebrow radiating slightly towards the temple not reaching the retroauricular region or the maxillary area. She was treated on Gabapentin  before she changed to Tegretol.  I will increase the dose. The  last  " trigeminal attack"  was 12 years ago.   PS:The needs to be no CPAP download today, we had just seen the patient on January  29 of this year. No concern about memory testing today. No MOCA.      REVIEW OF SYSTEMS: Out of a complete 14 system review of symptoms, the patient complains only of the following symptoms, and all other reviewed systems are negative.  nasal allergies, trigeminal pain.   ALLERGIES: Allergies  Allergen Reactions  . Penicillins     From allergy testing    HOME MEDICATIONS: Outpatient Medications Prior to Visit  Medication Sig Dispense Refill  . Halcinonide (HALOG) 0.1 % CREA Apply topically.      . TEGRETOL 200 MG tablet Take 1 tablet (200 mg total) by mouth 3  (three) times daily. 90 tablet 5  . zolpidem (AMBIEN) 10 MG tablet Take 1 tablet (10 mg total) by mouth at bedtime as needed for sleep. 30 tablet 5  . Calcium Carb-Cholecalciferol (OYSTERCAL-D PO) Take 3 tablets by mouth daily. 3000mg  calcium-750mg  D    . cholecalciferol (VITAMIN D) 1000 UNITS tablet Take 1,000 Units by mouth daily.     No facility-administered medications prior to visit.     PAST MEDICAL HISTORY: Past Medical History:  Diagnosis Date  . Allergy   . Atypical facial pain   . Neuralgia    Trigeminal  . Sleep apnea    using cpap,OSA  . Trigeminal neuralgia     PAST SURGICAL HISTORY: Past Surgical History:  Procedure Laterality Date  . ROOT CANAL  06/2013  . toes right foot  2010  . TONSILLECTOMY AND ADENOIDECTOMY  1950  . torn miniscus  2003  . VAGINAL DELIVERY     X2    FAMILY HISTORY: Family History  Problem Relation Age of Onset  . Stroke Mother   . Brain cancer Mother   . Lung cancer Mother   . Heart attack Mother   . Emphysema Father   . Alzheimer's disease Sister   . Dementia Other     evident after a fall with a hip fracture  . Colon cancer Neg Hx     SOCIAL HISTORY: Social History   Social History  . Marital status: Married    Spouse name: Laura Stuart  . Number of children: 2  . Years of education: Grad sch.   Occupational History  .      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 Smoking status: Never Smoker  . Smokeless tobacco: Never Used  . Alcohol use 0.0 oz/week     Comment: 1 glass every 2 months; socially  . Drug use: No  . Sexual activity: Not on file   Other Topics Concern  . Not on file   Social History Narrative   Patient is married Laura Stuart) and lives at home with her husband.   Patient has two adult children.   Patient is retired.   Patient has a Production designer, theatre/television/film.   Patient is right-handed.   Patient drinks 1 1/2 cups of coffee daily.        PHYSICAL EXAM  Vitals:   02/04/17 0832  BP: 129/65  Pulse: 73  Resp: 20  Weight: 171 lb (77.6 kg)  Height: 5\' 5"  (1.651 m)   Body mass index is 28.46 kg/m.  Generalized: Well developed, in no acute distress  Patient neck circumference is  13.5  , Mallampati 2-3 , reddened and swollen uvula , but not elongated.   Neurological examination  Mentation: Alert oriented to time, place, history taking. Follows all  commands speech and language fluent Cranial nerve ; no change in taste or smell.   Pupils were equal round reactive to light. Extraocular movements were full, visual field were full on confrontational test.  No nystagmus, no ptosis.  Her right face appears slightly puffy and she is more guarded and smiling. The sensation is abnormal due to the painful area above the right lip and above the right eyebrow. The center of the trigeminal ganglion area however is not affected. With opening and closing of the jaw there is a little electric using that she can feel. It has not affected her taste or smell. Muscle tone is regular, no rigidity, no cogwheeling noted no tremors.  Good grip strength bilaterally.  She has no  gait ataxia, lightheadedness, no recent falls.  DIAGNOSTIC DATA (LABS, IMAGING, TESTING) - I reviewed patient records, labs, notes, testing and imaging myself where available.  Montreal Cognitive Assessment  12/28/2016  Visuospatial/ Executive (0/5) 5  Naming (0/3) 3  Attention: Read list of digits (0/2) 2  Attention: Read list of letters (0/1) 1  Attention: Serial 7 subtraction starting at 100 (0/3) 3  Language: Repeat phrase (0/2) 2  Language : Fluency (0/1) 1  Abstraction (0/2) 2  Delayed Recall (0/5) 4  Orientation (0/6) 6  Total 29  Adjusted Score (based on education) 29     Lab Results  Component Value Date   WBC 4.1 12/28/2016   HGB 12.0 08/20/2016   HCT 37.1 12/28/2016   MCV 92 12/28/2016   PLT 284 12/28/2016      Component Value Date/Time    NA 143 12/28/2016 0939   K 4.3 12/28/2016 0939   CL 105 12/28/2016 0939   CO2 26 12/28/2016 0939   GLUCOSE 92 12/28/2016 0939   GLUCOSE 87 08/20/2016 1042   BUN 19 12/28/2016 0939   CREATININE 0.85 12/28/2016 0939   CREATININE 0.79 02/05/2013 1117   CALCIUM 9.4 12/28/2016 0939   PROT 6.2 12/28/2016 0939   ALBUMIN 4.0 12/28/2016 0939   AST 19 12/28/2016 0939   ALT 15 12/28/2016 0939   ALKPHOS 77 12/28/2016 0939   BILITOT <0.2 12/28/2016 0939   GFRNONAA 70 12/28/2016 0939   GFRAA 80 12/28/2016 0939   Lab Results  Component Value Date   CHOL 203 (H) 08/20/2016   HDL 64.60 08/20/2016   LDLCALC 119 (H) 08/20/2016   LDLDIRECT 106.9 05/22/2013   TRIG 100.0 08/20/2016   CHOLHDL 3 08/20/2016      ASSESSMENT AND PLAN 71 y.o. year old female  has a past medical history of Allergy; Atypical facial pain; Neuralgia; Sleep apnea; and Trigeminal neuralgia. here with:  1. Trigeminal neuralgia, over 15 year history. No longer controlled on current dose of tegretol.  2. Sleep apnea, OSA on CPAP, not interrogated today.  3. Memory test, per patient and husbands report of cognitive difficulties. Not repeated.     She will continue on a higher dose  Tegretol.  She will follow-up in 6 months or sooner if needed.   Nila Winker, MD   02/04/2017, 8:34 AM [facility] [address] [place], [place] [address] [phone]

## 2017-02-24 ENCOUNTER — Telehealth: Payer: Self-pay | Admitting: Neurology

## 2017-02-24 DIAGNOSIS — G4733 Obstructive sleep apnea (adult) (pediatric): Secondary | ICD-10-CM

## 2017-02-24 DIAGNOSIS — G500 Trigeminal neuralgia: Secondary | ICD-10-CM

## 2017-02-24 DIAGNOSIS — Z9989 Dependence on other enabling machines and devices: Principal | ICD-10-CM

## 2017-02-24 MED ORDER — TEGRETOL 200 MG PO TABS: ORAL_TABLET | ORAL | 0 refills | Status: DC

## 2017-02-24 NOTE — Telephone Encounter (Signed)
Pt said she is in [place] and did not bring enough TEGRETOL 200 MG tablet. She needs [facility] pills [facility] to [facility] (p) [phone]  [place]

## 2017-02-24 NOTE — Telephone Encounter (Signed)
I called [facility] in [place], got the fax number of [phone]. Will fax the RX for 8 tablets to this [facility].

## 2017-02-25 DIAGNOSIS — Z880 Allergy status to penicillin: Secondary | ICD-10-CM | POA: Diagnosis not present

## 2017-02-25 DIAGNOSIS — R509 Fever, unspecified: Secondary | ICD-10-CM | POA: Diagnosis not present

## 2017-02-25 DIAGNOSIS — J029 Acute pharyngitis, unspecified: Secondary | ICD-10-CM | POA: Diagnosis not present

## 2017-02-25 DIAGNOSIS — J020 Streptococcal pharyngitis: Secondary | ICD-10-CM | POA: Diagnosis not present

## 2017-02-25 DIAGNOSIS — R5383 Other fatigue: Secondary | ICD-10-CM | POA: Diagnosis not present

## 2017-02-25 DIAGNOSIS — M1990 Unspecified osteoarthritis, unspecified site: Secondary | ICD-10-CM | POA: Diagnosis not present

## 2017-02-25 DIAGNOSIS — G4730 Sleep apnea, unspecified: Secondary | ICD-10-CM | POA: Diagnosis not present

## 2017-02-26 ENCOUNTER — Observation Stay (HOSPITAL_COMMUNITY)
Admission: EM | Admit: 2017-02-26 | Discharge: 2017-02-27 | Disposition: A | Discharge status: Home / Self Care | Payer: PPO | Attending: Internal Medicine | Admitting: Internal Medicine

## 2017-02-26 ENCOUNTER — Observation Stay (HOSPITAL_COMMUNITY): Payer: PPO

## 2017-02-26 ENCOUNTER — Emergency Department (HOSPITAL_COMMUNITY): Payer: PPO

## 2017-02-26 DIAGNOSIS — E876 Hypokalemia: Secondary | ICD-10-CM | POA: Insufficient documentation

## 2017-02-26 DIAGNOSIS — G459 Transient cerebral ischemic attack, unspecified: Secondary | ICD-10-CM | POA: Diagnosis not present

## 2017-02-26 DIAGNOSIS — G500 Trigeminal neuralgia: Secondary | ICD-10-CM | POA: Diagnosis not present

## 2017-02-26 DIAGNOSIS — Z7982 Long term (current) use of aspirin: Secondary | ICD-10-CM | POA: Diagnosis not present

## 2017-02-26 DIAGNOSIS — E669 Obesity, unspecified: Secondary | ICD-10-CM | POA: Insufficient documentation

## 2017-02-26 DIAGNOSIS — Z6829 Body mass index (BMI) 29.0-29.9, adult: Secondary | ICD-10-CM | POA: Diagnosis not present

## 2017-02-26 DIAGNOSIS — I10 Essential (primary) hypertension: Secondary | ICD-10-CM | POA: Diagnosis not present

## 2017-02-26 DIAGNOSIS — R410 Disorientation, unspecified: Secondary | ICD-10-CM | POA: Diagnosis not present

## 2017-02-26 DIAGNOSIS — D649 Anemia, unspecified: Secondary | ICD-10-CM | POA: Insufficient documentation

## 2017-02-26 DIAGNOSIS — M792 Neuralgia and neuritis, unspecified: Secondary | ICD-10-CM | POA: Diagnosis not present

## 2017-02-26 DIAGNOSIS — Z79899 Other long term (current) drug therapy: Secondary | ICD-10-CM | POA: Diagnosis not present

## 2017-02-26 DIAGNOSIS — R269 Unspecified abnormalities of gait and mobility: Secondary | ICD-10-CM | POA: Diagnosis not present

## 2017-02-26 DIAGNOSIS — J020 Streptococcal pharyngitis: Secondary | ICD-10-CM | POA: Diagnosis not present

## 2017-02-26 DIAGNOSIS — B0222 Postherpetic trigeminal neuralgia: Secondary | ICD-10-CM | POA: Insufficient documentation

## 2017-02-26 DIAGNOSIS — G4733 Obstructive sleep apnea (adult) (pediatric): Secondary | ICD-10-CM

## 2017-02-26 DIAGNOSIS — Z9989 Dependence on other enabling machines and devices: Secondary | ICD-10-CM

## 2017-02-26 DIAGNOSIS — E785 Hyperlipidemia, unspecified: Secondary | ICD-10-CM | POA: Diagnosis not present

## 2017-02-26 LAB — URINALYSIS, ROUTINE W REFLEX MICROSCOPIC
BACTERIA UA: NONE SEEN
BILIRUBIN URINE: NEGATIVE
Glucose, UA: NEGATIVE mg/dL
Ketones, ur: 5 mg/dL — AB
LEUKOCYTES UA: NEGATIVE
Nitrite: NEGATIVE
PROTEIN: 30 mg/dL — AB
SPECIFIC GRAVITY, URINE: 1.018 (ref 1.005–1.030)
pH: 5 (ref 5.0–8.0)

## 2017-02-26 LAB — COMPREHENSIVE METABOLIC PANEL
ALT: 78 U/L — AB (ref 14–54)
AST: 63 U/L — ABNORMAL HIGH (ref 15–41)
Albumin: 3.2 g/dL — ABNORMAL LOW (ref 3.5–5.0)
Alkaline Phosphatase: 76 U/L (ref 38–126)
Anion gap: 7 (ref 5–15)
BUN: 11 mg/dL (ref 6–20)
CHLORIDE: 107 mmol/L (ref 101–111)
CO2: 25 mmol/L (ref 22–32)
Calcium: 8.7 mg/dL — ABNORMAL LOW (ref 8.9–10.3)
Creatinine, Ser: 0.77 mg/dL (ref 0.44–1.00)
GLUCOSE: 93 mg/dL (ref 65–99)
POTASSIUM: 3.6 mmol/L (ref 3.5–5.1)
SODIUM: 139 mmol/L (ref 135–145)
TOTAL PROTEIN: 6 g/dL — AB (ref 6.5–8.1)
Total Bilirubin: 0.3 mg/dL (ref 0.3–1.2)

## 2017-02-26 LAB — I-STAT CHEM 8, ED
BUN: 12 mg/dL (ref 6–20)
Calcium, Ion: 1.06 mmol/L — ABNORMAL LOW (ref 1.15–1.40)
Chloride: 107 mmol/L (ref 101–111)
Creatinine, Ser: 0.7 mg/dL (ref 0.44–1.00)
Glucose, Bld: 93 mg/dL (ref 65–99)
HEMATOCRIT: 32 % — AB (ref 36.0–46.0)
Hemoglobin: 10.9 g/dL — ABNORMAL LOW (ref 12.0–15.0)
Potassium: 3.6 mmol/L (ref 3.5–5.1)
SODIUM: 139 mmol/L (ref 135–145)
TCO2: 24 mmol/L (ref 0–100)

## 2017-02-26 LAB — PROTIME-INR
INR: 1.12
Prothrombin Time: 14.5 seconds (ref 11.4–15.2)

## 2017-02-26 LAB — CBC
HCT: 35.2 % — ABNORMAL LOW (ref 36.0–46.0)
HEMOGLOBIN: 11.3 g/dL — AB (ref 12.0–15.0)
MCH: 28.8 pg (ref 26.0–34.0)
MCHC: 32.1 g/dL (ref 30.0–36.0)
MCV: 89.6 fL (ref 78.0–100.0)
Platelets: 184 10*3/uL (ref 150–400)
RBC: 3.93 MIL/uL (ref 3.87–5.11)
RDW: 13.7 % (ref 11.5–15.5)
WBC: 13 10*3/uL — ABNORMAL HIGH (ref 4.0–10.5)

## 2017-02-26 LAB — INFLUENZA PANEL BY PCR (TYPE A & B)
INFLAPCR: NEGATIVE
Influenza B By PCR: NEGATIVE

## 2017-02-26 LAB — DIFFERENTIAL
Basophils Absolute: 0 10*3/uL (ref 0.0–0.1)
Basophils Relative: 0 %
EOS PCT: 1 %
Eosinophils Absolute: 0.1 10*3/uL (ref 0.0–0.7)
LYMPHS ABS: 1.5 10*3/uL (ref 0.7–4.0)
Lymphocytes Relative: 12 %
MONOS PCT: 9 %
Monocytes Absolute: 1.1 10*3/uL — ABNORMAL HIGH (ref 0.1–1.0)
NEUTROS PCT: 78 %
Neutro Abs: 10.3 10*3/uL — ABNORMAL HIGH (ref 1.7–7.7)

## 2017-02-26 LAB — CREATININE, SERUM: Creatinine, Ser: 0.76 mg/dL (ref 0.44–1.00)

## 2017-02-26 LAB — CBG MONITORING, ED: GLUCOSE-CAPILLARY: 82 mg/dL (ref 65–99)

## 2017-02-26 LAB — I-STAT TROPONIN, ED: TROPONIN I, POC: 0 ng/mL (ref 0.00–0.08)

## 2017-02-26 LAB — APTT: aPTT: 32 seconds (ref 24–36)

## 2017-02-26 LAB — CARBAMAZEPINE LEVEL, TOTAL: CARBAMAZEPINE LVL: 12.7 ug/mL — AB (ref 4.0–12.0)

## 2017-02-26 MED ORDER — CARBAMAZEPINE 200 MG PO TABS: 200.0000 mg | ORAL_TABLET | Freq: Every day | ORAL | Status: DC

## 2017-02-26 MED ORDER — GUAIFENESIN ER 600 MG PO TB12
600.0000 mg | ORAL_TABLET | Freq: Two times a day (BID) | ORAL | Status: DC
  Administered 2017-02-26 – 2017-02-27 (×2): 600 mg via ORAL
  Filled 2017-02-26 (×2): qty 1

## 2017-02-26 MED ORDER — ONDANSETRON HCL 4 MG PO TABS: 4.0000 mg | ORAL_TABLET | Freq: Four times a day (QID) | ORAL | Status: DC | PRN

## 2017-02-26 MED ORDER — LORAZEPAM 2 MG/ML IJ SOLN: 1.0000 mg | Freq: Four times a day (QID) | INTRAMUSCULAR | Status: DC | PRN

## 2017-02-26 MED ORDER — ENOXAPARIN SODIUM 40 MG/0.4ML ~~LOC~~ SOLN
40.0000 mg | SUBCUTANEOUS | Status: DC
  Administered 2017-02-26: 40 mg via SUBCUTANEOUS
  Filled 2017-02-26: qty 0.4

## 2017-02-26 MED ORDER — ACETAMINOPHEN 325 MG PO TABS
650.0000 mg | ORAL_TABLET | Freq: Four times a day (QID) | ORAL | Status: DC | PRN
  Administered 2017-02-27: 650 mg via ORAL
  Filled 2017-02-26: qty 2

## 2017-02-26 MED ORDER — HYDRALAZINE HCL 20 MG/ML IJ SOLN: 10.0000 mg | Freq: Three times a day (TID) | INTRAMUSCULAR | Status: DC | PRN

## 2017-02-26 MED ORDER — ONDANSETRON HCL 4 MG/2ML IJ SOLN: 4.0000 mg | Freq: Four times a day (QID) | INTRAMUSCULAR | Status: DC | PRN

## 2017-02-26 MED ORDER — CARBAMAZEPINE 200 MG PO TABS
400.0000 mg | ORAL_TABLET | Freq: Every day | ORAL | Status: DC
  Administered 2017-02-26: 400 mg via ORAL
  Filled 2017-02-26: qty 2

## 2017-02-26 MED ORDER — SODIUM CHLORIDE 0.9 % IV SOLN
INTRAVENOUS | Status: AC
  Administered 2017-02-26: 20:00:00 via INTRAVENOUS

## 2017-02-26 MED ORDER — SODIUM CHLORIDE 0.9 % IV BOLUS (SEPSIS)
1000.0000 mL | Freq: Once | INTRAVENOUS | Status: AC
  Administered 2017-02-26: 1000 mL via INTRAVENOUS

## 2017-02-26 MED ORDER — DEXTROSE 5 % IV SOLN
1.0000 g | INTRAVENOUS | Status: DC
  Administered 2017-02-26: 1 g via INTRAVENOUS
  Filled 2017-02-26 (×2): qty 10

## 2017-02-26 MED ORDER — SODIUM CHLORIDE 0.9% FLUSH
3.0000 mL | Freq: Two times a day (BID) | INTRAVENOUS | Status: DC
  Administered 2017-02-26: 3 mL via INTRAVENOUS

## 2017-02-26 MED ORDER — ACETAMINOPHEN 650 MG RE SUPP: 650.0000 mg | Freq: Four times a day (QID) | RECTAL | Status: DC | PRN

## 2017-02-26 NOTE — ED Notes (Signed)
Returned from ct scan 

## 2017-02-26 NOTE — ED Triage Notes (Signed)
Pt was diagnosed with strep throat yesterday at a hospital in  at noon she began having a sudden onset of walking "like a drunken sailor and was not able to say the day of week, was unable to recognize her car-she was speaking very slowly" She is resolving now and still slightly off balance. Dr. Eulis Foster at bedside at triage.  Denies numbness, tinglings, no facial droop or arm drift.

## 2017-02-26 NOTE — ED Notes (Signed)
Patient transported to MRI 

## 2017-02-26 NOTE — ED Notes (Signed)
Patient transported to CT 

## 2017-02-26 NOTE — ED Provider Notes (Signed)
14: 08-called to triage, to see patient regarding possible code stroke.  Patient was standing, turned and then began having trouble walking, was confused, and was subsequently brought here by private vehicle, by her husband.  She was traveling, and arrived from a long car trip, several hours ago, and had been playing with her granddaughter before onset of symptoms.  She was evaluated yesterday, by a physician and diagnosed with strep throat, treated with penicillin tablets.  Patient describes being somewhat aggravated by being unable to find her penicillin this morning and her granddaughters tenderness.  Patient's husband with her and gives history and agrees that the patient has improved upon assessment at this time.  Exam-alert, lucid, cooperative.  No dysarthria aphasia or nystagmus.  No pronator drift.  Normal strength arms and legs bilaterally.  With ambulation, she drifted once to the left, but was able to self-correct, and continue walking.  Plan-screening, for occult CVA, with routine order set, and close follow-up by provider team after this initial screening evaluation.  Patient does not meet criteria for thrombolysis at this time secondary to improving symptoms, and estimated very low NIH score.   Daleen Bo, MD 02/26/17 [phone]

## 2017-02-26 NOTE — H&P (Signed)
[facility] History and Physical  Laura Stuart PPJ:[id number] DOB: 02-09-1946 DOA: 02/26/2017  Referring physician:  PCP: [name], MD   Chief Complaint: "I just couldn't get my foot right."  HPI: Laura Stuart is a 71 y.o. female  with past history of trigeminal neuralgia, sleep apnea, allergies presents with abnormal movements or weakness. Patient states she was in normal state of health until less than 5 days ago to a road trip with her family. She developed a sore throat and was seen in urgent care. She was diagnosed with strep and started on antibiotics. The day she returned home she rode in a car with a family member who was diagnosed with flu for 13 hours. The following morning she was extremely weak and could not walk. She also had trouble talking. Family brought her to the emergency room for evaluation.  No history of strokes. Denies history of head trauma. No history of concussion.  ED course: Flu swab ordered. Patient given IV fluids. CT negative for acute stroke. Hospitalist consultation for admission.  Review of Systems:  As per HPI otherwise 10 point review of systems negative.    Past Medical History:  Diagnosis Date  . Allergy   . Atypical facial pain   . Neuralgia    Trigeminal  . Sleep apnea    using cpap,OSA  . Trigeminal neuralgia    Past Surgical History:  Procedure Laterality Date  . ROOT CANAL  06/2013  . toes right foot  2010  . TONSILLECTOMY AND ADENOIDECTOMY  1950  . torn miniscus  2003  . VAGINAL DELIVERY     X2   Social History:  reports that she has never smoked. She has never used smokeless tobacco. She reports that she drinks alcohol. She reports that she does not use drugs.  Allergies  Allergen Reactions  . Penicillins     From allergy testing    Family History  Problem Relation Age of Onset  . Stroke Mother   . Brain cancer Mother   . Lung cancer Mother   . Heart attack Mother   . Emphysema Father   .  Alzheimer's disease Sister   . Dementia Other     evident after a fall with a hip fracture  . Colon cancer Neg Hx      Prior to Admission medications   Medication Sig Start Date End Date Taking? Authorizing Provider  Calcium Carb-Cholecalciferol (CALCIUM CARBONATE-VITAMIN D3 PO) Take by mouth.    Historical Provider, MD  Halcinonide (HALOG) 0.1 % CREA Apply topically.      Historical Provider, MD  TEGRETOL 200 MG tablet Take 200 in AM and 200 mg at lunch time, 400 mg at night. 02/24/17   Larey Seat, MD  zolpidem (AMBIEN) 10 MG tablet Take 1 tablet (10 mg total) by mouth at bedtime as needed for sleep. 08/04/16   Ward Givens, NP   Physical Exam: Vitals:   02/26/17 1400  BP: 125/65  Pulse: 80  Resp: 18  Temp: 99 F (37.2 C)  TempSrc: Oral  SpO2: 99%    Wt Readings from Last 3 Encounters:  02/04/17 77.6 kg (171 lb)  12/28/16 78 kg (172 lb)  10/26/16 75.8 kg (167 lb)    General:  Appears calm and comfortable, alert and oriented 3 Eyes:  PERRL, EOMI, normal lids, iris ENT:  grossly normal hearing, lips & tongue, nasal mucosal edema Neck:  no LAD, masses or thyromegaly Cardiovascular:  RRR, no m/r/g. No LE edema.  Respiratory:  CTA bilaterally, no w/r/r. Normal respiratory effort. Abdomen:  soft, ntnd Skin:  no rash or induration seen on limited exam Musculoskeletal:  grossly normal tone BUE/BLE Psychiatric:  grossly normal mood and affect, speech fluent and appropriate Neurologic:  CN 2-12 grossly intact, moves all extremities in coordinated fashion.          Labs on Admission:  Basic Metabolic Panel:  Recent Labs Lab 02/26/17 1450 02/26/17 1514  NA 139 139  K 3.6 3.6  CL 107 107  CO2 25  --   GLUCOSE 93 93  BUN 11 12  CREATININE 0.77 0.70  CALCIUM 8.7*  --    Liver Function Tests:  Recent Labs Lab 02/26/17 1450  AST 63*  ALT 78*  ALKPHOS 76  BILITOT 0.3  PROT 6.0*  ALBUMIN 3.2*   No results for input(s): LIPASE, AMYLASE in the last 168  hours. No results for input(s): AMMONIA in the last 168 hours. CBC:  Recent Labs Lab 02/26/17 1450 02/26/17 1514  WBC 13.0*  --   NEUTROABS 10.3*  --   HGB 11.3* 10.9*  HCT 35.2* 32.0*  MCV 89.6  --   PLT 184  --    Cardiac Enzymes: No results for input(s): CKTOTAL, CKMB, CKMBINDEX, TROPONINI in the last 168 hours.  BNP (last 3 results) No results for input(s): BNP in the last 8760 hours.  ProBNP (last 3 results) No results for input(s): PROBNP in the last 8760 hours.   Creatinine clearance cannot be calculated (Unknown ideal weight.)  CBG:  Recent Labs Lab 02/26/17 1455  GLUCAP 82    Radiological Exams on Admission: Ct Head Wo Contrast  Result Date: 02/26/2017 CLINICAL DATA:  Sudden onset of imbalance. EXAM: CT HEAD WITHOUT CONTRAST TECHNIQUE: Contiguous axial images were obtained from the base of the skull through the vertex without intravenous contrast. COMPARISON:  None. FINDINGS: Brain: Scattered periventricular hypodensities, most conspicuous about the anterior horn of left lateral ventricle. Gray-white differentiation is otherwise well maintained. No CT evidence of acute large territory infarct. No intraparenchymal or extra-axial mass or hemorrhage. Normal size and configuration of the ventricles and the basilar cisterns. No midline shift. Vascular: No hyperdense vessel or unexpected calcification. Skull: No displaced calvarial fracture. Sinuses/Orbits: Small air-fluid level within the sphenoid sinus. The remaining paranasal sinuses and mastoid air cells are normally aerated. Other: Regional soft tissues appear normal. IMPRESSION: 1. 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 2. Small air-fluid level within the sphenoid sinus as could be seen in the setting of acute sinusitis. Electronically Signed   By: Sandi Mariscal M.D.   On: 02/26/2017 14:45    EKG: Independently reviewed. RBBB. No STEMI  Assessment/Plan Active Problems:   TIA (transient  ischemic attack)  TIA/Stroke-like sx CT head negative for acute bleed MRI ordered Aspirin full dose given  And daily A1c ordered Lipid panel ordered Admitted to telemetry bed Echo ordered Carotid Dopplers ordered MRA ordered Stroke team to follow patient Flu swab& UA ordered Neuro consult Prn hydralazine Flu pending  URI/Strep Rocephin for strep Mucinex  Neuralgia Continue Tegretol Check Tegretol level When necessary Ativan Seizure precautions Hx of "seizures"   Code Status: FULL - no trach placement if unable to extubate DVT Prophylaxis: Lovenox Family Communication: husband at bedside Disposition Plan: Pending Improvement  Status: obs tele  Elwin Mocha, MD Family Medicine [facility] [email or web address] Password [email or web address]

## 2017-02-26 NOTE — Consult Note (Signed)
Requesting Physician: Dr. Eulis Foster    Chief Complaint: TIA  History obtained from:  Patient    HPI:                                                                                                                                         Laura Stuart is an 71 y.o. female who recently visited her daughter in [place]. This morning patient was feeling okay however she was diagnosed with strep throat 2 days ago and was placed on penicillin. She had not been drinking much water secondary to the throat soreness however she was drinking some water. Patient got up this morning and was packing the car. She states that she made multiple trips and felt fine. At about 11:45 when her husband arrived he noted that her gait was very unstable when she stood up. He described her as walking like a drunk person. Patient states that she did feel dizzy however she did not have any vertiginous symptoms. She has felt dizzy when standing before such as presyncope but denies that this was the feeling that she was having at this time. She stated" I felt like I was trying to get my legs to move in certain directions but they just wouldn't go away and wanted them to". Patient has never had symptoms like this before. Symptoms lasted for approximately 30-40 minutes. Patient denies any ataxic speech, dysarthria, blurred vision, diplopia, numbness or tingling, weakness in upper extremities or lower extremities, or difficulty swallowing. At the time that she arrived to the hospital today she was feeling better and she no longer has these symptoms.  Of note she states that she's been told she is allergic to penicillin in the past but has no idea what that meant. She has had penicillin in the past. She is also had had the penicillin since the event and has had no problems.  Date last known well: Date: 02/26/2017 Time last known well: Time: 11:45 tPA Given: No: symptoms resolved   Past Medical History:  Diagnosis Date  .  Allergy   . Atypical facial pain   . Neuralgia    Trigeminal  . Sleep apnea    using cpap,OSA  . Trigeminal neuralgia     Past Surgical History:  Procedure Laterality Date  . ROOT CANAL  06/2013  . toes right foot  2010  . TONSILLECTOMY AND ADENOIDECTOMY  1950  . torn miniscus  2003  . VAGINAL DELIVERY     X2    Family History  Problem Relation Age of Onset  . Stroke Mother   . Brain cancer Mother   . Lung cancer Mother   . Heart attack Mother   . Emphysema Father   . Alzheimer's disease Sister   . Dementia Other     evident after a fall with a hip fracture  . Colon cancer Neg Hx  Social History:  reports that she has never smoked. She has never used smokeless tobacco. She reports that she drinks alcohol. She reports that she does not use drugs.  Allergies:  Allergies  Allergen Reactions  . Penicillins     From allergy testing    Medications:                                                                                                                           Current Facility-Administered Medications  Medication Dose Route Frequency Provider Last Rate Last Dose  . sodium chloride 0.9 % bolus 1,000 mL  1,000 mL Intravenous Once Jenifer Algernon Huxley, MD       Current Outpatient Prescriptions  Medication Sig Dispense Refill  . Calcium Carb-Cholecalciferol (CALCIUM CARBONATE-VITAMIN D3 PO) Take by mouth.    . Halcinonide (HALOG) 0.1 % CREA Apply topically.      . TEGRETOL 200 MG tablet Take 200 in AM and 200 mg at lunch time, 400 mg at night. 8 tablet 0  . zolpidem (AMBIEN) 10 MG tablet Take 1 tablet (10 mg total) by mouth at bedtime as needed for sleep. 30 tablet 5     ROS:                                                                                                                                       History obtained from the patient  General ROS: Positive for - chills, fatigue,  Psychological ROS: negative for - behavioral disorder, hallucinations,  memory difficulties, mood swings or suicidal ideation Ophthalmic ROS: negative for - blurry vision, double vision, eye pain or loss of vision ENT ROS: positivefor - Sore throat Allergy and Immunology ROS: negative for - hives or itchy/watery eyes Hematological and Lymphatic ROS: negative for - bleeding problems, bruising or swollen lymph nodes Endocrine ROS: negative for - galactorrhea, hair pattern changes, polydipsia/polyuria or temperature intolerance Respiratory ROS: negative for - cough, hemoptysis, shortness of breath or wheezing Cardiovascular ROS: negative for - chest pain, dyspnea on exertion, edema or irregular heartbeat Gastrointestinal ROS: negative for - abdominal pain, diarrhea, hematemesis, nausea/vomiting or stool incontinence Genito-Urinary ROS: negative for - dysuria, hematuria, incontinence or urinary frequency/urgency Musculoskeletal ROS: negative for - joint swelling or muscular weakness Neurological ROS: as noted in HPI Dermatological ROS: negative for rash and skin lesion changes  Neurologic Examination:  Blood pressure 125/65, pulse 80, temperature 99 F (37.2 C), temperature source Oral, resp. rate 18, SpO2 99 %.  HEENT-  Normocephalic, no lesions, without obvious abnormality.  Normal external eye and conjunctiva.  Normal TM's bilaterally.  Normal auditory canals and external ears. Normal external nose, mucus membranes and septum.  Normal pharynx. Cardiovascular- S1, S2 normal, pulses palpable throughout   Lungs- chest clear, no wheezing, rales, normal symmetric air entry Abdomen- normal findings: bowel sounds normal Extremities- no edema Lymph-no adenopathy palpable Musculoskeletal-no joint tenderness, deformity or swelling Skin-warm and dry, no hyperpigmentation, vitiligo, or suspicious lesions  Neurological Examination Mental Status: Alert, oriented, thought  content appropriate.  Speech fluent without evidence of aphasia.  Able to follow 3 step commands without difficulty. Cranial Nerves: II:  Visual fields grossly normal,  III,IV, VI: ptosis not present, extra-ocular motions intact bilaterally, pupils equal, round, reactive to light and accommodation V,VII: smile symmetric, facial light touch sensation normal bilaterally VIII: hearing normal bilaterally IX,X: uvula rises symmetrically XI: bilateral shoulder shrug XII: midline tongue extension Motor: Right : Upper extremity   5/5    Left:     Upper extremity   5/5  Lower extremity   5/5     Lower extremity   5/5 Tone and bulk:normal tone throughout; no atrophy noted Sensory: Pinprick and light touch intact throughout, bilaterally Deep Tendon Reflexes: 2+ and symmetric throughout Plantars: Right: downgoing   Left: downgoing Cerebellar: normal finger-to-nose, normal rapid alternating movements and normal heel-to-shin test Gait: normal gait        Lab Results: Basic Metabolic Panel:  Recent Labs Lab 02/26/17 1514  NA 139  K 3.6  CL 107  GLUCOSE 93  BUN 12  CREATININE 0.70    Liver Function Tests: No results for input(s): AST, ALT, ALKPHOS, BILITOT, PROT, ALBUMIN in the last 168 hours. No results for input(s): LIPASE, AMYLASE in the last 168 hours. No results for input(s): AMMONIA in the last 168 hours.  CBC:  Recent Labs Lab 02/26/17 1450 02/26/17 1514  WBC 13.0*  --   NEUTROABS 10.3*  --   HGB 11.3* 10.9*  HCT 35.2* 32.0*  MCV 89.6  --   PLT 184  --     Cardiac Enzymes: No results for input(s): CKTOTAL, CKMB, CKMBINDEX, TROPONINI in the last 168 hours.  Lipid Panel: No results for input(s): CHOL, TRIG, HDL, CHOLHDL, VLDL, LDLCALC in the last 168 hours.  CBG:  Recent Labs Lab 02/26/17 1455  GLUCAP 82    Microbiology: Results for orders placed or performed in visit on 02/05/13  Clostridium difficile EIA     Status: None   Collection Time: 02/05/13  12:00 PM  Result Value Ref Range Status   CDIFTX NEGATIVE  Final  Stool culture     Status: None   Collection Time: 02/05/13  3:39 PM  Result Value Ref Range Status   Organism ID, Bacteria No Salmonella,Shigella,Campylobacter,Yersinia,or  Final   Organism ID, Bacteria No E.coli [id number]:H7 isolated.  Final    Coagulation Studies: No results for input(s): LABPROT, INR in the last 72 hours.  Imaging: Ct Head Wo Contrast  Result Date: 02/26/2017 CLINICAL DATA:  Sudden onset of imbalance. EXAM: CT HEAD WITHOUT CONTRAST TECHNIQUE: Contiguous axial images were obtained from the base of the skull through the vertex without intravenous contrast. COMPARISON:  None. FINDINGS: Brain: Scattered periventricular hypodensities, most conspicuous about the anterior horn of left lateral ventricle. Gray-white differentiation is otherwise well maintained. No CT evidence of acute large territory  infarct. No intraparenchymal or extra-axial mass or hemorrhage. Normal size and configuration of the ventricles and the basilar cisterns. No midline shift. Vascular: No hyperdense vessel or unexpected calcification. Skull: No displaced calvarial fracture. Sinuses/Orbits: Small air-fluid level within the sphenoid sinus. The remaining paranasal sinuses and mastoid air cells are normally aerated. Other: Regional soft tissues appear normal. IMPRESSION: 1. 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 2. Small air-fluid level within the sphenoid sinus as could be seen in the setting of acute sinusitis. Electronically Signed   By: Sandi Mariscal M.D.   On: 02/26/2017 14:45       Assessment and plan discussed with with attending physician and they are in agreement.    Etta Quill PA-C [facility] [phone]  02/26/2017, 3:37 PM   Assessment: 71 y.o. female with transient gait instability lasting for approximately 40 minutes.  1. CT of head was negative while in the ED.  2. MRI/MRA  of the brain  without contrast - no acute stroke seen. Mild chronic small vessel ischemic changes are noted. Patent circle of Willis. Given no stroke on MRI, TIA is on DDx. Also on DDx is confusion as side effect of penicillin or secondary to intercurrent illness 3. Strep throat 4. Stroke Risk Factors - none   Recommend: 1. HgbA1c, fasting lipid panel 2. NPO until passes stroke swallow screen 3. PT consult, OT consult, Speech consult 4. Echocardiogram 5. Carotid dopplers 6. Prophylactic therapy-Antiplatelet med: Aspirin - dose 81 mg daily 7. Risk factor modification 8. Telemetry monitoring 9. Frequent neuro checks 10. Please page stroke NP  Or  PA  Or MD from 8am -4 pm  as this patient from this time will be  followed by the stroke.   You can look them up on [email or web address]  Password [email or web address]   I have seen and examined the patient and agree with the examination findings and plan documented above. Electronically signed: Dr. Kerney Elbe

## 2017-02-26 NOTE — ED Provider Notes (Signed)
[facility] DEPT Provider Note   CSN: [id number] Arrival date & time: 02/26/17  1338     History   Chief Complaint Chief Complaint  Patient presents with  . Dizziness    HPI Laura Stuart is a 71 y.o. female.  HPI  71 year old female with history of trigeminal neuralgia followed by neurology who presents status post event of gait disturbance and word finding difficulty that was witnessed by her husband who supplements the history. They state that the patient recently returned from [place], and they were unpacking her car at around noon when she became unsteady on her feet "almost as if she were drunk." Her husband states that it took 2 people to hold her up. Patient denies feeling lightheaded but states she felt very dizzy as if she couldn't walk. Pt's husband also states that she had word finding difficulty as well as confusion. He denies slurred speech. Patient denies vision disturbance, focal weakness, or sensory deficits.  Additionally, patient reports sore throat and generalized weakness for the last 3 days that has worsened over the last day. She was seen at an emergency department in [place] 2 days ago, where she was swabbed for strep throat that was positive and started on penicillin. She was also swabbed for influenza A which at that point was negative. Her daughter subsequently tested positive for influenza A today. Patient endorses occasional chills but does not have fevers. Denies body aches. No chest pain, shortness of breath, cough, nausea, vomiting, diarrhea, dysuria, or frequency.  Past Medical History:  Diagnosis Date  . Allergy   . Atypical facial pain   . Neuralgia    Trigeminal  . Sleep apnea    using cpap,OSA  . Trigeminal neuralgia     Patient Active Problem List   Diagnosis Date Noted  . TIA (transient ischemic attack) 02/26/2017  . 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 Irregular bowel habits 08/20/2015  . Knee pain, right  03/11/2015  . Acute bronchitis 09/11/2013  . Sleep apnea with use of continuous positive airway pressure (CPAP) 07/26/2013  . Insomnia 05/04/2013  . Screening for malignant neoplasm of the cervix 10/29/2011  . Trigeminal neuralgia 07/07/2011  . Sleep apnea 07/07/2011  . Overweight 07/07/2011  . Osteopenia 07/07/2011  . Routine general medical examination at a health care facility 07/07/2011    Past Surgical History:  Procedure Laterality Date  . ROOT CANAL  06/2013  . toes right foot  2010  . TONSILLECTOMY AND ADENOIDECTOMY  1950  . torn miniscus  2003  . VAGINAL DELIVERY     X2    OB History    No data available       Home Medications    Prior to Admission medications   Medication Sig Start Date End Date Taking? Authorizing Provider  Calcium Carb-Cholecalciferol (CALCIUM CARBONATE-VITAMIN D3 PO) Take 1 tablet by mouth daily.    Yes Historical Provider, MD  Halcinonide (HALOG) 0.1 % CREA Apply 1 application topically daily as needed (sparingly for itching).    Yes Historical Provider, MD  TEGRETOL 200 MG tablet Take 200 in AM and 200 mg at lunch time, 400 mg at night. Patient taking differently: Take 200-400 mg by mouth See admin instructions. Take 200 in AM and 200 mg at lunch time, 400 mg at night. 02/24/17  Yes Larey Seat, MD  zolpidem (AMBIEN) 10 MG tablet Take 1 tablet (10 mg total) by mouth at bedtime as needed for sleep. 08/04/16  Yes Ward Givens, NP  Family History Family History  Problem Relation Age of Onset  . Stroke Mother   . Brain cancer Mother   . Lung cancer Mother   . Heart attack Mother   . Emphysema Father   . Alzheimer's disease Sister   . Dementia Other     evident after a fall with a hip fracture  . Colon cancer Neg Hx     Social History Social History  Substance Use Topics  . Smoking status: Never Smoker  . Smokeless tobacco: Never Used  . Alcohol use 0.0 oz/week     Comment: 1 glass every 2 months; socially     Allergies     Penicillins   Review of Systems Review of Systems  Constitutional: Positive for fatigue. Negative for chills and fever.  HENT: Positive for postnasal drip and sore throat. Negative for congestion, ear pain, rhinorrhea, trouble swallowing and voice change.   Eyes: Negative for visual disturbance.  Respiratory: Negative for cough and shortness of breath.   Cardiovascular: Negative for chest pain and palpitations.  Gastrointestinal: Negative for abdominal pain, diarrhea, nausea and vomiting.  Genitourinary: Negative for dysuria and frequency.  Musculoskeletal: Positive for gait problem (resolved). Negative for arthralgias, back pain, myalgias, neck pain and neck stiffness.  Skin: Negative for rash.  Neurological: Positive for dizziness (resolved). Negative for seizures, syncope, facial asymmetry, speech difficulty, weakness, light-headedness, numbness and headaches.       Word-finding difficulty  Psychiatric/Behavioral: Negative for agitation, behavioral problems and confusion.  All other systems reviewed and are negative.    Physical Exam Updated Vital Signs BP (!) 111/59 (BP Location: Right Arm)   Pulse 79   Temp 99.9 F (37.7 C) (Oral)   Resp 18   Ht 5\' 5"  (1.651 m)   Wt 80 kg   SpO2 96%   BMI 29.35 kg/m   Physical Exam  Constitutional: She is oriented to person, place, and time. She appears well-developed and well-nourished. No distress.  HENT:  Head: Normocephalic and atraumatic.  Right Ear: External ear normal.  Left Ear: External ear normal.  R TM normal in appearance. L TM with mild clear effusion, or erythema. S/p adenoidectomy. No oropharyngeal exudates.   Eyes: Conjunctivae and EOM are normal. Pupils are equal, round, and reactive to light.  Neck: Normal range of motion. Neck supple.  No meningismus  Cardiovascular: Normal rate, regular rhythm, normal heart sounds and intact distal pulses.   No murmur heard. Pulmonary/Chest: Effort normal and breath sounds  normal. No respiratory distress. She has no wheezes. She has no rales.  Abdominal: Soft. Bowel sounds are normal. She exhibits no distension. There is no tenderness. There is no guarding.  Musculoskeletal: Normal range of motion. She exhibits no edema, tenderness or deformity.  Neurological: She is alert and oriented to person, place, and time. She exhibits normal muscle tone.  Face symmetric, tongue midline. 5/5 strength in the proximal and distal upper and lower extremities bilaterally, with intact sensation to light touch. Normal finger to nose, heel to shin, and rapid alternating movements. Normal speech. Normal gait.    Skin: Skin is warm and dry.  Psychiatric: She has a normal mood and affect.  Nursing note and vitals reviewed.    ED Treatments / Results  Labs (all labs ordered are listed, but only abnormal results are displayed) Labs Reviewed  CBC - Abnormal; Notable for the following:       Result Value   WBC 13.0 (*)    Hemoglobin 11.3 (*)  HCT 35.2 (*)    All other components within normal limits  DIFFERENTIAL - Abnormal; Notable for the following:    Neutro Abs 10.3 (*)    Monocytes Absolute 1.1 (*)    All other components within normal limits  COMPREHENSIVE METABOLIC PANEL - Abnormal; Notable for the following:    Calcium 8.7 (*)    Total Protein 6.0 (*)    Albumin 3.2 (*)    AST 63 (*)    ALT 78 (*)    All other components within normal limits  CARBAMAZEPINE LEVEL, TOTAL - Abnormal; Notable for the following:    Carbamazepine Lvl 12.7 (*)    All other components within normal limits  URINALYSIS, ROUTINE W REFLEX MICROSCOPIC - Abnormal; Notable for the following:    APPearance HAZY (*)    Hgb urine dipstick SMALL (*)    Ketones, ur 5 (*)    Protein, ur 30 (*)    Squamous Epithelial / LPF 0-5 (*)    All other components within normal limits  I-STAT CHEM 8, ED - Abnormal; Notable for the following:    Calcium, Ion 1.06 (*)    Hemoglobin 10.9 (*)    HCT 32.0  (*)    All other components within normal limits  PROTIME-INR  APTT  INFLUENZA PANEL BY PCR (TYPE A & B)  CREATININE, SERUM  BASIC METABOLIC PANEL  CBC  LIPID PANEL  HEMOGLOBIN A1C  I-STAT TROPOININ, ED  CBG MONITORING, ED  I-STAT TROPOININ, ED    EKG  EKG Interpretation  Date/Time:  Friday February 26 2017 14:46:58 EDT Ventricular Rate:  80 PR Interval:    QRS Duration: 136 QT Interval:  371 QTC Calculation: 428 R Axis:   73 Text Interpretation:  Sinus rhythm Right bundle branch block Baseline wander in lead(s) I III aVL Right bundle branch block New since previous tracing Confirmed by Winfred Leeds  MD, SAM [phone]) on 02/26/2017 3:00:07 PM       Radiology Ct Head Wo Contrast  Result Date: 02/26/2017 CLINICAL DATA:  Sudden onset of imbalance. EXAM: CT HEAD WITHOUT CONTRAST TECHNIQUE: Contiguous axial images were obtained from the base of the skull through the vertex without intravenous contrast. COMPARISON:  None. FINDINGS: Brain: Scattered periventricular hypodensities, most conspicuous about the anterior horn of left lateral ventricle. Gray-white differentiation is otherwise well maintained. No CT evidence of acute large territory infarct. No intraparenchymal or extra-axial mass or hemorrhage. Normal size and configuration of the ventricles and the basilar cisterns. No midline shift. Vascular: No hyperdense vessel or unexpected calcification. Skull: No displaced calvarial fracture. Sinuses/Orbits: Small air-fluid level within the sphenoid sinus. The remaining paranasal sinuses and mastoid air cells are normally aerated. Other: Regional soft tissues appear normal. IMPRESSION: 1. 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 2. Small air-fluid level within the sphenoid sinus as could be seen in the setting of acute sinusitis. Electronically Signed   By: Sandi Mariscal M.D.   On: 02/26/2017 14:45   Mr Jodene Nam Head Wo Contrast  Result Date: 02/26/2017 CLINICAL DATA:   Transient gait instability. EXAM: MRI HEAD WITHOUT CONTRAST MRA HEAD WITHOUT CONTRAST TECHNIQUE: Multiplanar, multiecho pulse sequences of the brain and surrounding structures were obtained without intravenous contrast. Angiographic images of the head were obtained using MRA technique without contrast. COMPARISON:  Head CT 02/26/2017 FINDINGS: MRI HEAD FINDINGS Brain: There is no evidence of acute infarct, intracranial hemorrhage, mass, midline shift, or extra-axial fluid collection. The ventricles and sulci are normal for age. Subcortical and deep  cerebral white matter T2 hyperintensities are nonspecific but compatible with mild chronic small vessel ischemic disease. Vascular: Major intracranial vascular flow voids are preserved. Skull and upper cervical spine: Unremarkable bone marrow signal. Sinuses/Orbits: Unremarkable orbits. Small volume right sphenoid sinus fluid. Small left sphenoid sinus which is essentially completely opacified. Clear mastoid air cells. Other: None. MRA HEAD FINDINGS There is mild motion artifact. The visualized distal vertebral arteries are widely patent to the basilar with the left being moderately to strongly dominant. Patent PICA, AICA, and SCA origins are visualized bilaterally. Basilar artery is widely patent. There is a small right posterior communicating artery. PCAs are patent without evidence of significant stenosis. The internal carotid arteries are widely patent from skullbase to carotid termini. ACAs and MCAs are patent without evidence of major branch occlusion or significant stenosis. No intracranial aneurysm is identified. IMPRESSION: 1. No acute intracranial abnormality. 2. Mild chronic small vessel ischemic disease. 3. Unremarkable head MRA. Electronically Signed   By: Logan Bores M.D.   On: 02/26/2017 18:14   Mr Brain Wo Contrast  Result Date: 02/26/2017 CLINICAL DATA:  Transient gait instability. EXAM: MRI HEAD WITHOUT CONTRAST MRA HEAD WITHOUT CONTRAST TECHNIQUE:  Multiplanar, multiecho pulse sequences of the brain and surrounding structures were obtained without intravenous contrast. Angiographic images of the head were obtained using MRA technique without contrast. COMPARISON:  Head CT 02/26/2017 FINDINGS: MRI HEAD FINDINGS Brain: There is no evidence of acute infarct, intracranial hemorrhage, mass, midline shift, or extra-axial fluid collection. The ventricles and sulci are normal for age. Subcortical and deep cerebral white matter T2 hyperintensities are nonspecific but compatible with mild chronic small vessel ischemic disease. Vascular: Major intracranial vascular flow voids are preserved. Skull and upper cervical spine: Unremarkable bone marrow signal. Sinuses/Orbits: Unremarkable orbits. Small volume right sphenoid sinus fluid. Small left sphenoid sinus which is essentially completely opacified. Clear mastoid air cells. Other: None. MRA HEAD FINDINGS There is mild motion artifact. The visualized distal vertebral arteries are widely patent to the basilar with the left being moderately to strongly dominant. Patent PICA, AICA, and SCA origins are visualized bilaterally. Basilar artery is widely patent. There is a small right posterior communicating artery. PCAs are patent without evidence of significant stenosis. The internal carotid arteries are widely patent from skullbase to carotid termini. ACAs and MCAs are patent without evidence of major branch occlusion or significant stenosis. No intracranial aneurysm is identified. IMPRESSION: 1. No acute intracranial abnormality. 2. Mild chronic small vessel ischemic disease. 3. Unremarkable head MRA. Electronically Signed   By: Logan Bores M.D.   On: 02/26/2017 18:14    Procedures Procedures (including critical care time)  Medications Ordered in ED Medications  carbamazepine (TEGRETOL) tablet 400 mg (400 mg Oral Given 02/26/17 2228)  carbamazepine (TEGRETOL) tablet 200 mg (not administered)  enoxaparin (LOVENOX)  injection 40 mg (40 mg Subcutaneous Given 02/26/17 2235)  sodium chloride flush (NS) 0.9 % injection 3 mL (3 mLs Intravenous Given 02/26/17 2235)  0.9 %  sodium chloride infusion ( Intravenous New Bag/Given 02/26/17 2026)  acetaminophen (TYLENOL) tablet 650 mg (not administered)    Or  acetaminophen (TYLENOL) suppository 650 mg (not administered)  ondansetron (ZOFRAN) tablet 4 mg (not administered)    Or  ondansetron (ZOFRAN) injection 4 mg (not administered)  hydrALAZINE (APRESOLINE) injection 10 mg (not administered)  LORazepam (ATIVAN) injection 1 mg (not administered)  cefTRIAXone (ROCEPHIN) 1 g in dextrose 5 % 50 mL IVPB (1 g Intravenous Given 02/26/17 2231)  guaiFENesin (MUCINEX) 12 hr tablet  600 mg (600 mg Oral Given 02/26/17 2228)  sodium chloride 0.9 % bolus 1,000 mL (0 mLs Intravenous Stopped 02/26/17 1827)     Initial Impression / Assessment and Plan / ED Course  I have reviewed the triage vital signs and the nursing notes.  Pertinent labs & imaging results that were available during my care of the patient were reviewed by me and considered in my medical decision making (see chart for details).     Patient is overall well-appearing. Afebrile and hemodynamically stable. Neuro exam unremarkable as above, and I doubt CVA, though description of patient's gait disturbance and word finding difficulties concerning for TIA. CT head with microvascular ischemic disease without acute intracranial abnormalities. Neurology consultated, and patient will be admitted to medicine for TIA workup.  Patient endorses sore throat and is currently being treated for strep throat. She is status post adenoidectomy and I do not see any exudates in her posterior oropharynx. Also endorses fatigue. Will test for influenza as her daughter recently tested positive for this.   Patient has a new right bundle-branch block on EKG. She denies chest pain or shortness of breath. Troponin is 0.00 and I below suspicion for  cardiac ischemia.  CBC with mild leukocytosis to 13. BMP unremarkable. Patient is admitted to medicine for further workup.  Care of patient overseen by my attending, Dr. Jeanell Sparrow.  Final Clinical Impressions(s) / ED Diagnoses   Final diagnoses:  TIA (transient ischemic attack)  TIA (transient ischemic attack)    New Prescriptions Current Discharge Medication List       Zipporah Plants, MD 02/27/17 [id number]    Pattricia Boss, MD 03/06/17 1718

## 2017-02-27 ENCOUNTER — Observation Stay (HOSPITAL_BASED_OUTPATIENT_CLINIC_OR_DEPARTMENT_OTHER): Payer: PPO

## 2017-02-27 ENCOUNTER — Encounter (HOSPITAL_COMMUNITY): Payer: Self-pay | Admitting: *Deleted

## 2017-02-27 DIAGNOSIS — G459 Transient cerebral ischemic attack, unspecified: Secondary | ICD-10-CM

## 2017-02-27 LAB — ECHOCARDIOGRAM COMPLETE
CHL CUP TV REG PEAK VELOCITY: 341 cm/s
E/e' ratio: 7.18
EWDT: 158 ms
FS: 38 % (ref 28–44)
Height: 65 in
IVS/LV PW RATIO, ED: 1.04
LA diam end sys: 38 mm
LA diam index: 2.02 cm/m2
LASIZE: 38 mm
LAVOL: 43.7 mL
LAVOLA4C: 41.5 mL
LAVOLIN: 23.2 mL/m2
LV e' LATERAL: 12.3 cm/s
LVEEAVG: 7.18
LVEEMED: 7.18
LVOT SV: 85 mL
LVOT VTI: 27.1 cm
LVOT area: 3.14 cm2
LVOT diameter: 20 mm
LVOT peak grad rest: 5 mmHg
LVOT peak vel: 107 cm/s
MV Dec: 158
MVPG: 3 mmHg
MVPKAVEL: 75.2 m/s
MVPKEVEL: 88.3 m/s
PW: 8.4 mm — AB (ref 0.6–1.1)
RV LATERAL S' VELOCITY: 13.5 cm/s
RV TAPSE: 26.1 mm
RV sys press: 50 mmHg
TDI e' lateral: 12.3
TDI e' medial: 7.72
TRMAXVEL: 341 cm/s
WEIGHTICAEL: 2821.89 [oz_av]

## 2017-02-27 LAB — CBC
HEMATOCRIT: 31.3 % — AB (ref 36.0–46.0)
Hemoglobin: 10 g/dL — ABNORMAL LOW (ref 12.0–15.0)
MCH: 28.7 pg (ref 26.0–34.0)
MCHC: 31.9 g/dL (ref 30.0–36.0)
MCV: 89.7 fL (ref 78.0–100.0)
PLATELETS: 165 10*3/uL (ref 150–400)
RBC: 3.49 MIL/uL — AB (ref 3.87–5.11)
RDW: 13.8 % (ref 11.5–15.5)
WBC: 8 10*3/uL (ref 4.0–10.5)

## 2017-02-27 LAB — LIPID PANEL
CHOL/HDL RATIO: 2.7 ratio
Cholesterol: 162 mg/dL (ref 0–200)
HDL: 59 mg/dL (ref >40)
LDL CALC: 85 mg/dL (ref 0–99)
TRIGLYCERIDES: 89 mg/dL (ref <150)
VLDL: 18 mg/dL (ref 0–40)

## 2017-02-27 LAB — VAS US CAROTID
LCCADSYS: -94 cm/s
LEFT ECA DIAS: -17 cm/s
LEFT VERTEBRAL DIAS: 34 cm/s
LICAPDIAS: -34 cm/s
Left CCA dist dias: -30 cm/s
Left CCA prox dias: 23 cm/s
Left CCA prox sys: 85 cm/s
Left ICA dist dias: -55 cm/s
Left ICA dist sys: -148 cm/s
Left ICA prox sys: -96 cm/s
RCCAPDIAS: -37 cm/s
RCCAPSYS: -127 cm/s
RIGHT ECA DIAS: -18 cm/s
RIGHT VERTEBRAL DIAS: -25 cm/s
Right cca dist sys: -128 cm/s

## 2017-02-27 LAB — BASIC METABOLIC PANEL
Anion gap: 8 (ref 5–15)
BUN: 5 mg/dL — AB (ref 6–20)
CO2: 23 mmol/L (ref 22–32)
Calcium: 8 mg/dL — ABNORMAL LOW (ref 8.9–10.3)
Chloride: 109 mmol/L (ref 101–111)
Creatinine, Ser: 0.66 mg/dL (ref 0.44–1.00)
Glucose, Bld: 100 mg/dL — ABNORMAL HIGH (ref 65–99)
POTASSIUM: 3 mmol/L — AB (ref 3.5–5.1)
SODIUM: 140 mmol/L (ref 135–145)

## 2017-02-27 MED ORDER — CARBAMAZEPINE 200 MG PO TABS: 400.0000 mg | ORAL_TABLET | Freq: Every day | ORAL | 0 refills | Status: DC

## 2017-02-27 MED ORDER — ASPIRIN 81 MG PO TBEC: 81.0000 mg | DELAYED_RELEASE_TABLET | Freq: Every day | ORAL | 0 refills | Status: DC

## 2017-02-27 MED ORDER — POTASSIUM CHLORIDE 20 MEQ PO PACK
20.0000 meq | PACK | Freq: Three times a day (TID) | ORAL | Status: DC
  Filled 2017-02-27: qty 1

## 2017-02-27 MED ORDER — ATORVASTATIN CALCIUM 10 MG PO TABS: 10.0000 mg | ORAL_TABLET | Freq: Every day | ORAL | Status: DC

## 2017-02-27 MED ORDER — ATORVASTATIN CALCIUM 10 MG PO TABS: 10.0000 mg | ORAL_TABLET | Freq: Every day | ORAL | 0 refills | Status: DC

## 2017-02-27 MED ORDER — ASPIRIN EC 81 MG PO TBEC
81.0000 mg | DELAYED_RELEASE_TABLET | Freq: Every day | ORAL | Status: DC
  Administered 2017-02-27: 81 mg via ORAL
  Filled 2017-02-27: qty 1

## 2017-02-27 MED ORDER — POTASSIUM CHLORIDE CRYS ER 20 MEQ PO TBCR
40.0000 meq | EXTENDED_RELEASE_TABLET | Freq: Once | ORAL | Status: AC
  Administered 2017-02-27: 40 meq via ORAL

## 2017-02-27 MED ORDER — TEGRETOL 200 MG PO TABS: ORAL_TABLET | ORAL | 0 refills | Status: DC

## 2017-02-27 NOTE — Discharge Instructions (Addendum)
Carotid Artery Disease The carotid arteries are arteries on both sides of the neck. They carry blood to the brain. Carotid artery disease is when the arteries get smaller (narrow) or get blocked. If these arteries get smaller or get blocked, you are more likely to have a stroke or warning stroke (transient ischemic attack). Follow these instructions at home:  Take medicines as told by your doctor. Make sure you understand all your medicine instructions. Do not stop your medicines without talking to your doctor first.  Follow your doctor's diet instructions. It is important to eat a healthy diet that includes plenty of:  Fresh fruits.  Vegetables.  Lean meats.  Avoid:  High-fat foods.  High-sodium foods.  Foods that are fried, overly processed, or have poor nutritional value.  Stay a healthy weight.  Stay active. Get at least 30 minutes of activity every day.  Do not smoke.  Limit alcohol use to:  No more than 2 drinks a day for men.  No more than 1 drink a day for women who are not pregnant.  Do not use illegal drugs.  Keep all doctor visits as told. Get help right away if:  You have sudden weakness or loss of feeling (numbness) on one side of the body, such as the face, arm, or leg.  You have sudden confusion.  You have trouble speaking (aphasia) or understanding.  You have sudden trouble seeing out of one or both eyes.  You have sudden trouble walking.  You have dizziness or feel like you might pass out (faint).  You have a loss of balance or your movements are not steady (uncoordinated).  You have a sudden, severe headache with no known cause.  You have trouble swallowing (dysphagia). Call your local emergency services (911 in [place] Do notdrive yourself to the clinic or hospital. This information is not intended to replace advice given to you by your health care provider. Make sure you discuss any questions you have with your health care  provider. Document Released: 11/02/2012 Document Revised: 04/23/2016 Document Reviewed: 05/17/2013 Elsevier Interactive Patient Education  2017 [employer].   CPAP and BiPAP Information CPAP and BiPAP are methods of helping a person breathe with the use of air pressure. CPAP stands for "continuous positive airway pressure." BiPAP stands for "bi-level positive airway pressure." In both methods, air is blown through your nose or mouth and into your air passages to help you breathe well. CPAP and BiPAP use different amounts of pressure to blow air. With CPAP, the amount of pressure stays the same while you breathe in and out. With BiPAP, the amount of pressure is increased when you breathe in (inhale) so that you can take larger breaths. Your health care provider will recommend whether CPAP or BiPAP would be more helpful for you. Why are CPAP and BiPAP treatments used? CPAP or BiPAP can be helpful if you have:  Sleep apnea.  Chronic obstructive pulmonary disease (COPD).  Heart failure.  Medical conditions that weaken the muscles of the chest including muscular dystrophy, or neurological diseases such as amyotrophic lateral sclerosis (ALS).  Other problems that cause breathing to be weak, abnormal, or difficult. CPAP is most commonly used for obstructive sleep apnea (OSA) to keep the airways from collapsing when the muscles relax during sleep. How is CPAP or BiPAP administered? Both CPAP and BiPAP are provided by a small machine with a flexible plastic tube that attaches to a plastic mask. You wear the mask. Air is blown through  the mask into your nose or mouth. The amount of pressure that is used to blow the air can be adjusted on the machine. Your health care provider will determine the pressure setting that should be used based on your individual needs. When should CPAP or BiPAP be used? In most cases, the mask only needs to be worn during sleep. Generally, the mask needs to be worn  throughout the night and during any daytime naps. People with certain medical conditions may also need to wear the mask at other times when they are awake. Follow instructions from your health care provider about when to use the machine. What are some tips for using the mask?  Because the mask needs to be snug, some people feel trapped or closed-in (claustrophobic) when first using the mask. If you feel this way, you may need to get used to the mask. One way to do this is by holding the mask loosely over your nose or mouth and then gradually applying the mask more snugly. You can also gradually increase the amount of time that you use the mask.  Masks are available in various types and sizes. Some fit over your mouth and nose while others fit over just your nose. If your mask does not fit well, talk with your health care provider about getting a different one.  If you are using a mask that fits over your nose and you tend to breathe through your mouth, a chin strap may be applied to help keep your mouth closed.  The CPAP and BiPAP machines have alarms that may sound if the mask comes off or develops a leak.  If you have trouble with the mask, it is very important that you talk with your health care provider about finding a way to make the mask easier to tolerate. Do not stop using the mask. Stopping the use of the mask could have a negative impact on your health. What are some tips for using the machine?  Place your CPAP or BiPAP machine on a secure table or stand near an electrical outlet.  Know where the on/off switch is located on the machine.  Follow instructions from your health care provider about how to set the pressure on your machine and when you should use it.  Do not eat or drink while the CPAP or BiPAP machine is on. Food or fluids could get pushed into your lungs by the pressure of the CPAP or BiPAP.  Do not smoke. Tobacco smoke residue can damage the machine.  For home use, CPAP  and BiPAP machines can be rented or purchased through home health care companies. Many different brands of machines are available. Renting a machine before purchasing may help you find out which particular machine works well for you.  Keep the CPAP or BiPAP machine and attachments clean. Ask your health care provider for specific instructions. Get help right away if:  You have redness or open areas around your nose or mouth where the mask fits.  You have trouble using the CPAP or BiPAP machine.  You cannot tolerate wearing the CPAP or BiPAP mask.  You have pain, discomfort, and bloating in your abdomen. Summary  CPAP and BiPAP are methods of helping a person breathe with the use of air pressure.  Both CPAP and BiPAP are provided by a small machine with a flexible plastic tube that attaches to a plastic mask.  If you have trouble with the mask, it is very important that you  talk with your health care provider about finding a way to make the mask easier to tolerate. This information is not intended to replace advice given to you by your health care provider. Make sure you discuss any questions you have with your health care provider. Document Released: 08/14/2004 Document Revised: 10/05/2016 Document Reviewed: 10/05/2016 Elsevier Interactive Patient Education  2017 [employer]   Aspirin and Your Heart Aspirin is a medicine that affects the way blood clots. Aspirin can be used to help reduce the risk of blood clots, heart attacks, and other heart-related problems. Should I take aspirin? Your health care provider will help you determine whether it is safe and beneficial for you to take aspirin daily. Taking aspirin daily may be beneficial if you:  Have had a heart attack or chest pain.  Have undergone open heart surgery such as coronary artery bypass surgery (CABG).  Have had coronary angioplasty.  Have experienced a stroke or transient ischemic attack (TIA).  Have peripheral  vascular disease (PVD).  Have chronic heart rhythm problems such as atrial fibrillation. Are there any risks of taking aspirin daily? Daily use of aspirin can increase your risk of side effects. Some of these include:  Bleeding. Bleeding problems can be minor or serious. An example of a minor problem is a cut that does not stop bleeding. An example of a more serious problem is stomach bleeding or bleeding into the brain. Your risk of bleeding is increased if you are also taking non-steroidal anti-inflammatory medicine (NSAIDs).  Increased bruising.  Upset stomach.  An allergic reaction. People who have nasal polyps have an increased risk of developing an aspirin allergy. What are some guidelines I should follow when taking aspirin?  Take aspirin only as directed by your health care provider. Make sure you understand how much you should take and what form you should take. The two forms of aspirin are:  Non-enteric-coated. This type of aspirin does not have a coating and is absorbed quickly. Non-enteric-coated aspirin is usually recommended for people with chest pain. This type of aspirin also comes in a chewable form.  Enteric-coated. This type of aspirin has a special coating that releases the medicine very slowly. Enteric-coated aspirin causes less stomach upset than non-enteric-coated aspirin. This type of aspirin should not be chewed or crushed.  Drink alcohol in moderation. Drinking alcohol increases your risk of bleeding. When should I seek medical care?  You have unusual bleeding or bruising.  You have stomach pain.  You have an allergic reaction. Symptoms of an allergic reaction include:  Hives.  Itchy skin.  Swelling of the lips, tongue, or face.  You have ringing in your ears. When should I seek immediate medical care?  Your bowel movements are bloody, dark red, or black in color.  You vomit or cough up blood.  You have blood in your urine.  You cough, wheeze, or  feel short of breath. If you have any of the following symptoms, this is an emergency. Do not wait to see if the pain will go away. Get medical help at once. Call your local emergency services (911 in the [place] Do not drive yourself to the hospital.  You have severe chest pain, especially if the pain is crushing or pressure-like and spreads to the arms, back, neck, or jaw.  You have stroke-like symptoms, such as:  Loss of vision.  Difficulty talking.  Numbness or weakness on one side of your body.  Numbness or weakness in your arm or leg.  Not thinking clearly or feeling confused. This information is not intended to replace advice given to you by your health care provider. Make sure you discuss any questions you have with your health care provider. Document Released: 10/29/2008 Document Revised: 03/25/2016 Document Reviewed: 02/21/2014 Elsevier Interactive Patient Education  2017 [employer] Atorvastatin tablets What is this medicine? ATORVASTATIN (a TORE va sta tin) is known as a HMG-CoA reductase inhibitor or 'statin'. It lowers the level of cholesterol and triglycerides in the blood. This drug may also reduce the risk of heart attack, stroke, or other health problems in patients with risk factors for heart disease. Diet and lifestyle changes are often used with this drug. This medicine may be used for other purposes; ask your health care provider or pharmacist if you have questions. COMMON BRAND NAME(S): Lipitor What should I tell my health care provider before I take this medicine? They need to know if you have any of these conditions: -frequently drink alcoholic beverages -history of stroke, TIA -kidney disease -liver disease -muscle aches or weakness -other medical condition -an unusual or allergic reaction to atorvastatin, other medicines, foods, dyes, or preservatives -pregnant or trying to get pregnant -breast-feeding How should I use this medicine? Take this medicine  by mouth with a glass of water. Follow the directions on the prescription label. You can take this medicine with or without food. Take your doses at regular intervals. Do not take your medicine more often than directed. Talk to your pediatrician regarding the use of this medicine in children. While this drug may be prescribed for children as young as 62 years old for selected conditions, precautions do apply. Overdosage: If you think you have taken too much of this medicine contact a poison control center or emergency room at once. NOTE: This medicine is only for you. Do not share this medicine with others. What if I miss a dose? If you miss a dose, take it as soon as you can. If it is almost time for your next dose, take only that dose. Do not take double or extra doses. What may interact with this medicine? Do not take this medicine with any of the following medications: -red yeast rice -telaprevir -telithromycin -voriconazole This medicine may also interact with the following medications: -alcohol -antiviral medicines for HIV or AIDS -boceprevir -certain antibiotics like clarithromycin, erythromycin, troleandomycin -certain medicines for cholesterol like fenofibrate or gemfibrozil -cimetidine -clarithromycin -colchicine -cyclosporine -digoxin -female hormones, like estrogens or progestins and birth control pills -grapefruit juice -medicines for fungal infections like fluconazole, itraconazole, ketoconazole -niacin -rifampin -spironolactone This list may not describe all possible interactions. Give your health care provider a list of all the medicines, herbs, non-prescription drugs, or dietary supplements you use. Also tell them if you smoke, drink alcohol, or use illegal drugs. Some items may interact with your medicine. What should I watch for while using this medicine? Visit your doctor or health care professional for regular check-ups. You may need regular tests to make sure your  liver is working properly. Tell your doctor or health care professional right away if you get any unexplained muscle pain, tenderness, or weakness, especially if you also have a fever and tiredness. Your doctor or health care professional may tell you to stop taking this medicine if you develop muscle problems. If your muscle problems do not go away after stopping this medicine, contact your health care professional. This drug is only part of a total heart-health program. Your doctor or a dietician can suggest a  low-cholesterol and low-fat diet to help. Avoid alcohol and smoking, and keep a proper exercise schedule. Do not use this drug if you are pregnant or breast-feeding. Serious side effects to an unborn child or to an infant are possible. Talk to your doctor or pharmacist for more information. This medicine may affect blood sugar levels. If you have diabetes, check with your doctor or health care professional before you change your diet or the dose of your diabetic medicine. If you are going to have surgery tell your health care professional that you are taking this drug. What side effects may I notice from receiving this medicine? Side effects that you should report to your doctor or health care professional as soon as possible: -allergic reactions like skin rash, itching or hives, swelling of the face, lips, or tongue -dark urine -fever -joint pain -muscle cramps, pain -redness, blistering, peeling or loosening of the skin, including inside the mouth -trouble passing urine or change in the amount of urine -unusually weak or tired -yellowing of eyes or skin Side effects that usually do not require medical attention (report to your doctor or health care professional if they continue or are bothersome): -constipation -heartburn -stomach gas, pain, upset This list may not describe all possible side effects. Call your doctor for medical advice about side effects. You may report side effects to  FDA at 1-800-FDA-1088. Where should I keep my medicine? Keep out of the reach of children. Store at room temperature between 20 to 25 degrees C (68 to 77 degrees F). Throw away any unused medicine after the expiration date. NOTE: This sheet is a summary. It may not cover all possible information. If you have questions about this medicine, talk to your doctor, pharmacist, or health care provider.  2018 Elsevier/Gold Standard (2011-10-06 65:53:74)

## 2017-02-27 NOTE — Progress Notes (Signed)
VASCULAR LAB PRELIMINARY  PRELIMINARY  PRELIMINARY  PRELIMINARY  Carotid duplex completed.    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  Vertebral artery flow is antegrade.   Unika Nazareno, RVT 02/27/2017, 10:00 AM

## 2017-02-27 NOTE — Discharge Summary (Signed)
Physician Discharge Summary  Laura Stuart AST:[id number] DOB: August 22, 1946 DOA: 02/26/2017  PCP: [name], MD  Admit date: 02/26/2017 Discharge date: 02/27/2017  Recommendations for Outpatient Follow-up:  ECHO showed normal ejection fraction. Carotid doppler is unremarkable. Continue aspirin, Lipitor. Follow up with PCP in 2 weeks on discharge.  Discharge Diagnoses:  Active Problems:   TIA (transient ischemic attack)    Discharge Condition: stable   Diet recommendation: as tolerated   History of present illness:   Per HPI "71 y.o. female  with past history of trigeminal neuralgia, sleep apnea, allergies presents with abnormal movements or weakness. Patient states she was in normal state of health until less than 5 days ago to a road trip with her family. She developed a sore throat and was seen in urgent care. She was diagnosed with strep and started on antibiotics. The day she returned home she rode in a car with a family member who was diagnosed with flu for 13 hours. The following morning she was extremely weak and could not walk. She also had trouble talking. Family brought her to the emergency room for evaluation. No history of strokes. Denies history of head trauma. No history of concussion. ED course: Flu swab ordered. Patient given IV fluids. CT negative for acute stroke. Hospitalist consultation for admission."   Hospital Course:   Active Problems:   TIA (transient ischemic attack)  - CT head negative - MRI / MRA brain - no acute stroke seen. Mild chronic small vessel ischemic changes are noted. Patent circle of Willis. Given no stroke on MRI, TIA is on DDx. Also on DDx is confusion as side effect of penicillin or secondary to intercurrent illness - Continue aspirin and Lipitor on discharge as prescribed - Pt ambulating fine without assistance, decline PT eval - Stable for discharge home, may resume other home meds as per prior to the admission     Signed:  Leisa Lenz, MD  [facility] 02/27/2017, 11:28 AM  Pager #: [phone]  Time spent in minutes: less than 30 minutes  Procedures:  ECHO - normal EF  Carotid doppler - 40-59% ICA stenosis, bilaterally.  Vertebral artery flow is antegrade.   Consultations:  Neurology   Discharge Exam: Vitals:   02/27/17 0601 02/27/17 1040  BP: 121/62 (!) 112/57  Pulse: 72 71  Resp: 18 16  Temp:  97.8 F (36.6 C)   Vitals:   02/27/17 0400 02/27/17 0550 02/27/17 0601 02/27/17 1040  BP: (!) 110/57  121/62 (!) 112/57  Pulse: 75  72 71  Resp:   18 16  Temp:    97.8 F (36.6 C)  TempSrc:    Oral  SpO2: 95% (!) 85% 99% 99%  Weight:      Height:        General: Pt is alert, follows commands appropriately, not in acute distress Cardiovascular: Regular rate and rhythm, S1/S2 +, no murmurs Respiratory: Clear to auscultation bilaterally, no wheezing, no crackles, no rhonchi Abdominal: Soft, non tender, non distended, bowel sounds +, no guarding Extremities: no edema, no cyanosis, pulses palpable bilaterally DP and PT Neuro: Grossly nonfocal  Discharge Instructions  Discharge Instructions    Call MD for:  persistant nausea and vomiting    Complete by:  As directed    Call MD for:  redness, tenderness, or signs of infection (pain, swelling, redness, odor or green/yellow discharge around incision site)    Complete by:  As directed    Call MD for:  severe uncontrolled pain  Complete by:  As directed    Diet - low sodium heart healthy    Complete by:  As directed    Discharge instructions    Complete by:  As directed    ECHO showed normal ejection fraction. Carotid doppler is unremarkable. Continue aspirin, Lipitor. Follow up with PCP in 2 weeks on discharge.   Increase activity slowly    Complete by:  As directed      Allergies as of 02/27/2017      Reactions   Penicillins Other (See Comments)   From allergy testing, unsure if allergy exists       Medication List    TAKE these medications   aspirin 81 MG EC tablet Take 1 tablet (81 mg total) by mouth daily.   atorvastatin 10 MG tablet Commonly known as:  LIPITOR Take 1 tablet (10 mg total) by mouth daily at 6 PM.   CALCIUM CARBONATE-VITAMIN D3 PO Take 1 tablet by mouth daily.   carbamazepine 200 MG tablet Commonly known as:  TEGRETOL Take 2 tablets (400 mg total) by mouth at bedtime. What changed:  how much to take  how to take this  when to take this  additional instructions   TEGRETOL 200 MG tablet Generic drug:  carbamazepine Take 200 in AM and 200 mg at lunch time, separate prescription provided for 400 mg at bedtime What changed:  You were already taking a medication with the same name, and this prescription was added. Make sure you understand how and when to take each.   HALOG 0.1 % Crea Generic drug:  Halcinonide Apply 1 application topically daily as needed (sparingly for itching).   zolpidem 10 MG tablet Commonly known as:  AMBIEN Take 1 tablet (10 mg total) by mouth at bedtime as needed for sleep.      Follow-up Information    [name], MD. Schedule an appointment as soon as possible for a visit in 2 week(s).   Specialty:  Family Medicine Contact information: [address] [place] [address] [phone]            The results of significant diagnostics from this hospitalization (including imaging, microbiology, ancillary and laboratory) are listed below for reference.    Significant Diagnostic Studies: Ct Head Wo Contrast  Result Date: 02/26/2017 CLINICAL DATA:  Sudden onset of imbalance. EXAM: CT HEAD WITHOUT CONTRAST TECHNIQUE: Contiguous axial images were obtained from the base of the skull through the vertex without intravenous contrast. COMPARISON:  None. FINDINGS: Brain: Scattered periventricular hypodensities, most conspicuous about the anterior horn of left lateral ventricle. Gray-white differentiation is  otherwise well maintained. No CT evidence of acute large territory infarct. No intraparenchymal or extra-axial mass or hemorrhage. Normal size and configuration of the ventricles and the basilar cisterns. No midline shift. Vascular: No hyperdense vessel or unexpected calcification. Skull: No displaced calvarial fracture. Sinuses/Orbits: Small air-fluid level within the sphenoid sinus. The remaining paranasal sinuses and mastoid air cells are normally aerated. Other: Regional soft tissues appear normal. IMPRESSION: 1. 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 2. Small air-fluid level within the sphenoid sinus as could be seen in the setting of acute sinusitis. Electronically Signed   By: Sandi Mariscal M.D.   On: 02/26/2017 14:45   Mr Laura Stuart Head Wo Contrast  Result Date: 02/26/2017 CLINICAL DATA:  Transient gait instability. EXAM: MRI HEAD WITHOUT CONTRAST MRA HEAD WITHOUT CONTRAST TECHNIQUE: Multiplanar, multiecho pulse sequences of the brain and surrounding structures were obtained without intravenous  contrast. Angiographic images of the head were obtained using MRA technique without contrast. COMPARISON:  Head CT 02/26/2017 FINDINGS: MRI HEAD FINDINGS Brain: There is no evidence of acute infarct, intracranial hemorrhage, mass, midline shift, or extra-axial fluid collection. The ventricles and sulci are normal for age. Subcortical and deep cerebral white matter T2 hyperintensities are nonspecific but compatible with mild chronic small vessel ischemic disease. Vascular: Major intracranial vascular flow voids are preserved. Skull and upper cervical spine: Unremarkable bone marrow signal. Sinuses/Orbits: Unremarkable orbits. Small volume right sphenoid sinus fluid. Small left sphenoid sinus which is essentially completely opacified. Clear mastoid air cells. Other: None. MRA HEAD FINDINGS There is mild motion artifact. The visualized distal vertebral arteries are widely patent to the basilar  with the left being moderately to strongly dominant. Patent PICA, AICA, and SCA origins are visualized bilaterally. Basilar artery is widely patent. There is a small right posterior communicating artery. PCAs are patent without evidence of significant stenosis. The internal carotid arteries are widely patent from skullbase to carotid termini. ACAs and MCAs are patent without evidence of major branch occlusion or significant stenosis. No intracranial aneurysm is identified. IMPRESSION: 1. No acute intracranial abnormality. 2. Mild chronic small vessel ischemic disease. 3. Unremarkable head MRA. Electronically Signed   By: Logan Bores M.D.   On: 02/26/2017 18:14   Mr Brain Wo Contrast  Result Date: 02/26/2017 CLINICAL DATA:  Transient gait instability. EXAM: MRI HEAD WITHOUT CONTRAST MRA HEAD WITHOUT CONTRAST TECHNIQUE: Multiplanar, multiecho pulse sequences of the brain and surrounding structures were obtained without intravenous contrast. Angiographic images of the head were obtained using MRA technique without contrast. COMPARISON:  Head CT 02/26/2017 FINDINGS: MRI HEAD FINDINGS Brain: There is no evidence of acute infarct, intracranial hemorrhage, mass, midline shift, or extra-axial fluid collection. The ventricles and sulci are normal for age. Subcortical and deep cerebral white matter T2 hyperintensities are nonspecific but compatible with mild chronic small vessel ischemic disease. Vascular: Major intracranial vascular flow voids are preserved. Skull and upper cervical spine: Unremarkable bone marrow signal. Sinuses/Orbits: Unremarkable orbits. Small volume right sphenoid sinus fluid. Small left sphenoid sinus which is essentially completely opacified. Clear mastoid air cells. Other: None. MRA HEAD FINDINGS There is mild motion artifact. The visualized distal vertebral arteries are widely patent to the basilar with the left being moderately to strongly dominant. Patent PICA, AICA, and SCA origins are  visualized bilaterally. Basilar artery is widely patent. There is a small right posterior communicating artery. PCAs are patent without evidence of significant stenosis. The internal carotid arteries are widely patent from skullbase to carotid termini. ACAs and MCAs are patent without evidence of major branch occlusion or significant stenosis. No intracranial aneurysm is identified. IMPRESSION: 1. No acute intracranial abnormality. 2. Mild chronic small vessel ischemic disease. 3. Unremarkable head MRA. Electronically Signed   By: Logan Bores M.D.   On: 02/26/2017 18:14    Microbiology: No results found for this or any previous visit (from the past 240 hour(s)).   Labs: Basic Metabolic Panel:  Recent Labs Lab 02/26/17 1450 02/26/17 1514 02/26/17 1621 02/27/17 0514  NA 139 139  --  140  K 3.6 3.6  --  3.0*  CL 107 107  --  109  CO2 25  --   --  23  GLUCOSE 93 93  --  100*  BUN 11 12  --  5*  CREATININE 0.77 0.70 0.76 0.66  CALCIUM 8.7*  --   --  8.0*   Liver Function  Tests:  Recent Labs Lab 02/26/17 1450  AST 63*  ALT 78*  ALKPHOS 76  BILITOT 0.3  PROT 6.0*  ALBUMIN 3.2*   No results for input(s): LIPASE, AMYLASE in the last 168 hours. No results for input(s): AMMONIA in the last 168 hours. CBC:  Recent Labs Lab 02/26/17 1450 02/26/17 1514 02/27/17 0514  WBC 13.0*  --  8.0  NEUTROABS 10.3*  --   --   HGB 11.3* 10.9* 10.0*  HCT 35.2* 32.0* 31.3*  MCV 89.6  --  89.7  PLT 184  --  165   Cardiac Enzymes: No results for input(s): CKTOTAL, CKMB, CKMBINDEX, TROPONINI in the last 168 hours. BNP: BNP (last 3 results) No results for input(s): BNP in the last 8760 hours.  ProBNP (last 3 results) No results for input(s): PROBNP in the last 8760 hours.  CBG:  Recent Labs Lab 02/26/17 1455  GLUCAP 82

## 2017-02-27 NOTE — Progress Notes (Signed)
  Echocardiogram 2D Echocardiogram has been performed.  Laura Stuart 02/27/2017, 9:48 AM

## 2017-02-27 NOTE — Progress Notes (Signed)
Patient refuses CPAP therapy for tonight.  Patient wears nasal pillows at home and did not think she would tolerate a nasal mask.  Patient instructed to notify RN if she decided to wear.

## 2017-02-27 NOTE — Progress Notes (Signed)
STROKE TEAM PROGRESS NOTE   HISTORY OF PRESENT ILLNESS (per record) Laura Stuart is an 71 y.o. female who recently visited her daughter in [place]. This morning patient was feeling okay however she was diagnosed with strep throat 2 days ago and was placed on penicillin. She had not been drinking much water secondary to the throat soreness however she was drinking some water. Patient got up this morning and was packing the car. She states that she made multiple trips and felt fine. At about 11:45 when her husband arrived he noted that her gait was very unstable when she stood up. He described her as walking like a drunk person. Patient states that she did feel dizzy however she did not have any vertiginous symptoms. She has felt dizzy when standing before such as presyncope but denies that this was the feeling that she was having at this time. She stated" I felt like I was trying to get my legs to move in certain directions but they just wouldn't go away and wanted them to". Patient has never had symptoms like this before. Symptoms lasted for approximately 30-40 minutes. Patient denies any ataxic speech, dysarthria, blurred vision, diplopia, numbness or tingling, weakness in upper extremities or lower extremities, or difficulty swallowing. At the time that she arrived to the hospital today she was feeling better and she no longer has these symptoms.  Of note she states that she's been told she is allergic to penicillin in the past but has no idea what that meant. She has had penicillin in the past. She is also had had the penicillin since the event and has had no problems.  Date last known well: Date: 02/26/2017 Time last known well: Time: 11:45 tPA Given: No: symptoms resolved   SUBJECTIVE (INTERVAL HISTORY) No family is at the bedside.  .She has no complaints. She wants to go home   OBJECTIVE Temp:  [98.8 F (37.1 C)-99.9 F (37.7 C)] 99.9 F (37.7 C) (03/30 1847) Pulse Rate:   [72-88] 72 (03/31 0601) Cardiac Rhythm: Normal sinus rhythm (03/30 2029) Resp:  [14-20] 18 (03/31 0601) BP: (110-128)/(49-65) 121/62 (03/31 0601) SpO2:  [85 %-99 %] 99 % (03/31 0601) Weight:  [80 kg (176 lb 5.9 oz)] 80 kg (176 lb 5.9 oz) (03/30 2000)  CBC:   Recent Labs Lab 02/26/17 1450 02/26/17 1514 02/27/17 0514  WBC 13.0*  --  8.0  NEUTROABS 10.3*  --   --   HGB 11.3* 10.9* 10.0*  HCT 35.2* 32.0* 31.3*  MCV 89.6  --  89.7  PLT 184  --  017    Basic Metabolic Panel:   Recent Labs Lab 02/26/17 1450 02/26/17 1514 02/26/17 1621 02/27/17 0514  NA 139 139  --  140  K 3.6 3.6  --  3.0*  CL 107 107  --  109  CO2 25  --   --  23  GLUCOSE 93 93  --  100*  BUN 11 12  --  5*  CREATININE 0.77 0.70 0.76 0.66  CALCIUM 8.7*  --   --  8.0*    Lipid Panel:     Component Value Date/Time   CHOL 162 02/27/2017 0514   TRIG 89 02/27/2017 0514   HDL 59 02/27/2017 0514   CHOLHDL 2.7 02/27/2017 0514   VLDL 18 02/27/2017 0514   LDLCALC 85 02/27/2017 0514   HgbA1c: No results found for: HGBA1C Urine Drug Screen: No results found for: LABOPIA, [place], LABBENZ, AMPHETMU, THCU, LABBARB  IMAGING  Ct Head Wo Contrast 02/26/2017 1. 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  2. Small air-fluid level within the sphenoid sinus as could be seen in the setting of acute sinusitis.    Mr Jodene Nam Head Wo Contrast 02/26/2017 1. No acute intracranial abnormality.  2. Mild chronic small vessel ischemic disease.  3. Unremarkable head MRA.     PHYSICAL EXAM Pleasant elderly Caucasian lady currently not in distress. . Afebrile. Head is nontraumatic. Neck is supple without bruit.    Cardiac exam no murmur or gallop. Lungs are clear to auscultation. Distal pulses are well felt.  Neurological Exam ;  Awake  Alert oriented x 3. Normal speech and language.eye movements full without nystagmus.fundi were not visualized. Vision acuity and fields appear normal. Hearing  is normal. Palatal movements are normal. Face symmetric. Tongue midline. Normal strength, tone, reflexes and coordination. Normal sensation. Gait deferred.      ASSESSMENT/PLAN Ms. Laura Stuart is a 71 y.o. female with history of trigeminal neuralgia, obstructive sleep apnea on C pap, and recent strep throat presenting with transient unstable gait and dizziness. She did not receive IV t-PA due to resolution of deficits.  Possible TIA:   Resultant  No deficits  MRI - No acute intracranial abnormality.   MRA - Unremarkable head MRA.  Carotid Doppler - 40-59% ICA stenosis, bilaterally.  Vertebral artery flow is antegrade.   2D Echo - EF 55-60%. No cardiac source of emboli identified.  LDL - 85  HgbA1c pending  VTE prophylaxis - none Diet regular Room service appropriate? Yes; Fluid consistency: Thin  No antithrombotic prior to admission, now on aspirin 81 mg daily  Patient counseled to be compliant with her antithrombotic medications  Ongoing aggressive stroke risk factor management  Therapy recommendations: pending  Disposition: Pending  Hypertension  Blood pressure tends to run mildly low.  Permissive hypertension (OK if < 220/120) but gradually normalize in 5-7 days  Long-term BP goal normotensive  Hyperlipidemia  Home meds: No lipid lowering medication prior to admission.  LDL 85, goal < 70  Now on Lipitor 10 mg daily  Continue statin at discharge   Other Stroke Risk Factors  ETOH use, advised to drink no more than 1 drink per day.  Obesity, Body mass index is 29.35 kg/m., recommend weight loss, diet and exercise as appropriate   Family hx stroke (mother)  Obstructive sleep apnea, on CPAP at home  Other Active Problems  Hypokalemia - K 3.0 - supplement  Anemia - 10 / 31.3  Strep throat - IV Rocephin  Hospital day # 0  I have personally examined this patient, reviewed notes, independently viewed imaging studies, participated in  medical decision making and plan of care.ROS completed by me personally and pertinent positives fully documented  I have made any additions or clarifications directly to the above note. Presented with transient gait ataxia possibly posterior circulation TIA. Neuroimaging and examination is normal. She may be discharged home on Doppler studies and echocardiogram. Follow-up as an outpatient in stroke clinic in 6 weeks. Greater than 50% time during this 25 minute visit was spent on counseling and coordination of care about TIA and stroke prevention and discussion of treatment options and answering questions.  Antony Contras, MD Medical Director [facility] Pager: [phone] 02/27/2017 3:46 PM   To contact Stroke Continuity provider, please refer to [email or web address] After hours, contact General Neurology

## 2017-02-27 NOTE — Progress Notes (Signed)
Discharge instructions reviewed with patient and husband.  These included the following:  Diagnosis and follow-up appointments, recent testing, teaching materials on atherosclerosis and medications to treat, medication schedule, prescriptions, when to call the MD, and diet and activity level.  Comprehension of information validated utilizing "teach-back" technique.  Appropriate TIA teaching completed.  Patient discharged to home with husband via private vehicle.  Escorted to exit via wheelchair by nurse tech.

## 2017-02-28 LAB — HEMOGLOBIN A1C
HEMOGLOBIN A1C: 5.5 % (ref 4.8–5.6)
Mean Plasma Glucose: 111 mg/dL

## 2017-03-01 ENCOUNTER — Encounter: Payer: Self-pay | Admitting: Physician Assistant

## 2017-03-01 ENCOUNTER — Ambulatory Visit (INDEPENDENT_AMBULATORY_CARE_PROVIDER_SITE_OTHER): Payer: PPO | Admitting: Physician Assistant

## 2017-03-01 ENCOUNTER — Telehealth: Payer: Self-pay

## 2017-03-01 ENCOUNTER — Telehealth: Payer: Self-pay | Admitting: Family Medicine

## 2017-03-01 VITALS — BP 118/70 | HR 75 | Temp 98.9°F | Resp 14 | Ht 65.0 in | Wt 168.0 lb

## 2017-03-01 DIAGNOSIS — J069 Acute upper respiratory infection, unspecified: Secondary | ICD-10-CM | POA: Diagnosis not present

## 2017-03-01 DIAGNOSIS — J020 Streptococcal pharyngitis: Secondary | ICD-10-CM

## 2017-03-01 DIAGNOSIS — B9789 Other viral agents as the cause of diseases classified elsewhere: Secondary | ICD-10-CM | POA: Diagnosis not present

## 2017-03-01 DIAGNOSIS — G459 Transient cerebral ischemic attack, unspecified: Secondary | ICD-10-CM

## 2017-03-01 MED ORDER — FLUTICASONE PROPIONATE 50 MCG/ACT NA SUSP: 2.0000 | Freq: Every day | NASAL | 0 refills | Status: DC

## 2017-03-01 MED ORDER — BENZONATATE 100 MG PO CAPS: 100.0000 mg | ORAL_CAPSULE | Freq: Two times a day (BID) | ORAL | 0 refills | Status: DC | PRN

## 2017-03-01 NOTE — Progress Notes (Signed)
Patient presents to clinic today c/o chest congestion with cough and fatigue starting on Saturday. Also notes some nasal congestion. Patient recently with significant sore throat, currently on treatment for strep throat with Penicillin. Diagnosed at an Urgent Care in [place]. Is tolerating medication without side effect. Notes sore throat is resolved. Wednesday morning will be last pill as she was unable to take as directed as she was in the ER/hospital. Diagnosed with TIA. Has follow-up scheduled with PCP and Neurology. Is on stating and ASA daily. Denies any recurrence of symptoms. Was given Rocephin in the hospital due to her infection.  Past Medical History:  Diagnosis Date  . Allergy   . Atypical facial pain   . Neuralgia    Trigeminal  . Sleep apnea    using cpap,OSA  . Trigeminal neuralgia     Current Outpatient Prescriptions on File Prior to Visit  Medication Sig Dispense Refill  . aspirin EC 81 MG EC tablet Take 1 tablet (81 mg total) by mouth daily. 30 tablet 0  . Calcium Carb-Cholecalciferol (CALCIUM CARBONATE-VITAMIN D3 PO) Take 1 tablet by mouth daily.     . Halcinonide (HALOG) 0.1 % CREA Apply 1 application topically daily as needed (sparingly for itching).     . TEGRETOL 200 MG tablet Take 200 in AM and 200 mg at lunch time, separate prescription provided for 400 mg at bedtime 60 tablet 0  . atorvastatin (LIPITOR) 10 MG tablet Take 1 tablet (10 mg total) by mouth daily at 6 PM. (Patient not taking: Reported on 03/01/2017) 30 tablet 0  . zolpidem (AMBIEN) 10 MG tablet Take 1 tablet (10 mg total) by mouth at bedtime as needed for sleep. (Patient not taking: Reported on 03/01/2017) 30 tablet 5   No current facility-administered medications on file prior to visit.     Allergies  Allergen Reactions  . Penicillins Other (See Comments)    From allergy testing, unsure if allergy exists    Family History  Problem Relation Age of Onset  . Stroke Mother   . Brain cancer  Mother   . Lung cancer Mother   . Heart attack Mother   . Emphysema Father   . Alzheimer's disease Sister   . Dementia Other     evident after a fall with a hip fracture  . Colon cancer Neg Hx     Social History   Social History  . Marital status: Married    Spouse name: [name]  . Number of children: 2  . Years of education: Grad sch.   Occupational History  .      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 Smoking status: Never Smoker  . Smokeless tobacco: Never Used  . Alcohol use 0.0 oz/week     Comment: 1 glass every 2 months; socially  . Drug use: No  . Sexual activity: Not Asked   Other Topics Concern  . None   Social History Narrative   Patient is married [name]) and lives at home with her husband.   Patient has two adult children.   Patient is retired.   Patient has a Production designer, theatre/television/film.   Patient is right-handed.   Patient drinks 1 1/2 cups of coffee daily.   Review of Systems - See HPI.  All other ROS are negative.  BP 118/70   Pulse 75   Temp 98.9 F (37.2 C) (Oral)  Resp 14   Ht 5' 5"  (1.651 m)   Wt 168 lb (76.2 kg)   SpO2 97%   BMI 27.96 kg/m   Physical Exam  Constitutional: She is oriented to person, place, and time and well-developed, well-nourished, and in no distress.  HENT:  Head: Normocephalic and atraumatic.  Right Ear: External ear normal.  Left Ear: External ear normal.  Nose: Rhinorrhea present. Right sinus exhibits no maxillary sinus tenderness and no frontal sinus tenderness. Left sinus exhibits no maxillary sinus tenderness and no frontal sinus tenderness.  Mouth/Throat: Uvula is midline, oropharynx is clear and moist and mucous membranes are normal. No oropharyngeal exudate, posterior oropharyngeal edema or posterior oropharyngeal erythema.  Eyes: Conjunctivae are normal.  Neck: Neck supple.  Cardiovascular: Normal rate, regular rhythm, normal  heart sounds and intact distal pulses.   Pulmonary/Chest: Effort normal and breath sounds normal. No respiratory distress. She has no wheezes. She has no rales. She exhibits no tenderness.  Neurological: She is alert and oriented to person, place, and time.  Skin: Skin is warm and dry. No rash noted.  Psychiatric: Affect normal.    Recent Results (from the past 2160 hour(s))  Carbamazepine level, total     Status: None   Collection Time: 12/28/16  9:39 AM  Result Value Ref Range   Carbamazepine (Tegretol), S 6.4 4.0 - 12.0 ug/mL    Comment:          In conjunction with other antiepileptic drugs                                Therapeutic  4.0 -  8.0                                Toxicity     9.0 - 12.0                                    Carbamazepine alone                                Therapeutic  8.0 - 12.0                                 Detection Limit =  2.0                           <2.0 indicated None Detected   Comprehensive metabolic panel     Status: None   Collection Time: 12/28/16  9:39 AM  Result Value Ref Range   Glucose 92 65 - 99 mg/dL   BUN 19 8 - 27 mg/dL   Creatinine, Ser 0.85 0.57 - 1.00 mg/dL   GFR calc non Af Amer 70 >59 mL/min/1.73   GFR calc Af Amer 80 >59 mL/min/1.73   BUN/Creatinine Ratio 22 12 - 28   Sodium 143 134 - 144 mmol/L   Potassium 4.3 3.5 - 5.2 mmol/L   Chloride 105 96 - 106 mmol/L   CO2 26 18 - 29 mmol/L   Calcium 9.4 8.7 - 10.3 mg/dL   Total Protein 6.2 6.0 - 8.5 g/dL   Albumin 4.0 3.5 -  4.8 g/dL   Globulin, Total 2.2 1.5 - 4.5 g/dL   Albumin/Globulin Ratio 1.8 1.2 - 2.2   Bilirubin Total <0.2 0.0 - 1.2 mg/dL   Alkaline Phosphatase 77 39 - 117 IU/L   AST 19 0 - 40 IU/L   ALT 15 0 - 32 IU/L  CBC     Status: None   Collection Time: 12/28/16  9:39 AM  Result Value Ref Range   WBC 4.1 3.4 - 10.8 x10E3/uL   RBC 4.02 3.77 - 5.28 x10E6/uL   Hemoglobin 11.7 11.1 - 15.9 g/dL   Hematocrit 37.1 34.0 - 46.6 %   MCV 92 79 - 97 fL   MCH 29.1  26.6 - 33.0 pg   MCHC 31.5 31.5 - 35.7 g/dL   RDW 14.6 12.3 - 15.4 %   Platelets 284 150 - 379 x10E3/uL  Protime-INR     Status: None   Collection Time: 02/26/17  2:50 PM  Result Value Ref Range   Prothrombin Time 14.5 11.4 - 15.2 seconds   INR 1.12   APTT     Status: None   Collection Time: 02/26/17  2:50 PM  Result Value Ref Range   aPTT 32 24 - 36 seconds  CBC     Status: Abnormal   Collection Time: 02/26/17  2:50 PM  Result Value Ref Range   WBC 13.0 (H) 4.0 - 10.5 K/uL   RBC 3.93 3.87 - 5.11 MIL/uL   Hemoglobin 11.3 (L) 12.0 - 15.0 g/dL   HCT 35.2 (L) 36.0 - 46.0 %   MCV 89.6 78.0 - 100.0 fL   MCH 28.8 26.0 - 34.0 pg   MCHC 32.1 30.0 - 36.0 g/dL   RDW 13.7 11.5 - 15.5 %   Platelets 184 150 - 400 K/uL  Differential     Status: Abnormal   Collection Time: 02/26/17  2:50 PM  Result Value Ref Range   Neutrophils Relative % 78 %   Neutro Abs 10.3 (H) 1.7 - 7.7 K/uL   Lymphocytes Relative 12 %   Lymphs Abs 1.5 0.7 - 4.0 K/uL   Monocytes Relative 9 %   Monocytes Absolute 1.1 (H) 0.1 - 1.0 K/uL   Eosinophils Relative 1 %   Eosinophils Absolute 0.1 0.0 - 0.7 K/uL   Basophils Relative 0 %   Basophils Absolute 0.0 0.0 - 0.1 K/uL  Comprehensive metabolic panel     Status: Abnormal   Collection Time: 02/26/17  2:50 PM  Result Value Ref Range   Sodium 139 135 - 145 mmol/L   Potassium 3.6 3.5 - 5.1 mmol/L   Chloride 107 101 - 111 mmol/L   CO2 25 22 - 32 mmol/L   Glucose, Bld 93 65 - 99 mg/dL   BUN 11 6 - 20 mg/dL   Creatinine, Ser 0.77 0.44 - 1.00 mg/dL   Calcium 8.7 (L) 8.9 - 10.3 mg/dL   Total Protein 6.0 (L) 6.5 - 8.1 g/dL   Albumin 3.2 (L) 3.5 - 5.0 g/dL   AST 63 (H) 15 - 41 U/L   ALT 78 (H) 14 - 54 U/L   Alkaline Phosphatase 76 38 - 126 U/L   Total Bilirubin 0.3 0.3 - 1.2 mg/dL   GFR calc non Af Amer >60 >60 mL/min   GFR calc Af Amer >60 >60 mL/min    Comment: (NOTE) The eGFR has been calculated using the CKD EPI equation. This calculation has not been validated  in all clinical situations. eGFR's persistently <60 mL/min  signify possible Chronic Kidney Disease.    Anion gap 7 5 - 15  CBG monitoring, ED     Status: None   Collection Time: 02/26/17  2:55 PM  Result Value Ref Range   Glucose-Capillary 82 65 - 99 mg/dL  I-stat troponin, ED     Status: None   Collection Time: 02/26/17  3:11 PM  Result Value Ref Range   Troponin i, poc 0.00 0.00 - 0.08 ng/mL   Comment 3            Comment: Due to the release kinetics of cTnI, a negative result within the first hours of the onset of symptoms does not rule out myocardial infarction with certainty. If myocardial infarction is still suspected, repeat the test at appropriate intervals.   I-Stat Chem 8, ED     Status: Abnormal   Collection Time: 02/26/17  3:14 PM  Result Value Ref Range   Sodium 139 135 - 145 mmol/L   Potassium 3.6 3.5 - 5.1 mmol/L   Chloride 107 101 - 111 mmol/L   BUN 12 6 - 20 mg/dL   Creatinine, Ser 0.70 0.44 - 1.00 mg/dL   Glucose, Bld 93 65 - 99 mg/dL   Calcium, Ion 1.06 (L) 1.15 - 1.40 mmol/L   TCO2 24 0 - 100 mmol/L   Hemoglobin 10.9 (L) 12.0 - 15.0 g/dL   HCT 32.0 (L) 36.0 - 46.0 %  Influenza panel by PCR (type A & B)     Status: None   Collection Time: 02/26/17  4:21 PM  Result Value Ref Range   Influenza A By PCR NEGATIVE NEGATIVE   Influenza B By PCR NEGATIVE NEGATIVE    Comment: (NOTE) The Xpert Xpress Flu assay is intended as an aid in the diagnosis of  influenza and should not be used as a sole basis for treatment.  This  assay is FDA approved for nasopharyngeal swab specimens only. Nasal  washings and aspirates are unacceptable for Xpert Xpress Flu testing.   Carbamazepine level, total     Status: Abnormal   Collection Time: 02/26/17  4:21 PM  Result Value Ref Range   Carbamazepine Lvl 12.7 (H) 4.0 - 12.0 ug/mL  Creatinine, serum     Status: None   Collection Time: 02/26/17  4:21 PM  Result Value Ref Range   Creatinine, Ser 0.76 0.44 - 1.00 mg/dL   GFR  calc non Af Amer >60 >60 mL/min   GFR calc Af Amer >60 >60 mL/min    Comment: (NOTE) The eGFR has been calculated using the CKD EPI equation. This calculation has not been validated in all clinical situations. eGFR's persistently <60 mL/min signify possible Chronic Kidney Disease.   Urinalysis, Routine w reflex microscopic     Status: Abnormal   Collection Time: 02/26/17  4:30 PM  Result Value Ref Range   Color, Urine YELLOW YELLOW   APPearance HAZY (A) CLEAR   Specific Gravity, Urine 1.018 1.005 - 1.030   pH 5.0 5.0 - 8.0   Glucose, UA NEGATIVE NEGATIVE mg/dL   Hgb urine dipstick SMALL (A) NEGATIVE   Bilirubin Urine NEGATIVE NEGATIVE   Ketones, ur 5 (A) NEGATIVE mg/dL   Protein, ur 30 (A) NEGATIVE mg/dL   Nitrite NEGATIVE NEGATIVE   Leukocytes, UA NEGATIVE NEGATIVE   RBC / HPF 0-5 0 - 5 RBC/hpf   WBC, UA 6-30 0 - 5 WBC/hpf   Bacteria, UA NONE SEEN NONE SEEN   Squamous Epithelial / LPF 0-5 (A)  NONE SEEN   Mucous PRESENT    Amorphous Crystal PRESENT   Basic metabolic panel     Status: Abnormal   Collection Time: 02/27/17  5:14 AM  Result Value Ref Range   Sodium 140 135 - 145 mmol/L   Potassium 3.0 (L) 3.5 - 5.1 mmol/L   Chloride 109 101 - 111 mmol/L   CO2 23 22 - 32 mmol/L   Glucose, Bld 100 (H) 65 - 99 mg/dL   BUN 5 (L) 6 - 20 mg/dL   Creatinine, Ser 0.66 0.44 - 1.00 mg/dL   Calcium 8.0 (L) 8.9 - 10.3 mg/dL   GFR calc non Af Amer >60 >60 mL/min   GFR calc Af Amer >60 >60 mL/min    Comment: (NOTE) The eGFR has been calculated using the CKD EPI equation. This calculation has not been validated in all clinical situations. eGFR's persistently <60 mL/min signify possible Chronic Kidney Disease.    Anion gap 8 5 - 15  CBC     Status: Abnormal   Collection Time: 02/27/17  5:14 AM  Result Value Ref Range   WBC 8.0 4.0 - 10.5 K/uL   RBC 3.49 (L) 3.87 - 5.11 MIL/uL   Hemoglobin 10.0 (L) 12.0 - 15.0 g/dL   HCT 31.3 (L) 36.0 - 46.0 %   MCV 89.7 78.0 - 100.0 fL   MCH 28.7  26.0 - 34.0 pg   MCHC 31.9 30.0 - 36.0 g/dL   RDW 13.8 11.5 - 15.5 %   Platelets 165 150 - 400 K/uL  Lipid panel     Status: None   Collection Time: 02/27/17  5:14 AM  Result Value Ref Range   Cholesterol 162 0 - 200 mg/dL   Triglycerides 89 <150 mg/dL   HDL 59 >40 mg/dL   Total CHOL/HDL Ratio 2.7 RATIO   VLDL 18 0 - 40 mg/dL   LDL Cholesterol 85 0 - 99 mg/dL    Comment:        Total Cholesterol/HDL:CHD Risk Coronary Heart Disease Risk Table                     Men   Women  1/2 Average Risk   3.4   3.3  Average Risk       5.0   4.4  2 X Average Risk   9.6   7.1  3 X Average Risk  23.4   11.0        Use the calculated Patient Ratio above and the CHD Risk Table to determine the patient's CHD Risk.        ATP III CLASSIFICATION (LDL):  <100     mg/dL   Optimal  100-129  mg/dL   Near or Above                    Optimal  130-159  mg/dL   Borderline  160-189  mg/dL   High  >190     mg/dL   Very High   Hemoglobin A1c     Status: None   Collection Time: 02/27/17  5:14 AM  Result Value Ref Range   Hgb A1c MFr Bld 5.5 4.8 - 5.6 %    Comment: (NOTE)         Pre-diabetes: 5.7 - 6.4         Diabetes: >6.4         Glycemic control for adults with diabetes: <7.0    Mean Plasma Glucose 111  mg/dL    Comment: (NOTE) Performed At: [facility] [place], [place] [id number] Lindon Romp MD [id number]   ECHOCARDIOGRAM COMPLETE     Status: Abnormal   Collection Time: 02/27/17  9:47 AM  Result Value Ref Range   Weight 2,821.89 oz   Height 65 in   BP 121/62 mmHg   LV PW d 8.4 (A) 0.6 - 1.1 mm   FS 38 28 - 44 %   LA vol 43.7 mL   LA ID, A-P, ES 38 mm   IVS/LV PW RATIO, ED 1.04    LVOT VTI 27.1 cm   Reg peak vel 341 cm/s   RV sys press 50 mmHg   LV e' LATERAL 12.3 cm/s   LV E/e' medial 7.18    LV E/e'average 7.18    LA diam index 2.02 cm/m2   LA vol A4C 41.5 ml   LVOT peak grad rest 5 mmHg   E decel time 158 msec   LVOT diameter 20 mm   LVOT  area 3.14 cm2   LVOT peak vel 107 cm/s   LVOT SV 85.00 mL   Peak grad 3 mmHg   E/e' ratio 7.18    MV pk E vel 88.3 m/s   TR max vel 341 cm/s   MV pk A vel 75.2 m/s   LA vol index 23.2 mL/m2   MV Dec 158    LA diam end sys 38.00 mm   TDI e' medial 7.72    TDI e' lateral 12.30    Lateral S' vel 13.50 cm/sec   TAPSE 26.10 mm  VAS US CAROTID     Status: None   Collection Time: 02/27/17  9:59 AM  Result Value Ref Range   Right CCA prox sys -127 cm/s   Right CCA prox dias -37 cm/s   Right cca dist sys -128 cm/s   Left CCA prox sys 85 cm/s   Left CCA prox dias 23 cm/s   Left CCA dist sys -94 cm/s   Left CCA dist dias -30 cm/s   Left ICA prox sys -96 cm/s   Left ICA prox dias -34 cm/s   Left ICA dist sys -148 cm/s   Left ICA dist dias -55 cm/s   RIGHT ECA DIAS -18.00 cm/s   RIGHT VERTEBRAL DIAS -25.00 cm/s   LEFT ECA DIAS -17.00 cm/s   LEFT VERTEBRAL DIAS 34.00 cm/s   Assessment/Plan: 1. Viral URI with cough 2 days of symptoms. Afebrile. Suspect picked up something while in hospital. Examination unremarkable except for rhinorrhea. Supportive measures reviewed. Continue Mucinex. Rx flonase and tessalon.  2. Strep throat Exam looks good. Finish ABX given by UC. Follow-up if any recurrence of symptoms.  3. Transient cerebral ischemia, unspecified type Diagnosed in ER. Kept 24 hours for observation. Has F/U scheduled with Neurology and PCP. Was started on 81 mg ASA and Lipitor. Encouraged her to take as directed. Continue chronic medications as directed by PCP. F/U as scheduled. Alarm signs/symptoms discussed that would prompt need for ER assessment.    Leeanne Rio, PA-C

## 2017-03-01 NOTE — Telephone Encounter (Signed)
Transition Care Management Follow-up Telephone Call   Date discharged? 02/27/2017   How have you been since you were released from the hospital? "feel really good about hospital stay"   Do you understand why you were in the hospital? yes, "Possible TIA"   Do you understand the discharge instructions? Yes. Hesitant to start Lipitor, wants to talk to PCP prior to starting.    Where were you discharged to? Home.    Items Reviewed:  Medications reviewed: yes  Allergies reviewed: yes  Dietary changes reviewed: No changes.   Referrals reviewed: no   Functional Questionnaire:   Activities of Daily Living (ADLs):   She states they are independent in the following: ambulation, bathing and hygiene, feeding, continence, grooming, toileting and dressing States they require assistance with the following: None.    Any transportation issues/concerns?: no   Any patient concerns? No   Confirmed importance and date/time of follow-up visits scheduled yes  Provider Appointment booked with PCP on Friday, 03/12/17 @ 11am.   Confirmed with patient if condition begins to worsen call PCP or go to the ER.  Patient was given the office number and encouraged to call back with question or concerns.  : yes

## 2017-03-01 NOTE — Patient Instructions (Signed)
Please finish antibiotics given by the Urgent Care provider.  Between that and the antibiotic given in the Hospital, current symptoms seem viral in nature.  Start saline nasal spray. Also I am sending in Flonase for you to use as directed. Ok to continue the [employer]. I am sending in the Tessalon to help with cough as well.  If sore throat recurs or other symptoms are not improving over the next 48 hours, please call or come see [place].

## 2017-03-01 NOTE — Telephone Encounter (Signed)
Patient states she was discharged from hospital this weekend and wants to be worked in with pcp when she returns back to the office from vacation.  Patient was scheduled to see PA for acute condition on 03/01/17.  However, she states she does not want to see PA for post hospital followup, prefers to see pcp for this.

## 2017-03-01 NOTE — Progress Notes (Signed)
Pre visit review using our clinic review tool, if applicable. No additional management support is needed unless otherwise documented below in the visit note. 

## 2017-03-02 NOTE — Telephone Encounter (Signed)
Pt has an appt on 03-12-17 @ 11 am.

## 2017-03-08 ENCOUNTER — Ambulatory Visit (INDEPENDENT_AMBULATORY_CARE_PROVIDER_SITE_OTHER): Payer: PPO | Admitting: Nurse Practitioner

## 2017-03-08 ENCOUNTER — Encounter: Payer: Self-pay | Admitting: Nurse Practitioner

## 2017-03-08 VITALS — BP 108/64 | HR 78 | Resp 16 | Wt 167.8 lb

## 2017-03-08 DIAGNOSIS — G4730 Sleep apnea, unspecified: Secondary | ICD-10-CM

## 2017-03-08 DIAGNOSIS — B0222 Postherpetic trigeminal neuralgia: Secondary | ICD-10-CM

## 2017-03-08 DIAGNOSIS — Z5181 Encounter for therapeutic drug level monitoring: Secondary | ICD-10-CM | POA: Diagnosis not present

## 2017-03-08 DIAGNOSIS — G459 Transient cerebral ischemic attack, unspecified: Secondary | ICD-10-CM | POA: Diagnosis not present

## 2017-03-08 NOTE — Progress Notes (Signed)
[facility]  PATIENT: Laura Stuart DOB: 07-15-1946   REASON FOR VISIT: Follow-up for trigeminal neuralgia obstructive sleep apnea, questionable TIA HISTORY FROM: Patient    HISTORY OF PRESENT ILLNESS:UPDATE 04/09/2018CM Laura Stuart, 71 year old female returns for follow-up. She has a history of trigeminal neuralgia and obstructive sleep apnea. Her Tegretol was increased at her last visit March 8th. She had hospital admission on 02/26/2017 for abnormal movements and weakness. She had been on a road trip with her family, diagnosed in an urgent care with strep throat started on antibiotics. Today she returned home she read in the car with a family member who had been diagnosed with the flu. The following morning she was extremely weak and could not walk and she had trouble speaking family brought her to the emergency room. MRI of the brain MRA of the brain no acute stroke seen. Mild chronic small vessel ischemic changes noted. 2-D echo ejection fraction was normal carotid Doppler unremarkable.  She remains on aspirin for secondary stroke prevention without further stroke or TIA symptoms. She has no bruising or bleeding. She remains on Lipitor  for hyperlipidemia. LDL 85. She denies any myalgias. She returns for reevaluation Interval history from 02/04/2017,CD Laura Stuart reports that the trigeminal neuralgia has worsened and the area that  is affected is enlarged. It involves an area over the upper lip of the right side of the face as well as above the right eyebrow radiating slightly towards the temple not reaching the retroauricular region or the maxillary area. She was treated on Gabapentin  before she changed to Tegretol.  I will increase the dose. The  last  " trigeminal attack"  was 12 years ago.     REVIEW OF SYSTEMS: Full 14 system review of systems performed and notable only for those listed, all others are neg:  Constitutional: neg  Cardiovascular:  neg Ear/Nose/Throat: neg  Skin: neg Eyes: neg Respiratory: neg Gastroitestinal: neg  Hematology/Lymphatic: neg  Endocrine: neg Musculoskeletal:neg Allergy/Immunology: neg Neurological: Occasional dizziness Psychiatric: neg Sleep : neg   ALLERGIES: Allergies  Allergen Reactions  . Penicillins Other (See Comments)    From allergy testing, unsure if allergy exists    HOME MEDICATIONS: Outpatient Medications Prior to Visit  Medication Sig Dispense Refill  . aspirin EC 81 MG EC tablet Take 1 tablet (81 mg total) by mouth daily. 30 tablet 0  . atorvastatin (LIPITOR) 10 MG tablet Take 1 tablet (10 mg total) by mouth daily at 6 PM. 30 tablet 0  . benzonatate (TESSALON) 100 MG capsule Take 1 capsule (100 mg total) by mouth 2 (two) times daily as needed for cough. 20 capsule 0  . Calcium Carb-Cholecalciferol (CALCIUM CARBONATE-VITAMIN D3 PO) Take 1 tablet by mouth daily.     . TEGRETOL 200 MG tablet Take 200 in AM and 200 mg at lunch time, separate prescription provided for 400 mg at bedtime 60 tablet 0  . zolpidem (AMBIEN) 10 MG tablet Take 1 tablet (10 mg total) by mouth at bedtime as needed for sleep. 30 tablet 5  . fluticasone (FLONASE) 50 MCG/ACT nasal spray Place 2 sprays into both nostrils daily. (Patient not taking: Reported on 03/08/2017) 16 g 0  . Halcinonide (HALOG) 0.1 % CREA Apply 1 application topically daily as needed (sparingly for itching).     . penicillin v potassium (VEETID) 500 MG tablet Take 500 mg by mouth every 8 (eight) hours.  0   No facility-administered medications prior to visit.  PAST MEDICAL HISTORY: Past Medical History:  Diagnosis Date  . Allergy   . Atypical facial pain   . Neuralgia    Trigeminal  . Sleep apnea    using cpap,OSA  . Trigeminal neuralgia     PAST SURGICAL HISTORY: Past Surgical History:  Procedure Laterality Date  . ROOT CANAL  06/2013  . toes right foot  2010  . TONSILLECTOMY AND ADENOIDECTOMY  1950  . torn miniscus   2003  . VAGINAL DELIVERY     X2    FAMILY HISTORY: Family History  Problem Relation Age of Onset  . Stroke Mother   . Brain cancer Mother   . Lung cancer Mother   . Heart attack Mother   . Emphysema Father   . Alzheimer's disease Sister   . Dementia Other     evident after a fall with a hip fracture  . Colon cancer Neg Hx     SOCIAL HISTORY: Social History   Social History  . Marital status: Married    Spouse name: [name]  . Number of children: 2  . Years of education: Grad sch.   Occupational History  .      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 Smoking status: Never Smoker  . Smokeless tobacco: Never Used  . Alcohol use 0.0 oz/week     Comment: 1 glass every 2 months; socially  . Drug use: No  . Sexual activity: Not on file   Other Topics Concern  . Not on file   Social History Narrative   Patient is married [name]) and lives at home with her husband.   Patient has two adult children.   Patient is retired.   Patient has a Production designer, theatre/television/film.   Patient is right-handed.   Patient drinks 1 1/2 cups of coffee daily.     PHYSICAL EXAM  Vitals:   03/08/17 0755  BP: 108/64  Pulse: 78  Resp: 16  Weight: 167 lb 12.8 oz (76.1 kg)   Body mass index is 27.92 kg/m.  Generalized: Well developed, in no acute distress  Head: normocephalic and atraumatic,. Oropharynx benign  Neck: Supple, no carotid bruits  Cardiac: Regular rate rhythm, no murmur  Musculoskeletal: No deformity   Neurological examination   Mentation: Alert oriented to time, place, history taking. Attention span and concentration appropriate. Recent and remote memory intact.  Follows all commands speech and language fluent.   Cranial nerve II-XII: Pupils were equal round reactive to light extraocular movements were full, visual field were full on confrontational test. Facial sensation and strength were normal.  hearing was intact to finger rubbing bilaterally. Uvula tongue midline. head turning and shoulder shrug were normal and symmetric.Tongue protrusion into cheek strength was normal. Motor: normal bulk and tone, full strength in the BUE, BLE, fine finger movements normal, no pronator drift. No focal weakness Sensory: normal and symmetric to light touch,In the upper and lower extremities Coordination: finger-nose-finger, heel-to-shin bilaterally, no dysmetria Reflexes: 1+ upper lower and symmetric, plantar responses were flexor bilaterally. Gait and Station: Rising up from seated position without assistance, normal stance,  moderate stride, good arm swing, smooth turning, able to perform tiptoe, and heel walking without difficulty. Tandem gait is steady  DIAGNOSTIC DATA (LABS, IMAGING, TESTING) - I reviewed patient records, labs, notes, testing and imaging myself where available.  Lab Results  Component Value Date   WBC 8.0 02/27/2017  HGB 10.0 (L) 02/27/2017   HCT 31.3 (L) 02/27/2017   MCV 89.7 02/27/2017   PLT 165 02/27/2017      Component Value Date/Time   NA 140 02/27/2017 0514   NA 143 12/28/2016 0939   K 3.0 (L) 02/27/2017 0514   CL 109 02/27/2017 0514   CO2 23 02/27/2017 0514   GLUCOSE 100 (H) 02/27/2017 0514   BUN 5 (L) 02/27/2017 0514   BUN 19 12/28/2016 0939   CREATININE 0.66 02/27/2017 0514   CREATININE 0.79 02/05/2013 1117   CALCIUM 8.0 (L) 02/27/2017 0514   PROT 6.0 (L) 02/26/2017 1450   PROT 6.2 12/28/2016 0939   ALBUMIN 3.2 (L) 02/26/2017 1450   ALBUMIN 4.0 12/28/2016 0939   AST 63 (H) 02/26/2017 1450   ALT 78 (H) 02/26/2017 1450   ALKPHOS 76 02/26/2017 1450   BILITOT 0.3 02/26/2017 1450   BILITOT <0.2 12/28/2016 0939   GFRNONAA >60 02/27/2017 0514   GFRAA >60 02/27/2017 0514   Lab Results  Component Value Date   CHOL 162 02/27/2017   HDL 59 02/27/2017   LDLCALC 85 02/27/2017   LDLDIRECT 106.9 05/22/2013   TRIG 89 02/27/2017   CHOLHDL 2.7 02/27/2017    Lab Results  Component Value Date   HGBA1C 5.5 02/27/2017    Lab Results  Component Value Date   TSH 2.83 08/20/2016      ASSESSMENT AND PLAN  71 y.o. year old female  has a past medical history of Allergy; Atypical facial pain; Neuralgia; Sleep apnea; and Trigeminal neuralgia. Recent hospital admission for abnormal movements and weakness. MRI of the brain and MRA were negative. Diagnosed with TIA   Will check Na level today due to increase in Tegretol to monitor for side effects Stressed the importance of management of risk factors to prevent further stroke Continue aspirin  for secondary stroke prevention Maintain strict control of hypertension with blood pressure goal below 130/90, today's reading 108/64 Cholesterol with LDL cholesterol less than 70, followed by primary care,  most recent 85 continue Lipitor Exercise by walking, slowly increase , eat healthy diet with whole grains,  fresh fruits and vegetables Continue CPAP as directed for obstructive sleep apnea Follow up 4 months Discussed risk for recurrent stroke/ TIA and answered additional questions This was a visit of 30 minutes and medical decision making of high complexity with extensive review of history, hospital chart, counseling and answering questions Dennie Bible, [facility], [facility], APRN  [facility] [address] [place], [place] [address] [phone]

## 2017-03-08 NOTE — Patient Instructions (Addendum)
Will check Na level  Stressed the importance of management of risk factors to prevent further stroke Continue aspirin  for secondary stroke prevention Maintain strict control of hypertension with blood pressure goal below 130/90, today's reading 108/64 Cholesterol with LDL cholesterol less than 70, followed by primary care,  most recent 85 continue Lipitor Exercise by walking, slowly increase , eat healthy diet with whole grains,  fresh fruits and vegetables Continue CPAP  Follow up 4 months

## 2017-03-08 NOTE — Progress Notes (Signed)
I agree with the assessment and plan as directed by NP .The patient is known to me .   Brandyn Lowrey, MD  

## 2017-03-09 ENCOUNTER — Telehealth: Payer: Self-pay | Admitting: *Deleted

## 2017-03-09 LAB — BASIC METABOLIC PANEL
BUN/Creatinine Ratio: 20 (ref 12–28)
BUN: 17 mg/dL (ref 8–27)
CO2: 27 mmol/L (ref 18–29)
Calcium: 9.9 mg/dL (ref 8.7–10.3)
Chloride: 105 mmol/L (ref 96–106)
Creatinine, Ser: 0.85 mg/dL (ref 0.57–1.00)
GFR, EST AFRICAN AMERICAN: 80 mL/min/{1.73_m2} (ref >59)
GFR, EST NON AFRICAN AMERICAN: 70 mL/min/{1.73_m2} (ref >59)
Glucose: 83 mg/dL (ref 65–99)
POTASSIUM: 5.3 mmol/L — AB (ref 3.5–5.2)
SODIUM: 147 mmol/L — AB (ref 134–144)

## 2017-03-09 NOTE — Telephone Encounter (Signed)
Per Hoyle Sauer, NP spoke with patient and informed her that her lab results are okay. She verbalized understanding, appreciation.

## 2017-03-12 ENCOUNTER — Encounter: Payer: Self-pay | Admitting: Family Medicine

## 2017-03-12 ENCOUNTER — Ambulatory Visit (INDEPENDENT_AMBULATORY_CARE_PROVIDER_SITE_OTHER): Payer: PPO | Admitting: Family Medicine

## 2017-03-12 VITALS — BP 110/72 | HR 74 | Temp 98.1°F | Resp 16 | Ht 65.0 in | Wt 167.5 lb

## 2017-03-12 DIAGNOSIS — G459 Transient cerebral ischemic attack, unspecified: Secondary | ICD-10-CM | POA: Diagnosis not present

## 2017-03-12 NOTE — Patient Instructions (Signed)
Follow up as needed/scheduled No further work up at this time Continue the baby aspirin daily Continue the Lipitor and we'll repeat the labs at our next visit Call with any questions or concerns Happy Spring!!!

## 2017-03-12 NOTE — Progress Notes (Signed)
   Subjective:    Patient ID: Laura Stuart, female    DOB: June 13, 1946, 71 y.o.   MRN: [id number]  HPI TIA- pt was seen on 3/30 and hospitalized w/ TIA after she was dx'd w/ strep/flu and had profound weakness and difficulty talking.  She had normal MRI/MRA, ECHO, Carotid during her hospitalization.  She had neuro f/u on 4/9- they recommended continued ASA use, and control of BP and lipids.  Pt feels strongly that she did not have a TIA and that it was exhaustion from travel (14 hr car ride) and physical illness.  Denies recent HA, memory issues, problems w/ speech, focal weakness/numbness.   Review of Systems For ROS see HPI     Objective:   Physical Exam  Constitutional: She is oriented to person, place, and time. She appears well-developed and well-nourished. No distress.  HENT:  Head: Normocephalic and atraumatic.  TMs WNL No TTP over sinuses Minimal nasal congestion  Eyes: Conjunctivae and EOM are normal. Pupils are equal, round, and reactive to light.  Neck: Normal range of motion. Neck supple.  Cardiovascular: Normal rate, regular rhythm, normal heart sounds and intact distal pulses.   Pulmonary/Chest: Effort normal and breath sounds normal. No respiratory distress. She has no wheezes. She has no rales.  Lymphadenopathy:    She has no cervical adenopathy.  Neurological: She is alert and oriented to person, place, and time. She has normal reflexes. No cranial nerve deficit. Coordination normal.  Psychiatric: She has a normal mood and affect. Her behavior is normal. Judgment and thought content normal.  Vitals reviewed.         Assessment & Plan:

## 2017-03-12 NOTE — Progress Notes (Signed)
Pre visit review using our clinic review tool, if applicable. No additional management support is needed unless otherwise documented below in the visit note. 

## 2017-03-12 NOTE — Assessment & Plan Note (Signed)
Based on pt's report, it does not sound like she had a TIA but rather it was a perfect storm of exhaustion from overnight travel, confusion over Friday being a 'holiday', physical illness.  Pt's BP and cholesterol are WNL.  Now on ASA as preventative.  Has had neuro f/u since then.  Reviewed d/c summary and labs/imaging reports.  No further w/u needed.  Pt is in agreement w/ this.

## 2017-03-15 DIAGNOSIS — H524 Presbyopia: Secondary | ICD-10-CM | POA: Diagnosis not present

## 2017-03-16 ENCOUNTER — Telehealth: Payer: Self-pay | Admitting: Family Medicine

## 2017-03-16 NOTE — Telephone Encounter (Signed)
Called pt and left a detailed message on voicemail to inform of PCP recommendations.   Pt was advised to call back if she needs a referral for eye doctor.

## 2017-03-16 NOTE — Telephone Encounter (Signed)
Pt LMOVM stating that since she has been taking lipitor that she has been expiring issues with her eye sight and asking if the medication could have something to due with that.

## 2017-03-16 NOTE — Telephone Encounter (Signed)
This is not a side effect that I am familiar with and it would be very unusual.  If having visual issues, I recommend that she schedule an appt with her eye doctor for complete evaluation.  We can help with a referral if needed

## 2017-03-22 ENCOUNTER — Ambulatory Visit: Payer: PPO | Admitting: Podiatry

## 2017-03-25 ENCOUNTER — Encounter: Payer: Self-pay | Admitting: Podiatry

## 2017-03-25 ENCOUNTER — Ambulatory Visit (INDEPENDENT_AMBULATORY_CARE_PROVIDER_SITE_OTHER): Payer: PPO

## 2017-03-25 ENCOUNTER — Ambulatory Visit (INDEPENDENT_AMBULATORY_CARE_PROVIDER_SITE_OTHER): Payer: PPO | Admitting: Podiatry

## 2017-03-25 VITALS — BP 108/63 | HR 78 | Resp 16

## 2017-03-25 DIAGNOSIS — M216X2 Other acquired deformities of left foot: Secondary | ICD-10-CM

## 2017-03-25 DIAGNOSIS — M2042 Other hammer toe(s) (acquired), left foot: Secondary | ICD-10-CM

## 2017-03-25 NOTE — Patient Instructions (Signed)

## 2017-03-25 NOTE — Progress Notes (Signed)
   Subjective:    Patient ID: Laura Stuart, female    DOB: 03/31/1946, 71 y.o.   MRN: [id number]  HPI: She presents today chief complaint of painful hammertoe deformities #2 #3 of the left foot we performed surgery on her 7/2 years ago to correct her second third and fourth toes of the right foot. She's done very well with this and was happy with the outcome. She states that she is extremely healthy still doing very well and would like to have surgery to the left foot in early June. She states that she had a cardiac workup because of some mental irregularities was determined that she had an infection from a strep throat.    Review of Systems     Objective:   Physical Exam: Vital signs are stable alert and oriented 3. Pulses are palpable. Neurologic sensorium is intact. Degenerative flexors are intact. No peri-distress. Orthopedic evaluation demonstrates all joints is local full range of motion without crepitus she has contraction and medial deviation with dislocation of the second digit of the left foot. She also has hammertoe deformity and dislocation of the third digit at the metatarsophalangeal joint level left foot. Mild hallux interphalangeus was noted. Radiographs confirm the aforementioned dislocations and deformities. No fractures are identified. Cutaneous evaluation demonstrates a well-hydrated use no open lesions are noted.        Assessment & Plan:  Assessment: Pain in limb secondary to severe hammertoe deformities #2 #3 of the left foot. Plantarflexed elongated second metatarsals other culprits.  Plan: We discussed etiology pathology conservative versus surgical therapies. At this point she would like to consider surgical repair. We went over consent form today line by line number number giving her ample time to ask questions and she suffered regarding second and third metatarsal osteotomies hammertoe repairs #2 and #3 with screws and pins. I answered all the questions  regarding these procedures best mobility in layman's terms. Discussed appropriate possible postop complications which may include but are not limited to postop pain bleeding swelling infection recurrence and need for further surgery. We dispensed a cam walker and I will follow-up with her.

## 2017-03-26 ENCOUNTER — Other Ambulatory Visit: Payer: PPO

## 2017-03-30 ENCOUNTER — Telehealth: Payer: Self-pay | Admitting: Family Medicine

## 2017-03-30 MED ORDER — ATORVASTATIN CALCIUM 10 MG PO TABS: 10.0000 mg | ORAL_TABLET | Freq: Every day | ORAL | 1 refills | Status: DC

## 2017-03-30 NOTE — Telephone Encounter (Signed)
Pt states that she needs a refill on atorvastatin, [facility]

## 2017-03-30 NOTE — Telephone Encounter (Signed)
Medication filled to pharmacy as requested.   

## 2017-04-22 IMAGING — MR MR HEAD W/O CM
9 of 11 series · 31 of 48 positions shown · non-contrast
Comparison: Head CT 02/26/2017

CLINICAL DATA: Transient gait instability.

EXAM:
MRI HEAD WITHOUT CONTRAST
MRA HEAD WITHOUT CONTRAST
TECHNIQUE: Multiplanar, multiecho pulse sequences of the brain and surrounding
structures were obtained without intravenous contrast. Angiographic
images of the head were obtained using MRA technique without
contrast.

[Series 3: T1 · sagittal · 5.0mm · 0.47mm/px · 3 of 23 slices shown]
[im 1/23]
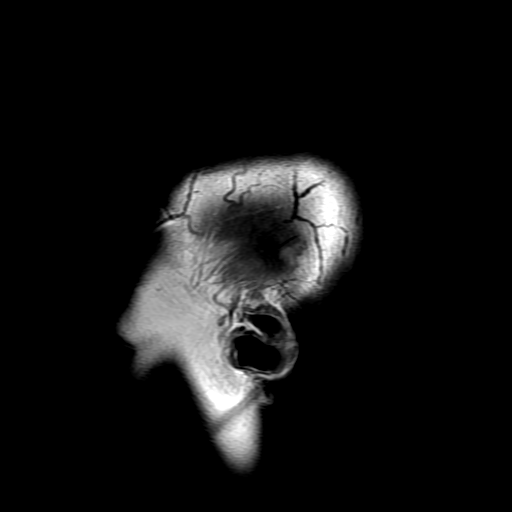
[im 12/23]
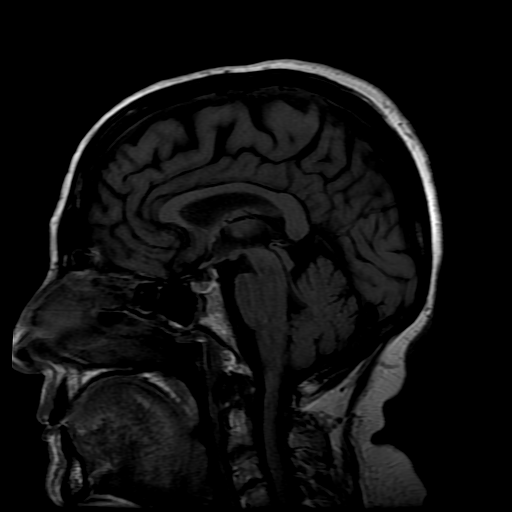
[im 23/23]
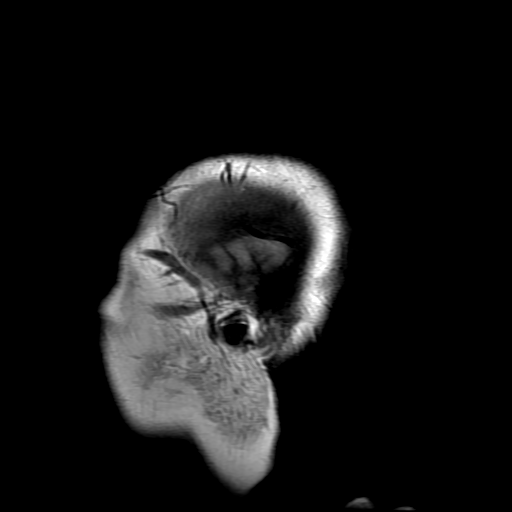

[Series 4: DWI · axial · 3.0mm · 1.09mm/px · z∈[-64,+68]mm · 7 of 90 slices shown (1 of 4)]
[im 1/90]
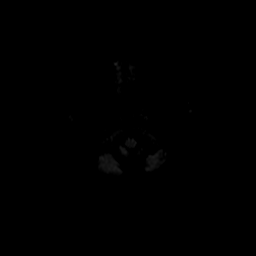
[im 15/90]
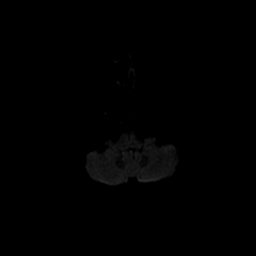
[im 30/90]
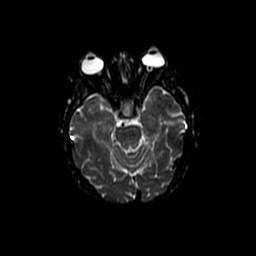
[im 45/90]
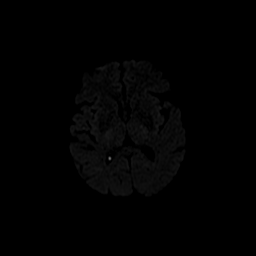
[im 60/90]
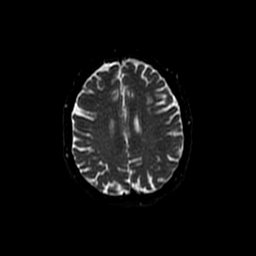
[im 75/90]
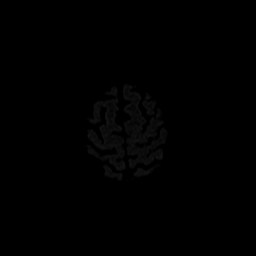
[im 90/90]
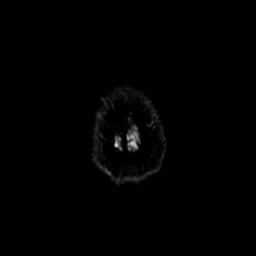

[Series 5: (id) mt fs · axial · 1.4mm · 0.43mm/px · z∈[-65,-27]mm · 4 of 136 slices shown]
[im 1/136]
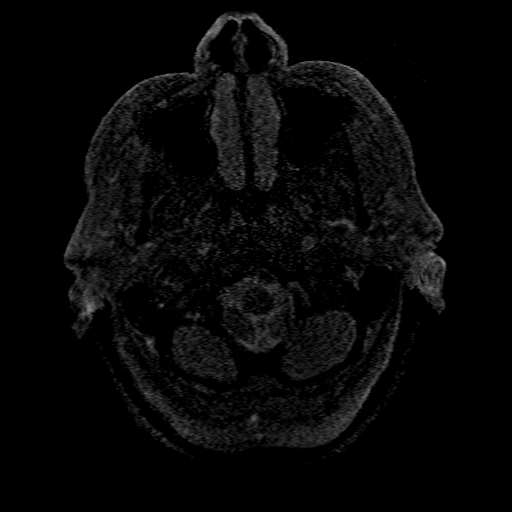
[im 28/136]
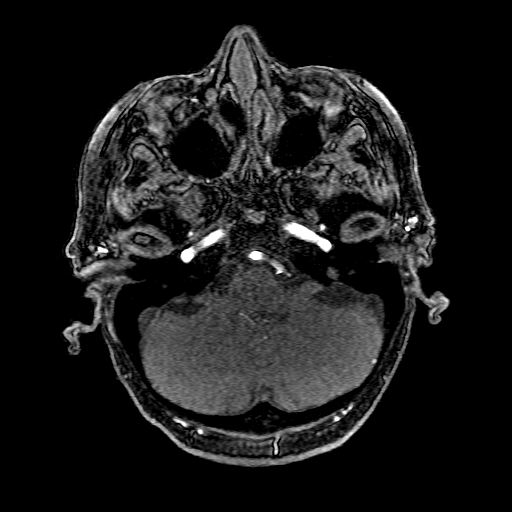
[im 41/136]
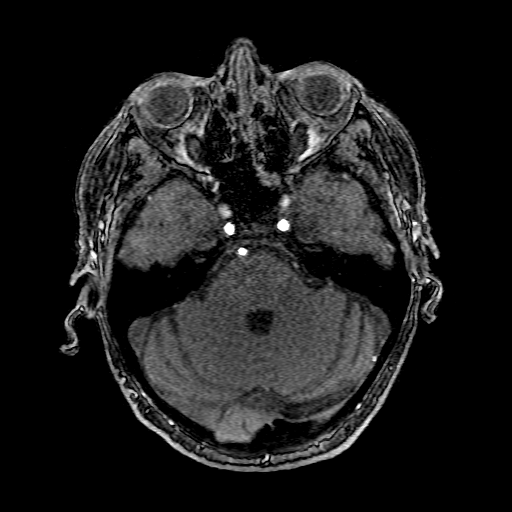
[im 55/136]
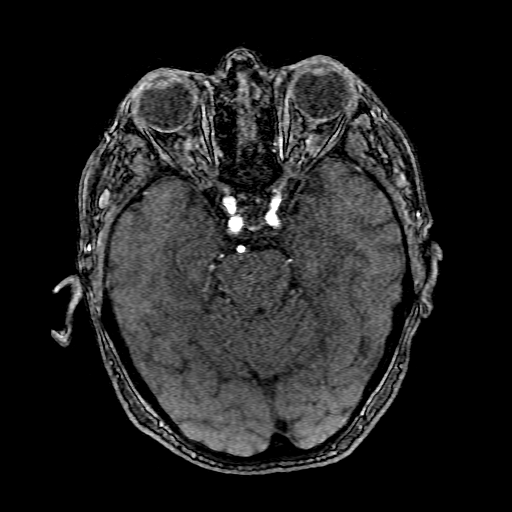

[Series 6: T2 · axial · 5.0mm · 0.43mm/px · z∈[-65,+72]mm · 2 of 24 slices shown (1 of 2)]
[im 1/24]
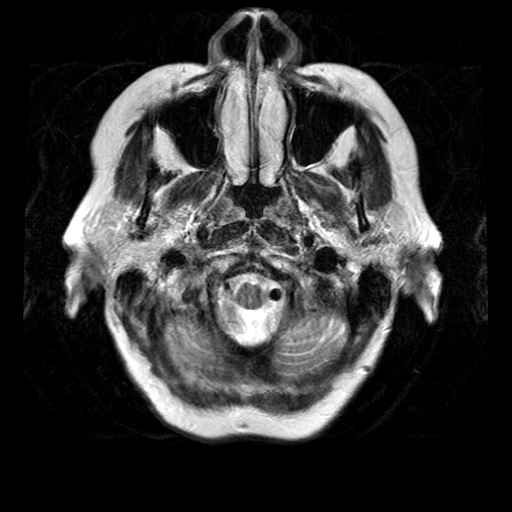
[im 24/24]
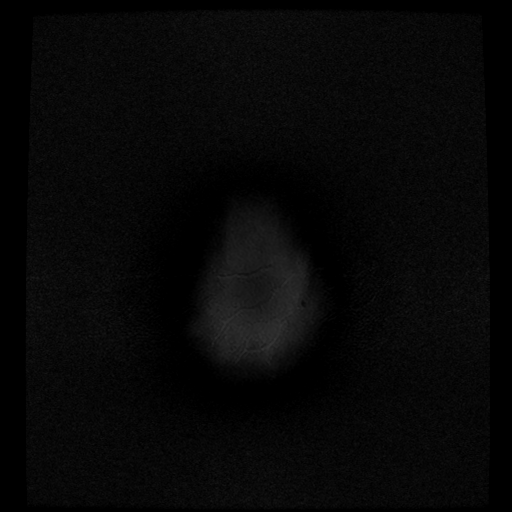

[Series 7: FLAIR · axial · 5.0mm · 0.43mm/px · z∈[-65,+72]mm · 2 of 24 slices shown]
[im 1/24]
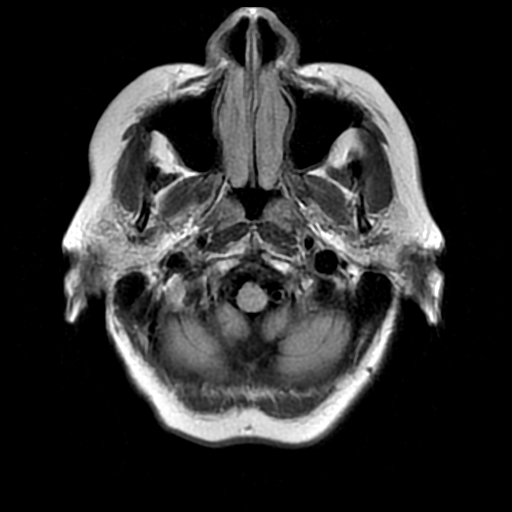
[im 24/24]
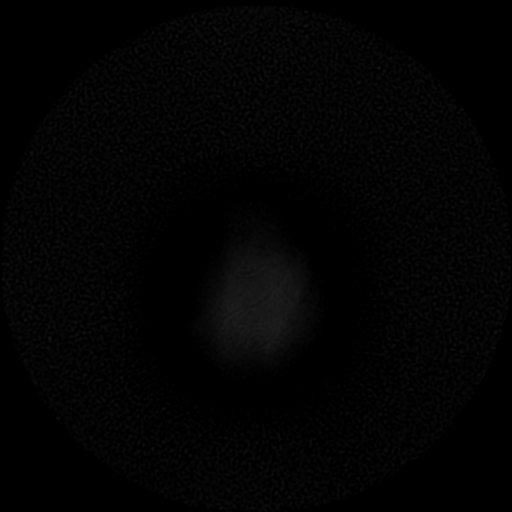

[Series 8: DWI · coronal · 5.0mm · 1.09mm/px · 5 of 60 slices shown (2 of 4)]
[im 1/60]
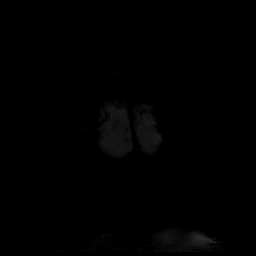
[im 15/60]
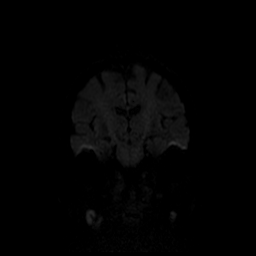
[im 30/60]
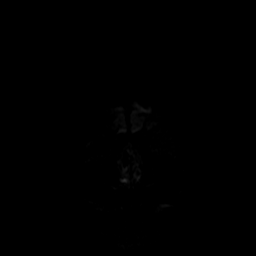
[im 45/60]
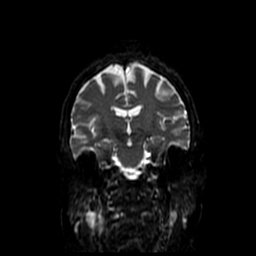
[im 60/60]
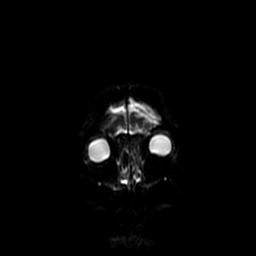

[Series 11: T2 · coronal · 5.0mm · 0.43mm/px · 2 of 25 slices shown (2 of 2)]
[im 1/25]
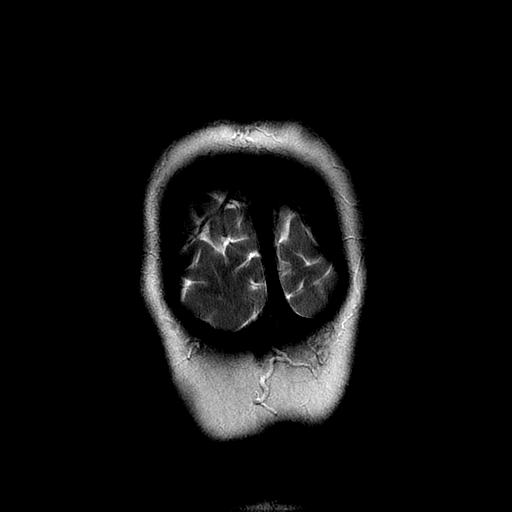
[im 25/25]
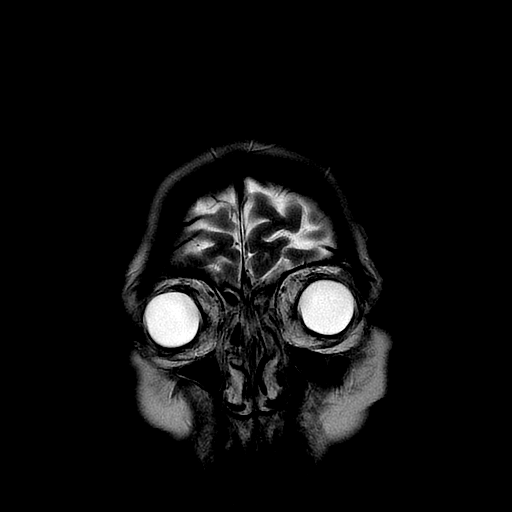

[Series 400: DWI · axial · 3.0mm · 1.09mm/px · z∈[-64,+68]mm · 4 of 45 slices shown (3 of 4)]
[im 1/45]
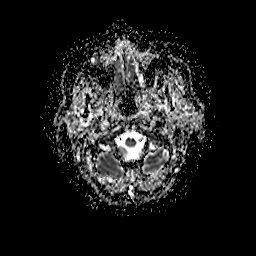
[im 15/45]
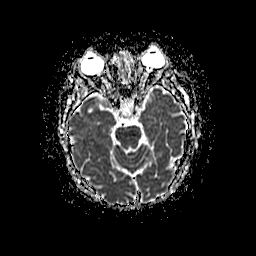
[im 30/45]
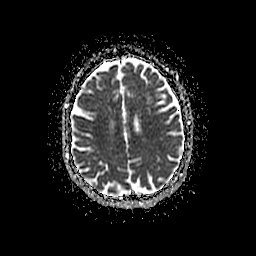
[im 45/45]
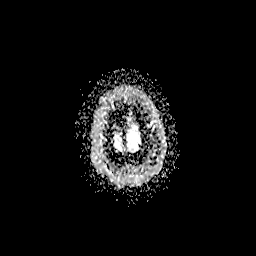

[Series 800: DWI · coronal · 5.0mm · 1.09mm/px · 2 of 30 slices shown (4 of 4)]
[im 1/30]
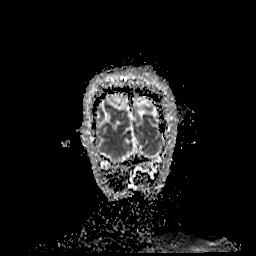
[im 30/30]
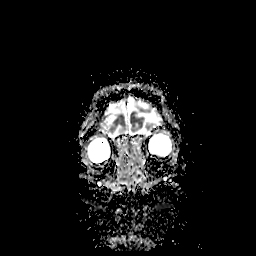

[31 of 48 positions shown; findings below may reference images not displayed]

FINDINGS: MRI HEAD FINDINGS

Brain: There is no evidence of acute infarct, intracranial
hemorrhage, mass, midline shift, or extra-axial fluid collection.
The ventricles and sulci are normal for age. Subcortical and deep
cerebral white matter T2 hyperintensities are nonspecific but
compatible with mild chronic small vessel ischemic disease.

Vascular: Major intracranial vascular flow voids are preserved.

Skull and upper cervical spine: Unremarkable bone marrow signal.

Sinuses/Orbits: Unremarkable orbits. Small volume right sphenoid
sinus fluid. Small left sphenoid sinus which is essentially
completely opacified. Clear mastoid air cells.

Other: None.

MRA HEAD FINDINGS

There is mild motion artifact.

The visualized distal vertebral arteries are widely patent to the
basilar with the left being moderately to strongly dominant. Patent
PICA, AICA, and SCA origins are visualized bilaterally. Basilar
artery is widely patent. There is a small right posterior
communicating artery. PCAs are patent without evidence of
significant stenosis.

The internal carotid arteries are widely patent from skullbase to
carotid termini. ACAs and MCAs are patent without evidence of major
branch occlusion or significant stenosis. No intracranial aneurysm
is identified.
IMPRESSION: 1. No acute intracranial abnormality.
2. Mild chronic small vessel ischemic disease.
3. Unremarkable head MRA.

## 2017-05-05 ENCOUNTER — Other Ambulatory Visit: Payer: Self-pay | Admitting: Podiatry

## 2017-05-05 MED ORDER — ONDANSETRON HCL 4 MG PO TABS: 4.0000 mg | ORAL_TABLET | Freq: Three times a day (TID) | ORAL | 0 refills | Status: DC | PRN

## 2017-05-05 MED ORDER — CLINDAMYCIN HCL 150 MG PO CAPS: 150.0000 mg | ORAL_CAPSULE | Freq: Three times a day (TID) | ORAL | 0 refills | Status: DC

## 2017-05-05 MED ORDER — HYDROMORPHONE HCL 4 MG PO TABS: 4.0000 mg | ORAL_TABLET | ORAL | 0 refills | Status: DC | PRN

## 2017-05-07 ENCOUNTER — Encounter: Payer: Self-pay | Admitting: Podiatry

## 2017-05-07 DIAGNOSIS — M2042 Other hammer toe(s) (acquired), left foot: Secondary | ICD-10-CM | POA: Diagnosis not present

## 2017-05-07 DIAGNOSIS — M25572 Pain in left ankle and joints of left foot: Secondary | ICD-10-CM | POA: Diagnosis not present

## 2017-05-07 DIAGNOSIS — M216X2 Other acquired deformities of left foot: Secondary | ICD-10-CM | POA: Diagnosis not present

## 2017-05-07 DIAGNOSIS — M21542 Acquired clubfoot, left foot: Secondary | ICD-10-CM | POA: Diagnosis not present

## 2017-05-07 DIAGNOSIS — E7800 Pure hypercholesterolemia, unspecified: Secondary | ICD-10-CM | POA: Diagnosis not present

## 2017-05-07 HISTORY — PX: FOOT SURGERY: SHX648

## 2017-05-13 ENCOUNTER — Ambulatory Visit (INDEPENDENT_AMBULATORY_CARE_PROVIDER_SITE_OTHER): Payer: PPO

## 2017-05-13 ENCOUNTER — Ambulatory Visit (INDEPENDENT_AMBULATORY_CARE_PROVIDER_SITE_OTHER): Payer: PPO | Admitting: Podiatry

## 2017-05-13 ENCOUNTER — Encounter: Payer: Self-pay | Admitting: Podiatry

## 2017-05-13 VITALS — BP 106/63 | HR 88 | Temp 98.2°F

## 2017-05-13 DIAGNOSIS — M2042 Other hammer toe(s) (acquired), left foot: Secondary | ICD-10-CM

## 2017-05-13 DIAGNOSIS — M216X2 Other acquired deformities of left foot: Secondary | ICD-10-CM

## 2017-05-15 ENCOUNTER — Telehealth: Payer: Self-pay | Admitting: Family Medicine

## 2017-05-15 NOTE — Telephone Encounter (Signed)
Keyes on call physician  FYI for Dr. Birdie Riddle  Recent foot surgery. Has now come off narcotics but was on dilaudid short term. Had been taking colace bust stopped. Last 2 days small BMs and feels some gassiness, lower abdominal cramping up to about 3/10 pain.   Opted to have her take 1 capful of miralax daily for 3 days and reach out to dr. Birdie Riddle on Monday or Tuesday if not improving. Discussed may take a day or two to have good BM with this regimen.   Garret Reddish, MD

## 2017-05-16 NOTE — Progress Notes (Signed)
She presents today 1 week status post second and third metatarsal osteotomies with hammertoe repairs #2 #3 the left foot with pin. She states that she's hasn't had much pain she's been taking ibuprofen as needed. Denies shortness of breath Pain fever chills nausea and vomiting.  Objective: Vital signs are stable alert and oriented 3 dry sterile dressing intact once removed demonstrates mild edema no erythema cellulitis drainage or odor. K wires are intact sutures are intact.  Assessment: Well-healing surgical foot left.  Plan: At this point we redressed today dressing compressive dressing will follow-up with her in 1 week.

## 2017-05-20 ENCOUNTER — Ambulatory Visit (INDEPENDENT_AMBULATORY_CARE_PROVIDER_SITE_OTHER): Payer: Self-pay | Admitting: Podiatry

## 2017-05-20 DIAGNOSIS — M2042 Other hammer toe(s) (acquired), left foot: Secondary | ICD-10-CM

## 2017-05-20 NOTE — Progress Notes (Signed)
She presents today 2 weeks status post second and third metatarsal osteotomies and hammertoe repair #2 and #3 of the left foot. She states that she seems to be doing pretty well at this point but the toe seems to be contracting.  Objective: Vital signs are stable she is alert and oriented 3. Pulses are palpable. Neurologic sensorium is intact she does have some slight contracture of the second toe dorsally. I would recommend we leave the stitches in for at least another week or so to make sure the days go on to heal uneventfully.  Assessment: Well-healing surgical foot.  Plan: We'll place her in a Darco digital splint to help bring the second toe down. I will follow-up with her in 1-2 weeks for suture removal.

## 2017-05-28 ENCOUNTER — Ambulatory Visit (INDEPENDENT_AMBULATORY_CARE_PROVIDER_SITE_OTHER): Payer: Self-pay | Admitting: Podiatry

## 2017-05-28 ENCOUNTER — Ambulatory Visit (INDEPENDENT_AMBULATORY_CARE_PROVIDER_SITE_OTHER): Payer: PPO

## 2017-05-28 ENCOUNTER — Encounter: Payer: Self-pay | Admitting: Podiatry

## 2017-05-28 ENCOUNTER — Ambulatory Visit: Payer: PPO

## 2017-05-28 DIAGNOSIS — M216X2 Other acquired deformities of left foot: Secondary | ICD-10-CM

## 2017-05-28 DIAGNOSIS — M2042 Other hammer toe(s) (acquired), left foot: Secondary | ICD-10-CM

## 2017-05-28 NOTE — Progress Notes (Signed)
DOS 06.08.2018   1. Metatarsal osteotomy 2nd and 3rd mets with internal screw fixation.   2. Hammertoe repair 2nd and 3rd toes with internal screw/pin left foot.

## 2017-06-03 ENCOUNTER — Ambulatory Visit (INDEPENDENT_AMBULATORY_CARE_PROVIDER_SITE_OTHER): Payer: PPO | Admitting: Podiatry

## 2017-06-03 ENCOUNTER — Encounter: Payer: Self-pay | Admitting: Podiatry

## 2017-06-03 ENCOUNTER — Ambulatory Visit: Payer: PPO

## 2017-06-03 DIAGNOSIS — M2042 Other hammer toe(s) (acquired), left foot: Secondary | ICD-10-CM

## 2017-06-03 DIAGNOSIS — M216X2 Other acquired deformities of left foot: Secondary | ICD-10-CM

## 2017-06-03 NOTE — Progress Notes (Signed)
She presents today for follow-up of her surgical foot left. Date of surgery was 05/07/2017. Osteotomy second left and hammertoe repair second and third left. K wires in place to the second third toes of the pin has extracted into the third toe and out of the second toe.  Vital signs are stable she's alert and oriented 3 no erythema edema cellulitis drainage or odor no purulence from the K wire tracts. No excessive pain on palpation or range of motion.  Assessment: Well-healing surgical foot.  Plan: Redressed a dresser compressive dressing follow-up with her in 2 weeks at which time we'll pull the pins.

## 2017-06-04 ENCOUNTER — Ambulatory Visit (INDEPENDENT_AMBULATORY_CARE_PROVIDER_SITE_OTHER): Payer: PPO | Admitting: Podiatry

## 2017-06-04 DIAGNOSIS — M2042 Other hammer toe(s) (acquired), left foot: Secondary | ICD-10-CM

## 2017-06-07 ENCOUNTER — Telehealth: Payer: Self-pay | Admitting: *Deleted

## 2017-06-07 ENCOUNTER — Ambulatory Visit (INDEPENDENT_AMBULATORY_CARE_PROVIDER_SITE_OTHER): Payer: PPO

## 2017-06-07 ENCOUNTER — Ambulatory Visit (INDEPENDENT_AMBULATORY_CARE_PROVIDER_SITE_OTHER): Payer: Self-pay | Admitting: Podiatry

## 2017-06-07 DIAGNOSIS — M2042 Other hammer toe(s) (acquired), left foot: Secondary | ICD-10-CM

## 2017-06-07 NOTE — Progress Notes (Signed)
Subjective: Laura Stuart is a 71 y.o. is seen today in office s/p 2nd and 3rd metatarsal osteotomies with hammertoe repair preformed on 05/07/2017 with Dr. Milinda Pointer. They state their pain is controlled. She's been in a surgical boot. She's been using the Darco digital splint as well. She states that the wire to the third toes, some that she has noticed a the appointment but denies hitting the. Denies any systemic complaints such as fevers, chills, nausea, vomiting. No calf pain, chest pain, shortness of breath.   Objective: General: No acute distress, AAOx3  DP/PT pulses palpable 2/4, CRT < 3 sec to all digits.  Protective sensation intact. Motor function intact.   LEFT foot: Incision is well coapted without any evidence of dehiscence and sutures are intact. There is no surrounding erythema, ascending cellulitis, fluctuance, crepitus, malodor, drainage/purulence. There is minimal edema around the surgical site. There is no pain along the surgical site. K wire intact the second and third toes however on the one toe the K wire has come out some. Is no drainage or pus in the K wire sites. The toe sits slightly deviated in the transverse plane however sitting flat and sagittal plane. No other areas of tenderness to bilateral lower extremities.  No other open lesions or pre-ulcerative lesions.  No pain with calf compression, swelling, warmth, erythema.   Assessment and Plan:  Status post left foot surgery, doing well with no complications   -Treatment options discussed including all alternatives, risks, and complications -X-rays were obtained and reviewed with the patient. The K wire are in position in both going to the MPJ in decent position. There is no evidence of acute fracture. -Sutures removed today without any complications. After removal the incision remained well coapted. Antibiotic ointment and bandages were applied. -Continue with offloading boot -Ice/elevation -Pain medication as  needed. -Monitor for any clinical signs or symptoms of infection and DVT/PE and directed to call the office immediately should any occur or go to the ER. -Follow-up on July 5th with [facility] or sooner if any problems arise. In the meantime, encouraged to call the office with any questions, concerns, change in symptoms.   Celesta Gentile, DPM

## 2017-06-07 NOTE — Telephone Encounter (Signed)
Pt states at the end of 2 weeks the pin started to come out, at week 4 on Saturday got up in the morning and stepped down and had terrible sharp pain, they pulled the pin back out to the extent it had been. Pt would like to know the signs of infection she would look for other than fever. I told pt she may see more localized symptoms like increase redness, swelling and drainage, before a generalized fever. I told pt I would inform the doctor-on-call and call with instructions. Dr. Amalia Hailey states get pt in to be evaluated.Left message on pt's home phone to call for an appt today. I spoke with pt and informed of Dr. Amalia Hailey orders, transferred to schedulers.

## 2017-06-07 NOTE — Progress Notes (Signed)
   Subjective:  Patient presents today status post hammertoe repair digits 2-3 of the left foot performed by Dr. Milinda Pointer. DOS: 05/07/2017  Patient states that over the past few days the percutaneous fixation pin to the second digit has been coming out of the toe. Today upon presentation the pin appears to be approximately 2 inches extending out of the second digit. Patient presents today for further treatment and evaluation    Objective/Physical Exam Skin incisions appear to be well coapted. No sign of infectious process noted. No dehiscence. No active bleeding noted. Moderate edema noted to the surgical extremity. The percutaneous fixation pin to the second digit of the left foot is distracted distally and almost completely removed from the foot. The pain is very loose in nature. The toe is somewhat elevated with medial deviation The percutaneous fixation pin to the third digit left foot is intact. The toes in a rectus position  Assessment: 1. s/p hammertoe repair digits 2-3 left foot. DOS: 05/07/2017   Plan of Care:  1. Patient was evaluated.  2. Today the decision was made to remove the percutaneous fixation. The second digit left foot. Pain was removed and dry sterile dressing was applied. A silicone toe spacer was applied between digits 1-2 of the left foot 3. The percutaneous fixation. The third digit left foot was left intact. 4. Continue immobilization cam boot 5. Follow-up on her next scheduled appointment with Dr. Milinda Pointer   Edrick Kins, DPM [facility]  Dr. Edrick Kins, [address]                                        [place], [place] [address]                Office [phone]  Fax [phone]

## 2017-06-16 NOTE — Progress Notes (Signed)
Patient presents stating that her bandage is uncomfortable.   Removed old bandage, noted well healing surgical sites. Redressed with clean dry dressing. Patient is to keep her follow up appt

## 2017-06-17 ENCOUNTER — Ambulatory Visit (INDEPENDENT_AMBULATORY_CARE_PROVIDER_SITE_OTHER): Payer: PPO

## 2017-06-17 ENCOUNTER — Ambulatory Visit: Payer: PPO | Admitting: Neurology

## 2017-06-17 ENCOUNTER — Ambulatory Visit (INDEPENDENT_AMBULATORY_CARE_PROVIDER_SITE_OTHER): Payer: PPO | Admitting: Podiatry

## 2017-06-17 DIAGNOSIS — M2042 Other hammer toe(s) (acquired), left foot: Secondary | ICD-10-CM

## 2017-06-17 DIAGNOSIS — M216X2 Other acquired deformities of left foot: Secondary | ICD-10-CM

## 2017-06-17 NOTE — Progress Notes (Signed)
She presents today for a stop visit regarding second metatarsal osteotomy hammertoe repair #2 and #3 of the left foot. She has not been wearing her night splint to the fourth toe.  Objective: Vital signs are stable she is alert and oriented 3 all incision sites heal uneventfully she has some contracture deformity at the second PIPJ because of scar contracture. Also she has a retained K wire to the third toe which was removed today without complications. We removed this after radiographs were taken confirming good position of the toe and arthrodesis of the PIPJ.  Assessment: Well-healing surgical foot contracted second because of noncompliance.  Plan: I encouraged her to utilize her night splint to hold both toes down and keep them plantarflex also encouraged her to utilize a Darco shoe for the next 2 weeks until we can get her back into a regular tennis shoe. I will follow-up with her in 2-3 weeks.

## 2017-06-29 ENCOUNTER — Encounter: Payer: Self-pay | Admitting: Neurology

## 2017-06-29 ENCOUNTER — Ambulatory Visit (INDEPENDENT_AMBULATORY_CARE_PROVIDER_SITE_OTHER): Payer: PPO | Admitting: Neurology

## 2017-06-29 VITALS — BP 115/70 | HR 72 | Ht 65.0 in | Wt 170.0 lb

## 2017-06-29 DIAGNOSIS — G4733 Obstructive sleep apnea (adult) (pediatric): Secondary | ICD-10-CM | POA: Diagnosis not present

## 2017-06-29 DIAGNOSIS — Z7189 Other specified counseling: Secondary | ICD-10-CM | POA: Diagnosis not present

## 2017-06-29 DIAGNOSIS — G500 Trigeminal neuralgia: Secondary | ICD-10-CM

## 2017-06-29 DIAGNOSIS — Z9989 Dependence on other enabling machines and devices: Secondary | ICD-10-CM | POA: Diagnosis not present

## 2017-06-29 MED ORDER — TEGRETOL 200 MG PO TABS: ORAL_TABLET | ORAL | 0 refills | Status: DC

## 2017-06-29 MED ORDER — ZOLPIDEM TARTRATE 10 MG PO TABS: 10.0000 mg | ORAL_TABLET | Freq: Every evening | ORAL | 5 refills | Status: DC | PRN

## 2017-06-29 NOTE — Progress Notes (Signed)
[facility]  PATIENT: Laura Stuart DOB: 1945/12/17   REASON FOR VISIT: Follow-up for trigeminal neuralgia, obstructive sleep apnea, questionable TIA HISTORY FROM: Patient Mr Vogler, a 71 year old female, presents here for CPAP compliance but no download is available. She brought her machine to [facility] just yesterday and was told the download would be here.  It wasn't our visit it short. Marland Kitchen   HISTORY OF PRESENT ILLNESS:UPDATE 04/09/2018CM  Laura Stuart, 71 year old female returns for follow-up. She has a history of trigeminal neuralgia and obstructive sleep apnea. Her Tegretol was increased at her last visit March 8th. She had hospital admission on 02/26/2017 for abnormal movements and weakness. She had been on a road trip with her family, diagnosed in an urgent care with strep throat started on antibiotics. Today she returned home she read in the car with a family member who had been diagnosed with the flu. The following morning she was extremely weak and could not walk and she had trouble speaking family brought her to the emergency room. MRI of the brain MRA of the brain no acute stroke seen. Mild chronic small vessel ischemic changes noted. 2-D echo ejection fraction was normal carotid Doppler unremarkable.  She remains on aspirin for secondary stroke prevention without further stroke or TIA symptoms. She has no bruising or bleeding. She remains on Lipitor  for hyperlipidemia. LDL 85. She denies any myalgias. She returns for reevaluation  Interval history from 02/04/2017,CD Laura Stuart reports that the trigeminal neuralgia has worsened and the area that  is affected is enlarged. It involves an area over the upper lip of the right side of the face as well as above the right eyebrow radiating slightly towards the temple not reaching the retroauricular region or the maxillary area. She was treated on Gabapentin  before she changed to Tegretol.  I will increase the dose. The  last  "  trigeminal attack"  was 12 years ago.   Social history- of [place] descent, 2 daughters - 4 grandchildren.   REVIEW OF SYSTEMS: Full 14 system review of systems performed and notable only for those listed, all others are neg:  Fatigue severity scale was endorsed at 12 points, Epworth Sleepiness Scale at only 2 points, the geriatric depression score at only 1 out of 15 points.  ALLERGIES: Allergies  Allergen Reactions  . Penicillins Other (See Comments)    From allergy testing, unsure if allergy exists    HOME MEDICATIONS: Outpatient Medications Prior to Visit  Medication Sig Dispense Refill  . aspirin EC 81 MG EC tablet Take 1 tablet (81 mg total) by mouth daily. 30 tablet 0  . atorvastatin (LIPITOR) 10 MG tablet Take 1 tablet (10 mg total) by mouth daily at 6 PM. 90 tablet 1  . Calcium Carb-Cholecalciferol (CALCIUM CARBONATE-VITAMIN D3 PO) Take 1 tablet by mouth daily.     . TEGRETOL 200 MG tablet Take 200 in AM and 200 mg at lunch time, separate prescription provided for 400 mg at bedtime 60 tablet 0  . zolpidem (AMBIEN) 10 MG tablet Take 1 tablet (10 mg total) by mouth at bedtime as needed for sleep. 30 tablet 5  . Halcinonide (HALOG) 0.1 % CREA Apply 1 application topically daily as needed (sparingly for itching).     Marland Kitchen HYDROmorphone (DILAUDID) 4 MG tablet Take 1 tablet (4 mg total) by mouth every 4 (four) hours as needed for severe pain. 30 tablet 0  . ondansetron (ZOFRAN) 4 MG tablet Take 1 tablet (  4 mg total) by mouth every 8 (eight) hours as needed. 20 tablet 0   No facility-administered medications prior to visit.     PAST MEDICAL HISTORY: Past Medical History:  Diagnosis Date  . Allergy   . Atypical facial pain   . Neuralgia    Trigeminal  . Sleep apnea    using cpap,OSA  . Trigeminal neuralgia     PAST SURGICAL HISTORY: Past Surgical History:  Procedure Laterality Date  . ROOT CANAL  06/2013  . toes right foot  2010  . TONSILLECTOMY AND ADENOIDECTOMY  1950  .  torn miniscus  2003  . VAGINAL DELIVERY     X2    FAMILY HISTORY: Family History  Problem Relation Age of Onset  . Stroke Mother   . Brain cancer Mother   . Lung cancer Mother   . Heart attack Mother   . Emphysema Father   . Alzheimer's disease Sister   . Dementia Other        evident after a fall with a hip fracture  . Colon cancer Neg Hx     SOCIAL HISTORY: Social History   Social History  . Marital status: Married    Spouse name: [name]  . Number of children: 2  . Years of education: Grad sch.   Occupational History  .      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 Smoking status: Never Smoker  . Smokeless tobacco: Never Used  . Alcohol use 0.0 oz/week     Comment: 1 glass every 2 months; socially  . Drug use: No  . Sexual activity: Not on file   Other Topics Concern  . Not on file   Social History Narrative   Patient is married [name]) and lives at home with her husband.   Patient has two adult children.   Patient is retired.   Patient has a Production designer, theatre/television/film.   Patient is right-handed.   Patient drinks 1 1/2 cups of coffee daily.     PHYSICAL EXAM  Vitals:   06/29/17 0922  BP: 115/70  Pulse: 72  Weight: 170 lb (77.1 kg)  Height: 5\' 5"  (1.651 m)   Body mass index is 28.29 kg/m.  Generalized: Well developed, in no acute distress  Head: normocephalic and atraumatic,. Oropharynx benign  Neck: Supple, no carotid bruits  Cardiac: Regular rate rhythm, no murmur  Musculoskeletal: No deformity   Neurological examination   Mentation: Alert oriented to time, place, history taking. Attention span and concentration appropriate. Recent and remote memory intact.  Follows all commands speech and language fluent.  Cranial nerve: Taste and smell preserved, Pupils were equal round reactive to light extraocular movements were full, visual field were full on confrontational test.  Facial sensation and strength were normal. hearing was intact to finger rubbing bilaterally. Uvula and tongue in midline.Tongue protrusion into cheek  was normal. Motor:  bulk and tone are symmetric - fine finger movements normal, no pronator drift. No focal weakness Sensory: normal and symmetric to light touch,In the upper and lower extremities Coordination: finger-nose-finger intact Reflexes: 1+ upper lower and symmetric, plantar responses were flexor bilaterally. Gait and Station: Rising up from seated position without assistance, good arm swing, smooth turning, able to perform tiptoe, and heel walking without difficulty. Tandem gait is steady  DIAGNOSTIC DATA (LABS, IMAGING, TESTING) - I reviewed patient records, labs, notes, testing and imaging myself where  available.  Lab Results  Component Value Date   WBC 8.0 02/27/2017   HGB 10.0 (L) 02/27/2017   HCT 31.3 (L) 02/27/2017   MCV 89.7 02/27/2017   PLT 165 02/27/2017      Component Value Date/Time   NA 147 (H) 03/08/2017 0930   K 5.3 (H) 03/08/2017 0930   CL 105 03/08/2017 0930   CO2 27 03/08/2017 0930   GLUCOSE 83 03/08/2017 0930   GLUCOSE 100 (H) 02/27/2017 0514   BUN 17 03/08/2017 0930   CREATININE 0.85 03/08/2017 0930   CREATININE 0.79 02/05/2013 1117   CALCIUM 9.9 03/08/2017 0930   PROT 6.0 (L) 02/26/2017 1450   PROT 6.2 12/28/2016 0939   ALBUMIN 3.2 (L) 02/26/2017 1450   ALBUMIN 4.0 12/28/2016 0939   AST 63 (H) 02/26/2017 1450   ALT 78 (H) 02/26/2017 1450   ALKPHOS 76 02/26/2017 1450   BILITOT 0.3 02/26/2017 1450   BILITOT <0.2 12/28/2016 0939   GFRNONAA 70 03/08/2017 0930   GFRAA 80 03/08/2017 0930   Lab Results  Component Value Date   CHOL 162 02/27/2017   HDL 59 02/27/2017   LDLCALC 85 02/27/2017   LDLDIRECT 106.9 05/22/2013   TRIG 89 02/27/2017   CHOLHDL 2.7 02/27/2017   Lab Results  Component Value Date   HGBA1C 5.5 02/27/2017    Lab Results  Component Value Date   TSH 2.83 08/20/2016       ASSESSMENT AND PLAN  71 y.o. year old female  has a past medical history of Allergy; Atypical facial pain; Neuralgia; Sleep apnea; and Trigeminal neuralgia.  Recent hospital admission for abnormal movements and weakness. MRI of the brain and MRA were negative. Diagnosed as a TIA, she still has some gait trouble- Had hammertoe surgery.     Will re-check Na level today due to increase in Tegretol to monitor for side effects Stressed the importance of management of risk factors to prevent further stroke, HTN, hydration, dietary changes. .  Continue aspirin  for secondary stroke prevention Exercise by walking, slowly increase , eat healthy diet with whole grains,  fresh fruits and vegetables Continue CPAP as directed for obstructive sleep apnea- she had a 19 day hiatus in CPAP use, surgery related and during travel to [place].   Follow up 12 months Discussed risk for recurrent stroke/ TIA and answered additional questions.  This was a visit of 15 minutes  with extensive review of history, counseling and answering questions   Larey Seat, MD   [facility] [address] [place], [place] [address] [phone]

## 2017-06-29 NOTE — Patient Instructions (Signed)
Carbamazepine tablets What is this medicine? CARBAMAZEPINE (kar ba MAZ e peen) is used to control seizures caused by certain types of epilepsy. This medicine is also used to treat nerve related pain. It is not for common aches and pains. This medicine may be used for other purposes; ask your health care provider or pharmacist if you have questions. COMMON BRAND NAME(S): Epitol, Tegretol What should I tell my health care provider before I take this medicine? They need to know if you have any of these conditions: -Asian ancestry -bone marrow disease -glaucoma -heart disease or irregular heartbeat -kidney disease -liver disease -low blood counts, like low white cell, platelet, or red cell counts -porphyria -psychotic disorders -suicidal thoughts, plans, or attempt; a previous suicide attempt by you or a family member -an unusual or allergic reaction to carbamazepine, tricyclic antidepressants, phenytoin, phenobarbital or other medicines, foods, dyes, or preservatives -pregnant or trying to get pregnant -breast-feeding How should I use this medicine? Take this medicine by mouth with a glass of water. Follow the directions on the prescription label. Take this medicine with food. Take your doses at regular intervals. Do not take your medicine more often than directed. Do not stop taking this medicine except on the advice of your doctor or health care professional. A special MedGuide will be given to you by the pharmacist with each prescription and refill. Be sure to read this information carefully each time. Talk to your pediatrician regarding the use of this medicine in children. Special care may be needed. Overdosage: If you think you have taken too much of this medicine contact a poison control center or emergency room at once. NOTE: This medicine is only for you. Do not share this medicine with others. What if I miss a dose? If you miss a dose, take it as soon as you can. If it is almost  time for your next dose, take only that dose. Do not take double or extra doses. What may interact with this medicine? Do not take this medicine with any of the following medications: -certain medicines used to treat HIV infection or AIDS that are given in combination with cobicistat - delavirdine - MAOIs like Carbex, Eldepryl, Marplan, Nardil, and Parnate - nefazodone - oxcarbazepine This medicine may also interact with the following medications: - acetaminophen - acetazolamide - barbiturate medicines for inducing sleep or treating seizures, like phenobarbital - certain antibiotics like clarithromycin, erythromycin or troleandomycin - cimetidine - cyclosporine - danazol - dicumarol - doxycycline - female hormones, including estrogens and birth control pills - grapefruit juice - isoniazid, INH - levothyroxine and other thyroid hormones - lithium and other medicines to treat mood problems or psychotic disturbances - loratadine - medicines for angina or high blood pressure - medicines for cancer - medicines for depression or anxiety - medicines for sleep - medicines to treat fungal infections, like fluconazole, itraconazole or ketoconazole - medicines used to treat HIV infection or AIDS - methadone - niacinamide - praziquantel - propoxyphene - rifampin or rifabutin - seizure or epilepsy medicine - steroid medicines such as prednisone or cortisone - theophylline - tramadol - warfarin This list may not describe all possible interactions. Give your health care provider a list of all the medicines, herbs, non-prescription drugs, or dietary supplements you use. Also tell them if you smoke, drink alcohol, or use illegal drugs. Some items may interact with your medicine. What should I watch for while using this medicine? Visit your doctor or health care professional for a regular check  on your progress. Do not change brands or dosage forms of this  medicine without discussing the change with your doctor or health care professional. If you are taking this medicine for epilepsy (seizures) do not stop taking it suddenly. This increases the risk of seizures. Wear a Medic Alert bracelet or necklace. Carry an identification card with information about your condition, medications, and doctor or health care professional. You may get drowsy, dizzy, or have blurred vision. Do not drive, use machinery, or do anything that needs mental alertness until you know how this medicine affects you. To reduce dizzy or fainting spells, do not sit or stand up quickly, especially if you are an older patient. Alcohol can increase drowsiness and dizziness. Avoid alcoholic drinks. Birth control pills may not work properly while you are taking this medicine. Talk to your doctor about using an extra method of birth control. This medicine can make you more sensitive to the sun. Keep out of the sun. If you cannot avoid being in the sun, wear protective clothing and use sunscreen. Do not use sun lamps or tanning beds/booths. The use of this medicine may increase the chance of suicidal thoughts or actions. Pay special attention to how you are responding while on this medicine. Any worsening of mood, or thoughts of suicide or dying should be reported to your health care professional right away. Women who become pregnant while using this medicine may enroll in the [place] American Antiepileptic Drug Pregnancy Registry by calling 1-888-233-2334. This registry collects information about the safety of antiepileptic drug use during pregnancy. What side effects may I notice from receiving this medicine? Side effects that you should report to your doctor or health care professional as soon as possible: -allergic reactions like skin rash, itching or hives, swelling of the face, lips, or tongue -breathing problems -changes in vision -confusion -dark urine -fast or irregular heartbeat -fever  or chills, sore throat -mouth ulcers -pain or difficulty passing urine -redness, blistering, peeling or loosening of the skin, including inside the mouth -ringing in the ears -seizures -stomach pain -swollen joints or muscle/joint aches and pains -unusual bleeding or bruising -unusually weak or tired -vomiting -worsening of mood, thoughts or actions of suicide or dying -yellowing of the eyes or skin Side effects that usually do not require medical attention (report to your doctor or health care professional if they continue or are bothersome): -clumsiness or unsteadiness -diarrhea or constipation -headache -increased sweating -nausea This list may not describe all possible side effects. Call your doctor for medical advice about side effects. You may report side effects to FDA at 1-800-FDA-1088. Where should I keep my medicine? Keep out of reach of children. Store at room temperature below 30 degrees C (86 degrees F). Keep container tightly closed. Protect from moisture. Throw away any unused medicine after the expiration date. NOTE: This sheet is a summary. It may not cover all possible information. If you have questions about this medicine, talk to your doctor, pharmacist, or health care provider.  2018 Elsevier/Gold Standard (2014-11-08 15:38:34)  

## 2017-06-30 ENCOUNTER — Telehealth: Payer: Self-pay | Admitting: Neurology

## 2017-06-30 LAB — COMPREHENSIVE METABOLIC PANEL
ALT: 14 IU/L (ref 0–32)
AST: 20 IU/L (ref 0–40)
Albumin/Globulin Ratio: 1.9 (ref 1.2–2.2)
Albumin: 4.2 g/dL (ref 3.5–4.8)
Alkaline Phosphatase: 94 IU/L (ref 39–117)
BUN/Creatinine Ratio: 24 (ref 12–28)
BUN: 19 mg/dL (ref 8–27)
Bilirubin Total: <0.2 mg/dL (ref 0.0–1.2)
CALCIUM: 9.6 mg/dL (ref 8.7–10.3)
CHLORIDE: 105 mmol/L (ref 96–106)
CO2: 27 mmol/L (ref 20–29)
Creatinine, Ser: 0.78 mg/dL (ref 0.57–1.00)
GFR calc Af Amer: 88 mL/min/{1.73_m2} (ref >59)
GFR, EST NON AFRICAN AMERICAN: 77 mL/min/{1.73_m2} (ref >59)
GLUCOSE: 92 mg/dL (ref 65–99)
Globulin, Total: 2.2 g/dL (ref 1.5–4.5)
POTASSIUM: 5.1 mmol/L (ref 3.5–5.2)
Sodium: 144 mmol/L (ref 134–144)
Total Protein: 6.4 g/dL (ref 6.0–8.5)

## 2017-06-30 NOTE — Telephone Encounter (Signed)
-----   Message from Larey Seat, MD sent at 06/30/2017  4:45 PM EDT ----- Normal CMET-

## 2017-06-30 NOTE — Telephone Encounter (Signed)
Called and discussed the lab results with the patient stating that they were normal and Dr Dohmeier didnt have any concerns. Pt verbalized understanding. No further questions at this time.

## 2017-07-01 ENCOUNTER — Ambulatory Visit: Payer: PPO | Admitting: Nurse Practitioner

## 2017-07-13 ENCOUNTER — Ambulatory Visit: Payer: PPO | Admitting: Podiatry

## 2017-07-13 ENCOUNTER — Other Ambulatory Visit: Payer: Self-pay | Admitting: Neurology

## 2017-07-13 ENCOUNTER — Ambulatory Visit: Payer: PPO

## 2017-07-13 ENCOUNTER — Telehealth: Payer: Self-pay | Admitting: Neurology

## 2017-07-13 MED ORDER — CARBAMAZEPINE 200 MG PO TABS: 200.0000 mg | ORAL_TABLET | Freq: Four times a day (QID) | ORAL | 0 refills | Status: DC

## 2017-07-13 NOTE — Telephone Encounter (Signed)
[facility]ed [facility]ts [facility], pharmacist says they will have it ready.

## 2017-07-13 NOTE — Telephone Encounter (Signed)
Pt called back said the qty was not correct, she takes 200mg  in am and 200mg  at lunch and 400mg  at bedtime. Last RX was written with separate script for 400mg . Please call to discuss.

## 2017-07-13 NOTE — Telephone Encounter (Signed)
[facility] [place] has called to inform that [place] called in the Tegretol for 2 a day, Pt actually takes 1 in am 1 at lunch and 2 at night , please call the pharmacy back to correct

## 2017-07-13 NOTE — Telephone Encounter (Signed)
Pt out of town without her TEGRETOL 200 MG tablet pt would like to know if a 4 day supply could be called [facility] a [facility] [place], [place] [phone], please call pt

## 2017-07-20 ENCOUNTER — Ambulatory Visit (INDEPENDENT_AMBULATORY_CARE_PROVIDER_SITE_OTHER): Payer: PPO | Admitting: Podiatry

## 2017-07-20 ENCOUNTER — Ambulatory Visit (INDEPENDENT_AMBULATORY_CARE_PROVIDER_SITE_OTHER): Payer: PPO

## 2017-07-20 DIAGNOSIS — M2042 Other hammer toe(s) (acquired), left foot: Secondary | ICD-10-CM

## 2017-07-20 DIAGNOSIS — M216X2 Other acquired deformities of left foot: Secondary | ICD-10-CM

## 2017-07-20 NOTE — Progress Notes (Signed)
She presents today date of surgery 05/07/2017 status post hammertoe repair #2 and #3 of the left foot. She states that they're doing very well.  Objective: Vital signs are stable she is alert and oriented 3. Pulses are palpable. Neurologic sensorium is intact. Deep tendon reflexes are intact. Toes appear to be rectus and healing normally. Radiographs taken today demonstrate well-placed screws and arthrodesis sites to the toes.  Assessment: Well-healing surgical foot.  Plan: Follow up with me as needed.

## 2017-08-03 ENCOUNTER — Ambulatory Visit: Payer: Self-pay | Admitting: Neurology

## 2017-08-11 ENCOUNTER — Telehealth: Payer: Self-pay

## 2017-08-11 NOTE — Telephone Encounter (Signed)
LM requesting call back regarding AWV. Requesting pt to complete AWV with Health Coach on 08/24/17 @ 0900 prior to appointment with PCP.

## 2017-08-17 ENCOUNTER — Ambulatory Visit: Payer: PPO

## 2017-08-17 ENCOUNTER — Ambulatory Visit (INDEPENDENT_AMBULATORY_CARE_PROVIDER_SITE_OTHER): Payer: PPO | Admitting: Podiatry

## 2017-08-17 ENCOUNTER — Ambulatory Visit: Payer: PPO | Admitting: Podiatry

## 2017-08-17 ENCOUNTER — Encounter: Payer: Self-pay | Admitting: Podiatry

## 2017-08-17 DIAGNOSIS — M2042 Other hammer toe(s) (acquired), left foot: Secondary | ICD-10-CM

## 2017-08-17 DIAGNOSIS — M216X2 Other acquired deformities of left foot: Secondary | ICD-10-CM | POA: Diagnosis not present

## 2017-08-17 NOTE — Progress Notes (Signed)
She presents today date of surgery 05/07/2017 seconds metatarsal osteotomy and hammertoe repair #2 #3 left foot. She states it is still a bit swollen and numb in the toes.  Objective: Vital signs are stable she is alert and oriented 3 there has some numbness and tingling on palpation to the second and third digits of the left foot. The second toe appears to be sitting rectus with lateral deviation of the third toe minimally. She has some tenderness on palpation of the tibial tendon but minimally so.  Assessment: Well-healing surgical foot.  Plan: Follow-up with [place] on an as-needed basis.

## 2017-08-23 NOTE — Progress Notes (Signed)
Subjective:   Laura Stuart is a 71 y.o. female who presents for Medicare Annual (Subsequent) preventive examination.  Review of Systems:  No ROS.  Medicare Wellness Visit. Additional risk factors are reflected in the social history.  Cardiac Risk Factors include: family history of premature cardiovascular disease;advanced age (>17men, >4 women)   Sleep patterns: Sleeps 8-9 hours, uses CPAP.  Home Safety/Smoke Alarms: Feels safe in home. Smoke alarms in place.  Living environment; residence and Firearm Safety: Lives with husband in 2 story home.  Seat Belt Safety/Bike Helmet: Wears seat belt.   Female:   [id number]      Mammo-09/08/2016, Negative. Ordered today, [facility].      Dexa scan-09/03/2015, Osteopenia. Ordered today, [facility].     CCS-Colonoscopy 12/04/2013, polyps. Recall 5 years.      Objective:     Vitals: BP 118/70 (BP Location: Left Arm, Patient Position: Sitting, Cuff Size: Normal)   Pulse 72   Temp 98.1 F (36.7 C) (Temporal)   Resp 18   Ht 5\' 5"  (1.651 m)   Wt 174 lb 9.6 oz (79.2 kg)   BMI 29.05 kg/m   Body mass index is 29.05 kg/m.   Tobacco History  Smoking Status  . Never Smoker  Smokeless Tobacco  . Never Used     Counseling given: Not Answered   Past Medical History:  Diagnosis Date  . Allergy   . Atypical facial pain   . Neuralgia    Trigeminal  . Sleep apnea    using cpap,[facility]  . Trigeminal neuralgia    Past Surgical History:  Procedure Laterality Date  . FOOT SURGERY Left 05/07/2017  . ROOT CANAL  06/2013  . toes right foot  2010  . TONSILLECTOMY AND ADENOIDECTOMY  1950  . torn miniscus  2003  . VAGINAL DELIVERY     X2   Family History  Problem Relation Age of Onset  . Stroke Mother   . Brain cancer Mother   . Lung cancer Mother   . Heart attack Mother   . Emphysema Father   . Alzheimer's disease Sister   . Dementia Other        evident after a fall with a hip fracture  . Colon cancer Neg Hx    History  Sexual  Activity  . Sexual activity: Not on file    Outpatient Encounter Prescriptions as of 08/24/2017  Medication Sig  . atorvastatin (LIPITOR) 10 MG tablet Take 1 tablet (10 mg total) by mouth daily at 6 PM.  . Calcium Carb-Cholecalciferol (CALCIUM CARBONATE-VITAMIN D3 PO) Take 1 tablet by mouth daily.   . carbamazepine (TEGRETOL) 200 MG tablet Take 1 tablet (200 mg total) by mouth QID. Take 1 tab breakfast, 1 tab at lunch and then 2 at bedtime  . zolpidem (AMBIEN) 10 MG tablet Take 1 tablet (10 mg total) by mouth at bedtime as needed for sleep.  Marland Kitchen aspirin EC 81 MG EC tablet Take 1 tablet (81 mg total) by mouth daily. (Patient not taking: Reported on 08/24/2017)   No facility-administered encounter medications on file as of 08/24/2017.     Activities of Daily Living In your present state of health, do you have any difficulty performing the following activities: 08/24/2017 03/12/2017  Hearing? N N  Vision? N N  Difficulty concentrating or making decisions? N N  Walking or climbing stairs? N N  Dressing or bathing? N N  Doing errands, shopping? N N  Preparing Food and eating ? N -  Using the Toilet? N -  In the past six months, have you accidently leaked urine? N -  Do you have problems with loss of bowel control? N -  Managing your Medications? N -  Managing your Finances? N -  Housekeeping or managing your Housekeeping? N -  Some recent data might be hidden    Patient Care Team: Midge Minium, MD as PCP - General (Family Medicine) Dohmeier, Asencion Partridge, MD as Consulting Physician (Neurology) Irene Shipper, MD as Consulting Physician (Gastroenterology) Garrel Ridgel, DPM as Consulting Physician (Podiatry)    Assessment:    Physical assessment deferred to PCP.  Exercise Activities and Dietary recommendations Current Exercise Habits: Structured exercise class, Type of exercise: strength training/weights;Other - see comments (aerobics), Frequency (Times/Week): 4, Exercise limited by:  None identified   Diet (meal preparation, eat out, water intake, caffeinated beverages, dairy products, fruits and vegetables): Drinks coffee, water and hot tea.   Breakfast: english muffin, eggs; cereal/fruit; oatmeal/nuts, fruit Lunch: bagel; croissant; sandwich Dinner: salad; vegetables; rice dishes; protein  Goals    . patient          Maintain current health by staying active.       Fall Risk Fall Risk  08/24/2017 03/12/2017 08/20/2016 07/30/2016 08/20/2015  Falls in the past year? No No Yes Yes No  Number falls in past yr: - - 2 or more 2 or more -  Injury with Fall? - - No No -  Comment - - - bruises -  Risk for fall due to : - - Other (Comment) Other (Comment) -  Risk for fall due to: Comment - - tripped trip over my feet or something on ground -   Depression Screen PHQ 2/9 Scores 08/24/2017 03/12/2017 08/20/2016 08/20/2015  PHQ - 2 Score 0 0 0 0  PHQ- 9 Score - 0 - -     Cognitive Function   Montreal Cognitive Assessment  12/28/2016  Visuospatial/ Executive (0/5) 5  Naming (0/3) 3  Attention: Read list of digits (0/2) 2  Attention: Read list of letters (0/1) 1  Attention: Serial 7 subtraction starting at 100 (0/3) 3  Language: Repeat phrase (0/2) 2  Language : Fluency (0/1) 1  Abstraction (0/2) 2  Delayed Recall (0/5) 4  Orientation (0/6) 6  Total 29  Adjusted Score (based on education) 29     Ad8 score reviewed for issues:  Issues making decisions: no  Less interest in hobbies / activities: no  Repeats questions, stories (family complaining): no  Trouble using ordinary gadgets (microwave, computer, phone): no  Forgets the month or year: no  Mismanaging finances: no  Remembering appts: no  Daily problems with thinking and/or memory: no Ad8 score is=0     Immunization History  Administered Date(s) Administered  . Hepatitis A 04/20/2011  . Influenza Split 10/29/2011  . Influenza, High Dose Seasonal PF 08/24/2017  . Influenza,inj,Quad PF,6+ Mos  08/14/2014, 08/20/2015, 08/06/2016  . Pneumococcal Conjugate-13 08/20/2015  . Pneumococcal Polysaccharide-23 05/22/2013  . Typhoid Parenteral 04/20/2011  . Zoster 11/30/2004   Screening Tests Health Maintenance  Topic Date Due  . INFLUENZA VACCINE  06/30/2017  . TETANUS/TDAP  08/30/2017 (Originally 04/27/1965)  . Hepatitis C Screening  08/30/2017 (Originally 1946-04-20)  . DEXA SCAN  09/02/2017  . MAMMOGRAM  09/08/2017  . COLONOSCOPY  12/04/2018  . PNA vac Low Risk Adult  Completed   Mammo and DEXA ordered today. High Dose Influenza administered today.  Declines Hep C screening  Plan:    Schedule mammogram and bone scan after 09/09/2017.   Bring a copy of your living will and/or healthcare power of attorney to your next office visit.  Continue doing brain stimulating activities (puzzles, reading, adult coloring books, staying active) to keep memory sharp.    I have personally reviewed and noted the following in the patient's chart:   . Medical and social history . Use of alcohol, tobacco or illicit drugs  . Current medications and supplements . Functional ability and status . Nutritional status . Physical activity . Advanced directives . List of other physicians . Hospitalizations, surgeries, and ER visits in previous 12 months . Vitals . Screenings to include cognitive, depression, and falls . Referrals and appointments  In addition, I have reviewed and discussed with patient certain preventive protocols, quality metrics, and best practice recommendations. A written personalized care plan for preventive services as well as general preventive health recommendations were provided to patient.     Gerilyn Nestle, RN  08/24/2017  Reviewed documentation provided by RN and agree w/ above.  [name], MD

## 2017-08-24 ENCOUNTER — Ambulatory Visit (INDEPENDENT_AMBULATORY_CARE_PROVIDER_SITE_OTHER): Payer: PPO | Admitting: Family Medicine

## 2017-08-24 ENCOUNTER — Ambulatory Visit: Payer: PPO

## 2017-08-24 ENCOUNTER — Encounter: Payer: Self-pay | Admitting: Family Medicine

## 2017-08-24 VITALS — BP 118/70 | HR 72 | Temp 98.1°F | Resp 18 | Ht 65.0 in | Wt 174.6 lb

## 2017-08-24 DIAGNOSIS — E559 Vitamin D deficiency, unspecified: Secondary | ICD-10-CM | POA: Diagnosis not present

## 2017-08-24 DIAGNOSIS — Z23 Encounter for immunization: Secondary | ICD-10-CM

## 2017-08-24 DIAGNOSIS — E785 Hyperlipidemia, unspecified: Secondary | ICD-10-CM

## 2017-08-24 DIAGNOSIS — E2839 Other primary ovarian failure: Secondary | ICD-10-CM | POA: Diagnosis not present

## 2017-08-24 DIAGNOSIS — Z1239 Encounter for other screening for malignant neoplasm of breast: Secondary | ICD-10-CM

## 2017-08-24 DIAGNOSIS — Z0000 Encounter for general adult medical examination without abnormal findings: Secondary | ICD-10-CM | POA: Diagnosis not present

## 2017-08-24 DIAGNOSIS — Z1231 Encounter for screening mammogram for malignant neoplasm of breast: Secondary | ICD-10-CM | POA: Diagnosis not present

## 2017-08-24 HISTORY — DX: Hyperlipidemia, unspecified: E78.5

## 2017-08-24 LAB — TSH: TSH: 2.45 u[IU]/mL (ref 0.35–4.50)

## 2017-08-24 LAB — BASIC METABOLIC PANEL
BUN: 16 mg/dL (ref 6–23)
CALCIUM: 9.7 mg/dL (ref 8.4–10.5)
CO2: 31 meq/L (ref 19–32)
CREATININE: 0.78 mg/dL (ref 0.40–1.20)
Chloride: 104 mEq/L (ref 96–112)
GFR: 77.31 mL/min (ref >60.00)
GLUCOSE: 99 mg/dL (ref 70–99)
Potassium: 4.7 mEq/L (ref 3.5–5.1)
SODIUM: 139 meq/L (ref 135–145)

## 2017-08-24 LAB — HEPATIC FUNCTION PANEL
ALT: 15 U/L (ref 0–35)
AST: 21 U/L (ref 0–37)
Albumin: 4.1 g/dL (ref 3.5–5.2)
Alkaline Phosphatase: 98 U/L (ref 39–117)
Bilirubin, Direct: 0.1 mg/dL (ref 0.0–0.3)
TOTAL PROTEIN: 6.6 g/dL (ref 6.0–8.3)
Total Bilirubin: 0.4 mg/dL (ref 0.2–1.2)

## 2017-08-24 LAB — CBC WITH DIFFERENTIAL/PLATELET
BASOS ABS: 0 10*3/uL (ref 0.0–0.1)
Basophils Relative: 1 % (ref 0.0–3.0)
EOS ABS: 0.1 10*3/uL (ref 0.0–0.7)
Eosinophils Relative: 1.4 % (ref 0.0–5.0)
HEMATOCRIT: 37.3 % (ref 36.0–46.0)
HEMOGLOBIN: 12.4 g/dL (ref 12.0–15.0)
LYMPHS PCT: 20.3 % (ref 12.0–46.0)
Lymphs Abs: 0.9 10*3/uL (ref 0.7–4.0)
MCHC: 33.1 g/dL (ref 30.0–36.0)
MCV: 91.6 fl (ref 78.0–100.0)
MONOS PCT: 9.7 % (ref 3.0–12.0)
Monocytes Absolute: 0.4 10*3/uL (ref 0.1–1.0)
Neutro Abs: 3 10*3/uL (ref 1.4–7.7)
Neutrophils Relative %: 67.6 % (ref 43.0–77.0)
Platelets: 234 10*3/uL (ref 150.0–400.0)
RBC: 4.07 Mil/uL (ref 3.87–5.11)
RDW: 14.8 % (ref 11.5–15.5)
WBC: 4.5 10*3/uL (ref 4.0–10.5)

## 2017-08-24 LAB — LIPID PANEL
CHOLESTEROL: 185 mg/dL (ref 0–200)
HDL: 70.3 mg/dL (ref >39.00)
LDL CALC: 98 mg/dL (ref 0–99)
NONHDL: 114.24
Total CHOL/HDL Ratio: 3
Triglycerides: 81 mg/dL (ref 0.0–149.0)
VLDL: 16.2 mg/dL (ref 0.0–40.0)

## 2017-08-24 LAB — VITAMIN D 25 HYDROXY (VIT D DEFICIENCY, FRACTURES): VITD: 27.62 ng/mL — AB (ref 30.00–100.00)

## 2017-08-24 NOTE — Assessment & Plan Note (Signed)
Chronic problem.  Tolerating statin w/o difficulty.  Check labs.  Adjust meds prn  

## 2017-08-24 NOTE — Progress Notes (Signed)
   Subjective:    Patient ID: Laura Stuart, female    DOB: 1945-12-30, 71 y.o.   MRN: [id number]  HPI CPE- UTD on mammo, colonoscopy, DEXA (mammo and DEXA ordered).  Received flu shot.  UTD on pneumonia vaccines.     Review of Systems Patient reports no hearing changes, adenopathy,fever, weight change,  persistant/recurrent hoarseness , swallowing issues, chest pain, palpitations, edema, persistant/recurrent cough, hemoptysis, dyspnea (rest/exertional/paroxysmal nocturnal), gastrointestinal bleeding (melena, rectal bleeding), abdominal pain, significant heartburn, bowel changes, GU symptoms (dysuria, hematuria, incontinence), Gyn symptoms (abnormal  bleeding, pain),  syncope, focal weakness, memory loss, numbness & tingling, skin/hair/nail changes, abnormal bruising or bleeding, anxiety, or depression.   + vision issues- seeing 2 different doctors    Objective:   Physical Exam General Appearance:    Alert, cooperative, no distress, appears stated age  Head:    Normocephalic, without obvious abnormality, atraumatic  Eyes:    PERRL, conjunctiva/corneas clear, EOM's intact, fundi    benign, both eyes  Ears:    Normal TM's and external ear canals, both ears  Nose:   Nares normal, septum midline, mucosa normal, no drainage    or sinus tenderness  Throat:   Lips, mucosa, and tongue normal; teeth and gums normal  Neck:   Supple, symmetrical, trachea midline, no adenopathy;    Thyroid: no enlargement/tenderness/nodules  Back:     Symmetric, no curvature, ROM normal, no CVA tenderness  Lungs:     Clear to auscultation bilaterally, respirations unlabored  Chest Wall:    No tenderness or deformity   Heart:    Regular rate and rhythm, S1 and S2 normal, no murmur, rub   or gallop  Breast Exam:    Deferred to mammo  Abdomen:     Soft, non-tender, bowel sounds active all four quadrants,    no masses, no organomegaly  Genitalia:    Deferred  Rectal:    Extremities:   Extremities normal,  atraumatic, no cyanosis or edema  Pulses:   2+ and symmetric all extremities  Skin:   Skin color, texture, turgor normal, no rashes or lesions  Lymph nodes:   Cervical, supraclavicular, and axillary nodes normal  Neurologic:   CNII-XII intact, normal strength, sensation and reflexes    throughout          Assessment & Plan:

## 2017-08-24 NOTE — Assessment & Plan Note (Signed)
Check labs.  Replete prn. 

## 2017-08-24 NOTE — Patient Instructions (Addendum)
Follow up in 6 months to recheck cholesterol We'll notify you of your lab results and make any changes if needed Keep up the good work on healthy diet and regular exercise- you look great! Call with any questions or concerns Happy Fall!!!  Schedule mammogram and bone scan after 09/09/2017.   Bring a copy of your living will and/or healthcare power of attorney to your next office visit.  Continue doing brain stimulating activities (puzzles, reading, adult coloring books, staying active) to keep memory sharp.   Health Maintenance, Female Adopting a healthy lifestyle and getting preventive care can go a long way to promote health and wellness. Talk with your health care provider about what schedule of regular examinations is right for you. This is a good chance for you to check in with your provider about disease prevention and staying healthy. In between checkups, there are plenty of things you can do on your own. Experts have done a lot of research about which lifestyle changes and preventive measures are most likely to keep you healthy. Ask your health care provider for more information. Weight and diet Eat a healthy diet  Be sure to include plenty of vegetables, fruits, low-fat dairy products, and lean protein.  Do not eat a lot of foods high in solid fats, added sugars, or salt.  Get regular exercise. This is one of the most important things you can do for your health. ? Most adults should exercise for at least 150 minutes each week. The exercise should increase your heart rate and make you sweat (moderate-intensity exercise). ? Most adults should also do strengthening exercises at least twice a week. This is in addition to the moderate-intensity exercise.  Maintain a healthy weight  Body mass index (BMI) is a measurement that can be used to identify possible weight problems. It estimates body fat based on height and weight. Your health care provider can help determine your BMI and help  you achieve or maintain a healthy weight.  For females 20 years of age and older: ? A BMI below 18.5 is considered underweight. ? A BMI of 18.5 to 24.9 is normal. ? A BMI of 25 to 29.9 is considered overweight. ? A BMI of 30 and above is considered obese.  Watch levels of cholesterol and blood lipids  You should start having your blood tested for lipids and cholesterol at 71 years of age, then have this test every 5 years.  You may need to have your cholesterol levels checked more often if: ? Your lipid or cholesterol levels are high. ? You are older than 71 years of age. ? You are at high risk for heart disease.  Cancer screening Lung Cancer  Lung cancer screening is recommended for adults 3-60 years old who are at high risk for lung cancer because of a history of smoking.  A yearly low-dose CT scan of the lungs is recommended for people who: ? Currently smoke. ? Have quit within the past 15 years. ? Have at least a 30-pack-year history of smoking. A pack year is smoking an average of one pack of cigarettes a day for 1 year.  Yearly screening should continue until it has been 15 years since you quit.  Yearly screening should stop if you develop a health problem that would prevent you from having lung cancer treatment.  Breast Cancer  Practice breast self-awareness. This means understanding how your breasts normally appear and feel.  It also means doing regular breast self-exams. Let your health  care provider know about any changes, no matter how small.  If you are in your 20s or 30s, you should have a clinical breast exam (CBE) by a health care provider every 1-3 years as part of a regular health exam.  If you are 59 or older, have a CBE every year. Also consider having a breast X-ray (mammogram) every year.  If you have a family history of breast cancer, talk to your health care provider about genetic screening.  If you are at high risk for breast cancer, talk to your  health care provider about having an MRI and a mammogram every year.  Breast cancer gene (BRCA) assessment is recommended for women who have family members with BRCA-related cancers. BRCA-related cancers include: ? Breast. ? Ovarian. ? Tubal. ? Peritoneal cancers.  Results of the assessment will determine the need for genetic counseling and BRCA1 and BRCA2 testing.  Cervical Cancer Your health care provider may recommend that you be screened regularly for cancer of the pelvic organs (ovaries, uterus, and vagina). This screening involves a pelvic examination, including checking for microscopic changes to the surface of your cervix (Pap test). You may be encouraged to have this screening done every 3 years, beginning at age 66.  For women ages 12-65, health care providers may recommend pelvic exams and Pap testing every 3 years, or they may recommend the Pap and pelvic exam, combined with testing for human papilloma virus (HPV), every 5 years. Some types of HPV increase your risk of cervical cancer. Testing for HPV may also be done on women of any age with unclear Pap test results.  Other health care providers may not recommend any screening for nonpregnant women who are considered low risk for pelvic cancer and who do not have symptoms. Ask your health care provider if a screening pelvic exam is right for you.  If you have had past treatment for cervical cancer or a condition that could lead to cancer, you need Pap tests and screening for cancer for at least 20 years after your treatment. If Pap tests have been discontinued, your risk factors (such as having a new sexual partner) need to be reassessed to determine if screening should resume. Some women have medical problems that increase the chance of getting cervical cancer. In these cases, your health care provider may recommend more frequent screening and Pap tests.  Colorectal Cancer  This type of cancer can be detected and often  prevented.  Routine colorectal cancer screening usually begins at 71 years of age and continues through 71 years of age.  Your health care provider may recommend screening at an earlier age if you have risk factors for colon cancer.  Your health care provider may also recommend using home test kits to check for hidden blood in the stool.  A small camera at the end of a tube can be used to examine your colon directly (sigmoidoscopy or colonoscopy). This is done to check for the earliest forms of colorectal cancer.  Routine screening usually begins at age 34.  Direct examination of the colon should be repeated every 5-10 years through 71 years of age. However, you may need to be screened more often if early forms of precancerous polyps or small growths are found.  Skin Cancer  Check your skin from head to toe regularly.  Tell your health care provider about any new moles or changes in moles, especially if there is a change in a mole's shape or color.  Also tell  your health care provider if you have a mole that is larger than the size of a pencil eraser.  Always use sunscreen. Apply sunscreen liberally and repeatedly throughout the day.  Protect yourself by wearing long sleeves, pants, a wide-brimmed hat, and sunglasses whenever you are outside.  Heart disease, diabetes, and high blood pressure  High blood pressure causes heart disease and increases the risk of stroke. High blood pressure is more likely to develop in: ? People who have blood pressure in the high end of the normal range (130-139/85-89 mm Hg). ? People who are overweight or obese. ? People who are African American.  If you are 87-32 years of age, have your blood pressure checked every 3-5 years. If you are 74 years of age or older, have your blood pressure checked every year. You should have your blood pressure measured twice-once when you are at a hospital or clinic, and once when you are not at a hospital or clinic.  Record the average of the two measurements. To check your blood pressure when you are not at a hospital or clinic, you can use: ? An automated blood pressure machine at a pharmacy. ? A home blood pressure monitor.  If you are between 62 years and 71 years old, ask your health care provider if you should take aspirin to prevent strokes.  Have regular diabetes screenings. This involves taking a blood sample to check your fasting blood sugar level. ? If you are at a normal weight and have a low risk for diabetes, have this test once every three years after 71 years of age. ? If you are overweight and have a high risk for diabetes, consider being tested at a younger age or more often. Preventing infection Hepatitis B  If you have a higher risk for hepatitis B, you should be screened for this virus. You are considered at high risk for hepatitis B if: ? You were born in a country where hepatitis B is common. Ask your health care provider which countries are considered high risk. ? Your parents were born in a high-risk country, and you have not been immunized against hepatitis B (hepatitis B vaccine). ? You have HIV or AIDS. ? You use needles to inject street drugs. ? You live with someone who has hepatitis B. ? You have had sex with someone who has hepatitis B. ? You get hemodialysis treatment. ? You take certain medicines for conditions, including cancer, organ transplantation, and autoimmune conditions.  Hepatitis C  Blood testing is recommended for: ? Everyone born from [birth year] through 1965. ? Anyone with known risk factors for hepatitis C.  Sexually transmitted infections (STIs)  You should be screened for sexually transmitted infections (STIs) including gonorrhea and chlamydia if: ? You are sexually active and are younger than 71 years of age. ? You are older than 71 years of age and your health care provider tells you that you are at risk for this type of infection. ? Your sexual  activity has changed since you were last screened and you are at an increased risk for chlamydia or gonorrhea. Ask your health care provider if you are at risk.  If you do not have HIV, but are at risk, it may be recommended that you take a prescription medicine daily to prevent HIV infection. This is called pre-exposure prophylaxis (PrEP). You are considered at risk if: ? You are sexually active and do not regularly use condoms or know the HIV status of your partner(s). ?  You take drugs by injection. ? You are sexually active with a partner who has HIV.  Talk with your health care provider about whether you are at high risk of being infected with HIV. If you choose to begin PrEP, you should first be tested for HIV. You should then be tested every 3 months for as long as you are taking PrEP. Pregnancy  If you are premenopausal and you may become pregnant, ask your health care provider about preconception counseling.  If you may become pregnant, take 400 to 800 micrograms (mcg) of folic acid every day.  If you want to prevent pregnancy, talk to your health care provider about birth control (contraception). Osteoporosis and menopause  Osteoporosis is a disease in which the bones lose minerals and strength with aging. This can result in serious bone fractures. Your risk for osteoporosis can be identified using a bone density scan.  If you are 17 years of age or older, or if you are at risk for osteoporosis and fractures, ask your health care provider if you should be screened.  Ask your health care provider whether you should take a calcium or vitamin D supplement to lower your risk for osteoporosis.  Menopause may have certain physical symptoms and risks.  Hormone replacement therapy may reduce some of these symptoms and risks. Talk to your health care provider about whether hormone replacement therapy is right for you. Follow these instructions at home:  Schedule regular health, dental,  and eye exams.  Stay current with your immunizations.  Do not use any tobacco products including cigarettes, chewing tobacco, or electronic cigarettes.  If you are pregnant, do not drink alcohol.  If you are breastfeeding, limit how much and how often you drink alcohol.  Limit alcohol intake to no more than 1 drink per day for nonpregnant women. One drink equals 12 ounces of beer, 5 ounces of wine, or 1 ounces of hard liquor.  Do not use street drugs.  Do not share needles.  Ask your health care provider for help if you need support or information about quitting drugs.  Tell your health care provider if you often feel depressed.  Tell your health care provider if you have ever been abused or do not feel safe at home. This information is not intended to replace advice given to you by your health care provider. Make sure you discuss any questions you have with your health care provider. Document Released: 06/01/2011 Document Revised: 04/23/2016 Document Reviewed: 08/20/2015 Elsevier Interactive Patient Education  [employer].

## 2017-08-24 NOTE — Assessment & Plan Note (Signed)
Pt's PE WNL w/ exception of being overweight.  UTD on colonoscopy.  mammo and DEXA ordered.  UTD on immunizations.  Check labs.  Anticipatory guidance provided.

## 2017-08-25 ENCOUNTER — Telehealth: Payer: Self-pay | Admitting: Family Medicine

## 2017-08-25 ENCOUNTER — Other Ambulatory Visit: Payer: Self-pay | Admitting: General Practice

## 2017-08-25 MED ORDER — VITAMIN D (ERGOCALCIFEROL) 1.25 MG (50000 UNIT) PO CAPS: 50000.0000 [IU] | ORAL_CAPSULE | ORAL | 0 refills | Status: DC

## 2017-08-25 NOTE — Progress Notes (Signed)
Called pt and lmovm to return call.

## 2017-08-25 NOTE — Telephone Encounter (Signed)
Pt informed of results, documented on results.

## 2017-08-25 NOTE — Telephone Encounter (Signed)
Pt returning call to [facility] regarding lab results.

## 2017-10-04 ENCOUNTER — Other Ambulatory Visit: Payer: Self-pay | Admitting: Family Medicine

## 2017-10-15 DIAGNOSIS — M8588 Other specified disorders of bone density and structure, other site: Secondary | ICD-10-CM | POA: Diagnosis not present

## 2017-10-15 DIAGNOSIS — Z1231 Encounter for screening mammogram for malignant neoplasm of breast: Secondary | ICD-10-CM | POA: Diagnosis not present

## 2017-10-15 LAB — HM DEXA SCAN

## 2017-10-15 LAB — HM MAMMOGRAPHY

## 2017-10-19 ENCOUNTER — Encounter: Payer: Self-pay | Admitting: General Practice

## 2017-11-05 ENCOUNTER — Encounter: Payer: Self-pay | Admitting: General Practice

## 2017-11-15 ENCOUNTER — Other Ambulatory Visit: Payer: Self-pay | Admitting: Neurology

## 2017-11-15 ENCOUNTER — Telehealth: Payer: Self-pay | Admitting: Neurology

## 2017-11-15 MED ORDER — CARBAMAZEPINE 200 MG PO TABS: 200.0000 mg | ORAL_TABLET | Freq: Four times a day (QID) | ORAL | 2 refills | Status: DC

## 2017-11-15 NOTE — Telephone Encounter (Signed)
Request sent 

## 2017-11-15 NOTE — Telephone Encounter (Signed)
Pt request refill for carbamazepine (TEGRETOL) 200 MG tablet 90 day supply sent to [facility]

## 2017-11-16 ENCOUNTER — Ambulatory Visit: Payer: PPO | Admitting: Podiatry

## 2017-11-16 DIAGNOSIS — M2042 Other hammer toe(s) (acquired), left foot: Secondary | ICD-10-CM | POA: Diagnosis not present

## 2017-11-16 NOTE — Progress Notes (Signed)
She presents today states that she has a bump to the dorsal of her foot.  She states there is right here she points to the hallux interphalangeal joint of the right foot and states that it just come up to 3 days ago.  Objective: Vital signs are stable she is alert and oriented x3.  Pulses are palpable.  She has a small firm red nodule to the dorsal medial aspect of the hallux interphalangeal joint.  There is a mild hallux malleus associated with this toe and this appears to be more of a arthritic spur rather than any type of cyst it is not pulsatile is not fluctuant.  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  Plan: Follow-up with me on an as-needed basis discussed the possible need for surgical intervention.

## 2017-12-27 NOTE — Progress Notes (Signed)
[facility]  PATIENT: Laura Stuart DOB: [birth year]   REASON FOR VISIT: Follow-up for obstructive sleep apnea/CPAP compliance, trigeminal neuralgia HISTORY FROM: Patient    HISTORY OF PRESENT ILLNESS:UPDATE 1/29/2019CM Laura Stuart, 72 year old female returns for follow-up with history of obstructive sleep apnea on CPAP.  She admits to not using her CPAP 1 day a week because she goes to her daughter's house to get her grandchildren prepared  for school.  CPAP compliance data dated 09/23/2017 to 12/23/2017 shows greater than 4 hours at 80%.  Average usage 6 hours 23 minutes AHI 4.0 Leak 95th percentile at 36. ESS 2.  Patient is using nasal pillows and states they are a little loose.  She returns for reevaluation.  Her facial pain is in good control with Tegretol  06/29/17 Laura Stuart, a 72 year old female, presents here for CPAP compliance but no download is available. She brought her machine to [facility] just yesterday and was told the download would be here.  It wasn't our visit it short. Marland Kitchen   HISTORY OF PRESENT ILLNESS:UPDATE 04/09/2018CM  Laura Stuart, 72 year old female returns for follow-up. She has a history of trigeminal neuralgia and obstructive sleep apnea. Her Tegretol was increased at her last visit March 8th. She had hospital admission on 02/26/2017 for abnormal movements and weakness. She had been on a road trip with her family, diagnosed in an urgent care with strep throat started on antibiotics. Today she returned home she read in the car with a family member who had been diagnosed with the flu. The following morning she was extremely weak and could not walk and she had trouble speaking family brought her to the emergency room. MRI of the brain MRA of the brain no acute stroke seen. Mild chronic small vessel ischemic changes noted. 2-D echo ejection fraction was normal carotid Doppler unremarkable.  She remains on aspirin for secondary stroke prevention without further  stroke or TIA symptoms. She has no bruising or bleeding. She remains on Lipitor  for hyperlipidemia. LDL 85. She denies any myalgias. She returns for reevaluation  Interval history from 02/04/2017,Laura Stuart reports that the trigeminal neuralgia has worsened and the area that is affected is enlarged. It involves an area over the upper lip of the right side of the face as well as above the right eyebrow radiating slightly towards the temple not reaching the retroauricular region or the maxillary area. She was treated on Gabapentin before she changed to Tegretol. I will increase the dose. The last " trigeminal attack" was 12 years ago.    REVIEW OF SYSTEMS: Full 14 system review of systems performed and notable only for those listed, all others are neg:  Constitutional: neg  Cardiovascular: neg Ear/Nose/Throat: neg  Skin: neg Eyes: neg Respiratory: neg Gastroitestinal: neg  Hematology/Lymphatic: neg  Endocrine: neg Musculoskeletal:neg Allergy/Immunology: neg Neurological: History of trigeminal neuralgia Psychiatric: neg Sleep : Obstructive sleep apnea with CPAP   ALLERGIES: Allergies  Allergen Reactions  . Penicillins Other (See Comments)    From allergy testing, unsure if allergy exists    HOME MEDICATIONS: Outpatient Medications Prior to Visit  Medication Sig Dispense Refill  . atorvastatin (LIPITOR) 10 MG tablet TAKE 1 TABLET BY MOUTH DAILY AT 6 PM 90 tablet 1  . Calcium Carb-Cholecalciferol (CALCIUM CARBONATE-VITAMIN D3 PO) Take 1 tablet by mouth daily.     . carbamazepine (TEGRETOL) 200 MG tablet Take 1 tablet (200 mg total) by mouth QID. Take 1 tab breakfast, 1 tab at lunch  and then 2 at bedtime 360 tablet 2  . zolpidem (AMBIEN) 10 MG tablet Take 1 tablet (10 mg total) by mouth at bedtime as needed for sleep. 30 tablet 5  . Vitamin D, Ergocalciferol, (DRISDOL) 50000 units CAPS capsule Take 1 capsule (50,000 Units total) by mouth every 7 (seven) days. 12 capsule 0    No facility-administered medications prior to visit.     PAST MEDICAL HISTORY: Past Medical History:  Diagnosis Date  . Allergy   . Atypical facial pain   . Hyperlipidemia 08/24/2017  . Neuralgia    Trigeminal  . Sleep apnea    using cpap,[facility]  . Trigeminal neuralgia     PAST SURGICAL HISTORY: Past Surgical History:  Procedure Laterality Date  . FOOT SURGERY Left 05/07/2017  . ROOT CANAL  06/2013  . toes right foot  2010  . TONSILLECTOMY AND ADENOIDECTOMY  1950  . torn miniscus  2003  . VAGINAL DELIVERY     X2    FAMILY HISTORY: Family History  Problem Relation Age of Onset  . Stroke Mother   . Brain cancer Mother   . Lung cancer Mother   . Heart attack Mother   . Emphysema Father   . Alzheimer's disease Sister   . Dementia Other        evident after a fall with a hip fracture  . Colon cancer Neg Hx     SOCIAL HISTORY: Social History   Socioeconomic History  . Marital status: Married    Spouse name: [name]  . Number of children: 2  . Years of education: Grad sch.  . Highest education level: Not on file  Social Needs  . Financial resource strain: Not on file  . Food insecurity - worry: Not on file  . Food insecurity - inability: Not on file  . Transportation needs - medical: Not on file  . 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 Smoking status: Never Smoker  . Smokeless tobacco: Never Used  Substance and Sexual Activity  . Alcohol use: Yes    Alcohol/week: 0.0 oz    Comment: 1 glass every 2 months; socially  . Drug use: No  . Sexual activity: Not on file  Other Topics Concern  . Not on file  Social History Narrative   Patient is married [name]) and lives at home with her husband.   Patient has two adult children.   Patient is retired.   Patient has a Production designer, theatre/television/film.   Patient is right-handed.    Patient drinks 1 1/2 cups of coffee daily.     PHYSICAL EXAM  Vitals:   12/28/17 0758  BP: 119/70  Pulse: 68  Weight: 181 lb (82.1 kg)   Body mass index is 30.12 kg/m.  Generalized: Well developed, in no acute distress  Head: normocephalic and atraumatic,. Oropharynx benign  Neck: Supple,   Musculoskeletal: No deformity   Neurological examination   Mentation: Alert oriented to time, place, history taking. Attention span and concentration appropriate. Recent and remote memory intact.  Follows all commands speech and language fluent.   Cranial nerve II-XII: Pupils were equal round reactive to light extraocular movements were full, visual field were full on confrontational test. Facial sensation and strength were normal. hearing was intact to finger rubbing bilaterally. Uvula tongue midline. head turning and shoulder shrug were normal  and symmetric.Tongue protrusion into cheek strength was normal. Motor: normal bulk and tone, full strength in the BUE, BLE,  Sensory: normal and symmetric to light touch,  Coordination: finger-nose-finger, heel-to-shin bilaterally, no dysmetria Gait and Station: Rising up from seated position without assistance, normal stance,  moderate stride, good arm swing, smooth turning, able to perform tiptoe, and heel walking without difficulty. Tandem gait is steady  DIAGNOSTIC DATA (LABS, IMAGING, TESTING) - I reviewed patient records, labs, notes, testing and imaging myself where available.  Lab Results  Component Value Date   WBC 4.5 08/24/2017   HGB 12.4 08/24/2017   HCT 37.3 08/24/2017   MCV 91.6 08/24/2017   PLT 234.0 08/24/2017      Component Value Date/Time   NA 139 08/24/2017 1041   NA 144 06/29/2017 0959   K 4.7 08/24/2017 1041   CL 104 08/24/2017 1041   CO2 31 08/24/2017 1041   GLUCOSE 99 08/24/2017 1041   BUN 16 08/24/2017 1041   BUN 19 06/29/2017 0959   CREATININE 0.78 08/24/2017 1041   CREATININE 0.79 02/05/2013 1117   CALCIUM  9.7 08/24/2017 1041   PROT 6.6 08/24/2017 1041   PROT 6.4 06/29/2017 0959   ALBUMIN 4.1 08/24/2017 1041   ALBUMIN 4.2 06/29/2017 0959   AST 21 08/24/2017 1041   ALT 15 08/24/2017 1041   ALKPHOS 98 08/24/2017 1041   BILITOT 0.4 08/24/2017 1041   BILITOT <0.2 06/29/2017 0959   GFRNONAA 77 06/29/2017 0959   GFRAA 88 06/29/2017 0959   Lab Results  Component Value Date   CHOL 185 08/24/2017   HDL 70.30 08/24/2017   LDLCALC 98 08/24/2017   LDLDIRECT 106.9 05/22/2013   TRIG 81.0 08/24/2017   CHOLHDL 3 08/24/2017   Lab Results  Component Value Date   HGBA1C 5.5 02/27/2017   No results found for: VITAMINB12 Lab Results  Component Value Date   TSH 2.45 08/24/2017     ASSESSMENT AND PLAN  72 y.o. year old female  has a past medical history of Allergy, Atypical facial pain, Hyperlipidemia (08/24/2017), Neuralgia, Sleep apnea, and Trigeminal neuralgia. here to follow-up for CPAP compliance Compliance data dated 09/23/2017 to 12/23/2017 shows greater than 4 hours at 80%.  Average usage 6 hours 23 minutes AHI 4.0 Leak 95th percentile at 36. ESS 2.   PLAN: CPAP compliance 80% greater than 4 hours reviewed data with patient Continue same settings Need to stop by equipment company and request next size Nasal pillows due to leak  F/U in 6 months for repeat compliance Laura Stuart, [facility], [facility], APRN  [facility] [address] [place], [place] [address] [phone]

## 2017-12-28 ENCOUNTER — Ambulatory Visit (INDEPENDENT_AMBULATORY_CARE_PROVIDER_SITE_OTHER): Payer: PPO | Admitting: Nurse Practitioner

## 2017-12-28 ENCOUNTER — Encounter: Payer: Self-pay | Admitting: Nurse Practitioner

## 2017-12-28 VITALS — BP 119/70 | HR 68 | Wt 181.0 lb

## 2017-12-28 DIAGNOSIS — G500 Trigeminal neuralgia: Secondary | ICD-10-CM | POA: Diagnosis not present

## 2017-12-28 DIAGNOSIS — G4730 Sleep apnea, unspecified: Secondary | ICD-10-CM | POA: Diagnosis not present

## 2017-12-28 NOTE — Patient Instructions (Signed)
CPAP compliance 80% greater than 4 hours Continue same settings Need to stop equipment company and request next size Nasal pillows due to leak  F/U in 6 months for repeat compliance

## 2017-12-30 ENCOUNTER — Ambulatory Visit: Payer: PPO | Admitting: Nurse Practitioner

## 2018-01-03 DIAGNOSIS — G4733 Obstructive sleep apnea (adult) (pediatric): Secondary | ICD-10-CM | POA: Diagnosis not present

## 2018-02-17 ENCOUNTER — Other Ambulatory Visit: Payer: Self-pay | Admitting: Neurology

## 2018-02-24 ENCOUNTER — Ambulatory Visit (INDEPENDENT_AMBULATORY_CARE_PROVIDER_SITE_OTHER): Payer: PPO | Admitting: Family Medicine

## 2018-02-24 ENCOUNTER — Other Ambulatory Visit: Payer: Self-pay

## 2018-02-24 ENCOUNTER — Encounter: Payer: Self-pay | Admitting: Family Medicine

## 2018-02-24 ENCOUNTER — Encounter: Payer: Self-pay | Admitting: General Practice

## 2018-02-24 VITALS — BP 120/80 | HR 67 | Temp 98.0°F | Resp 16 | Ht 65.0 in | Wt 178.2 lb

## 2018-02-24 DIAGNOSIS — E785 Hyperlipidemia, unspecified: Secondary | ICD-10-CM | POA: Diagnosis not present

## 2018-02-24 DIAGNOSIS — E663 Overweight: Secondary | ICD-10-CM

## 2018-02-24 LAB — HEPATIC FUNCTION PANEL
ALBUMIN: 3.8 g/dL (ref 3.5–5.2)
ALT: 13 U/L (ref 0–35)
AST: 18 U/L (ref 0–37)
Alkaline Phosphatase: 94 U/L (ref 39–117)
BILIRUBIN DIRECT: 0.1 mg/dL (ref 0.0–0.3)
TOTAL PROTEIN: 6.3 g/dL (ref 6.0–8.3)
Total Bilirubin: 0.4 mg/dL (ref 0.2–1.2)

## 2018-02-24 LAB — BASIC METABOLIC PANEL
BUN: 19 mg/dL (ref 6–23)
CALCIUM: 9 mg/dL (ref 8.4–10.5)
CHLORIDE: 106 meq/L (ref 96–112)
CO2: 31 meq/L (ref 19–32)
CREATININE: 0.75 mg/dL (ref 0.40–1.20)
GFR: 80.77 mL/min (ref >60.00)
GLUCOSE: 100 mg/dL — AB (ref 70–99)
Potassium: 4.9 mEq/L (ref 3.5–5.1)
Sodium: 141 mEq/L (ref 135–145)

## 2018-02-24 LAB — LIPID PANEL
CHOLESTEROL: 174 mg/dL (ref 0–200)
HDL: 70.3 mg/dL (ref >39.00)
LDL Cholesterol: 86 mg/dL (ref 0–99)
NonHDL: 103.47
TRIGLYCERIDES: 85 mg/dL (ref 0.0–149.0)
Total CHOL/HDL Ratio: 2
VLDL: 17 mg/dL (ref 0.0–40.0)

## 2018-02-24 NOTE — Patient Instructions (Signed)
Schedule your complete physical in 6 months We'll notify you of your lab results and make any changes if needed Continue to work on healthy diet and regular exercise- you look great!!! Call with any questions or concerns [place]!! 

## 2018-02-24 NOTE — Assessment & Plan Note (Signed)
Pt has lost 3 lbs since last visit.  Encouraged her to continue her healthy diet and regular exercise.  Will continue to follow.

## 2018-02-24 NOTE — Progress Notes (Signed)
   Subjective:    Patient ID: Laura Stuart, female    DOB: 1946/11/27, 72 y.o.   MRN: [id number]  HPI Hyperlipidemia- chronic problem, on Lipitor 10mg  daily.  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  Denies CP, SOB, HAs, visual changes, abd pain, N/V.  Overweight- pt's BMI is 29.66 after losing 3 lbs.  Encouraged her to resume her regular Y workouts (which she plans to do).   Review of Systems For ROS see HPI     Objective:   Physical Exam  Constitutional: She is oriented to person, place, and time. She appears well-developed and well-nourished. No distress.  HENT:  Head: Normocephalic and atraumatic.  Eyes: Pupils are equal, round, and reactive to light. Conjunctivae and EOM are normal.  Neck: Normal range of motion. Neck supple. No thyromegaly present.  Cardiovascular: Normal rate, regular rhythm, normal heart sounds and intact distal pulses.  No murmur heard. Pulmonary/Chest: Effort normal and breath sounds normal. No respiratory distress.  Abdominal: Soft. She exhibits no distension. There is no tenderness.  Musculoskeletal: She exhibits no edema.  Lymphadenopathy:    She has no cervical adenopathy.  Neurological: She is alert and oriented to person, place, and time.  Skin: Skin is warm and dry.  Psychiatric: She has a normal mood and affect. Her behavior is normal.  Vitals reviewed.         Assessment & Plan:

## 2018-02-24 NOTE — Assessment & Plan Note (Signed)
Chronic problem.  Tolerating statin w/o difficulty.  Check labs.  Adjust meds prn  

## 2018-02-25 ENCOUNTER — Telehealth: Payer: Self-pay | Admitting: Family Medicine

## 2018-02-25 NOTE — Telephone Encounter (Signed)
Patient states that she has been on this cream for a long time and would really appreciate it being called in.  She is aware that Dr. Birdie Riddle is out of the office today but that I will send the request to see if this is something we can send for her.

## 2018-02-25 NOTE — Telephone Encounter (Signed)
Copied from [place] [id number]. Topic: Inquiry >> Feb 25, 2018  2:40 PM Oliver Pila B wrote: Reason for CRM: pt called to get a Rx for a hand cream that she use to use if possible, name of Rx is Halcinonide (HALOG) 0.1 % CREA [[id number]]  DISCONTINUED

## 2018-02-27 NOTE — Telephone Encounter (Signed)
Ok to refill Halcinonide 0.1% cream, 1 tube, 1 refill

## 2018-02-28 MED ORDER — HALCINONIDE 0.1 % EX CREA: 1.0000 "application " | TOPICAL_CREAM | Freq: Every day | CUTANEOUS | 0 refills | Status: DC | PRN

## 2018-02-28 NOTE — Addendum Note (Signed)
Addended by: Davis Gourd on: 02/28/2018 08:47 AM   Modules accepted: Orders

## 2018-02-28 NOTE — Telephone Encounter (Signed)
Medication filled to pharmacy as requested.   

## 2018-03-28 ENCOUNTER — Ambulatory Visit: Payer: Self-pay

## 2018-03-28 NOTE — Telephone Encounter (Signed)
Pt. Reports "problems with allergies.Runny nose, congestion." Today, noticed some redness and filmy feeling to eye. Will try home remedies and OTC allergy eye drops. If no better, will call back.  Reason for Disposition . [1] Very small amount of discharge AND [2] only in corner of eye  Answer Assessment - Initial Assessment Questions 1. EYE DISCHARGE: "Is the discharge in one or both eyes?" "What color is it?" "How much is there?" "When did the discharge start?"      Right eye 2. REDNESS OF SCLERA: "Is the redness in one or both eyes?" "When did the redness start?"      Redness to inside in the corner 3. EYELIDS: "Are the eyelids red or swollen?" If so, ask: "How much?"       Red, not swollen 4. VISION: "Is there any difficulty seeing clearly?"      A little blurry 5. PAIN: "Is there any pain? If so, ask: "How bad is it?" (Scale 1-10; or mild, moderate, severe)    - MILD (1-3): doesn't interfere with normal activities     - MODERATE (4-7): interferes with normal activities or awakens from sleep    - SEVERE (8-10): excruciating pain, unable to do any normal activities       No 6. CONTACT LENS: "Do you wear contacts?"     Yes, but using her glasses 7. OTHER SYMPTOMS: "Do you have any other symptoms?" (e.g., fever, runny nose, cough)     Runny nose, itchy 8. PREGNANCY: "Is there any chance you are pregnant?" "When was your last menstrual period?"     No  Protocols used: EYE - PUS OR DISCHARGE-A-AH

## 2018-03-30 ENCOUNTER — Other Ambulatory Visit: Payer: Self-pay | Admitting: Family Medicine

## 2018-06-01 ENCOUNTER — Telehealth: Payer: Self-pay | Admitting: *Deleted

## 2018-06-01 NOTE — Telephone Encounter (Signed)
We can refer to Ophthalmology (Dr Katy Apo)

## 2018-06-01 NOTE — Telephone Encounter (Signed)
Copied from [place] [id number]. Topic: Referral - Request >> Jun 01, 2018  9:38 AM Scherrie Gerlach wrote: Reason for CRM: pt states she has been having some eye issues, and would like to see to see an ophthalmologist.  Would like to know who Dr Birdie Riddle recommend or refer to? Pt has seen the optometrist, and gotten several scripts, but something else going on with one of her eyes.  They could not help her.

## 2018-06-01 NOTE — Telephone Encounter (Signed)
Patient states that she just wanted the name of someone for right now.  She is going to reach out to them. If she does need a referral she will call back and let me know and I will send it in for her.

## 2018-06-22 ENCOUNTER — Encounter: Payer: Self-pay | Admitting: Nurse Practitioner

## 2018-06-23 NOTE — Progress Notes (Signed)
[facility]  PATIENT: Laura Stuart DOB: 01/23/1946   REASON FOR VISIT: Follow-up for obstructive sleep apnea/CPAP compliance, trigeminal neuralgia HISTORY FROM: Patient    HISTORY OF PRESENT ILLNESS:UPDATE 7/29/2019CM Laura Stuart, 72 year old female returns for follow-up with history of obstructive sleep apnea here for CPAP compliance.  In addition she also has trigeminal neuralgia well-controlled on carbamazepine.  Reviewed recent hepatic function basic metabolic panel and lipid profile from 02/24/2018.  Labs were within normal range.  She is doing well with her CPAP.  Compliance data dated 05/23/2018-06/22/2018 shows compliance greater than 4 hours at 96%.  Average usage 7 hours 22 minutes.  Pressure 5- 8 cm AHI 4.6.  ESS 1.  She returns for reevaluation    UPDATE 1/29/2019CM Laura Stuart, 72 year old female returns for follow-up with history of obstructive sleep apnea on CPAP.  She admits to not using her CPAP 1 day a week because she goes to her daughter's house to get her grandchildren prepared  for school.  CPAP compliance data dated 09/23/2017 to 12/23/2017 shows greater than 4 hours at 80%.  Average usage 6 hours 23 minutes AHI 4.0 Leak 95th percentile at 36. ESS 2.  Patient is using nasal pillows and states they are a little loose.  She returns for reevaluation.  Her facial pain is in good control with Tegretol  06/29/17 CDMr Stuart, a 72 year old female, presents here for CPAP compliance but no download is available. She brought her machine to [facility] just yesterday and was told the download would be here.  It wasn't our visit it short. Marland Kitchen   HISTORY OF PRESENT ILLNESS:UPDATE 04/09/2018CM  Laura Stuart, 72 year old female returns for follow-up. She has a history of trigeminal neuralgia and obstructive sleep apnea. Her Tegretol was increased at her last visit March 8th. She had hospital admission on 02/26/2017 for abnormal movements and weakness. She had been on a road trip  with her family, diagnosed in an urgent care with strep throat started on antibiotics. Today she returned home she read in the car with a family member who had been diagnosed with the flu. The following morning she was extremely weak and could not walk and she had trouble speaking family brought her to the emergency room. MRI of the brain MRA of the brain no acute stroke seen. Mild chronic small vessel ischemic changes noted. 2-D echo ejection fraction was normal carotid Doppler unremarkable.  She remains on aspirin for secondary stroke prevention without further stroke or TIA symptoms. She has no bruising or bleeding. She remains on Lipitor  for hyperlipidemia. LDL 85. She denies any myalgias. She returns for reevaluation  Interval history from 02/04/2017,CD Laura. Stuart reports that the trigeminal neuralgia has worsened and the area that is affected is enlarged. It involves an area over the upper lip of the right side of the face as well as above the right eyebrow radiating slightly towards the temple not reaching the retroauricular region or the maxillary area. She was treated on Gabapentin before she changed to Tegretol. I will increase the dose. The last " trigeminal attack" was 12 years ago.    REVIEW OF SYSTEMS: Full 14 system review of systems performed and notable only for those listed, all others are neg:  Constitutional: neg  Cardiovascular: neg Ear/Nose/Throat: neg  Skin: neg Eyes: neg Respiratory: neg Gastroitestinal: neg  Hematology/Lymphatic: neg  Endocrine: neg Musculoskeletal: Joint pain Allergy/Immunology: neg Neurological: History of trigeminal neuralgia Psychiatric: neg Sleep : Obstructive sleep apnea with CPAP  ALLERGIES: Allergies  Allergen Reactions  . Penicillins Other (See Comments)    From allergy testing, unsure if allergy exists    HOME MEDICATIONS: Outpatient Medications Prior to Visit  Medication Sig Dispense Refill  . atorvastatin (LIPITOR) 10 MG  tablet TAKE 1 TABLET BY MOUTH DAILY AT 6 PM 90 tablet 1  . Calcium Carb-Cholecalciferol (CALCIUM CARBONATE-VITAMIN D3 PO) Take 1 tablet by mouth daily.     . carbamazepine (TEGRETOL) 200 MG tablet Take 1 tablet (200 mg total) by mouth QID. Take 1 tab breakfast, 1 tab at lunch and then 2 at bedtime 360 tablet 2  . Halcinonide (HALOG) 0.1 % CREA Apply 1 application topically daily as needed (sparingly for itching). 60 g 0  . zolpidem (AMBIEN) 10 MG tablet Take 0.5-1 tablets (5-10 mg total) by mouth at bedtime as needed. for sleep 30 tablet 5   No facility-administered medications prior to visit.     PAST MEDICAL HISTORY: Past Medical History:  Diagnosis Date  . Allergy   . Atypical facial pain   . Hyperlipidemia 08/24/2017  . Neuralgia    Trigeminal  . Sleep apnea    using cpap,[facility]  . Trigeminal neuralgia     PAST SURGICAL HISTORY: Past Surgical History:  Procedure Laterality Date  . FOOT SURGERY Left 05/07/2017  . ROOT CANAL  06/2013  . toes right foot  2010  . TONSILLECTOMY AND ADENOIDECTOMY  1950  . torn miniscus  2003  . VAGINAL DELIVERY     X2    FAMILY HISTORY: Family History  Problem Relation Age of Onset  . Stroke Mother   . Brain cancer Mother   . Lung cancer Mother   . Heart attack Mother   . Emphysema Father   . Alzheimer's disease Sister   . Dementia Other        evident after a fall with a hip fracture  . Colon cancer Neg Hx     SOCIAL HISTORY: Social History   Socioeconomic History  . Marital status: Married    Spouse name: Laura Stuart  . Number of children: 2  . Years of education: Grad sch.  . 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 Financial resource strain: Not on file  . Food insecurity:    Worry: Not on file    Inability: Not on file  . Transportation needs:    Medical: Not on file    Non-medical: Not on file    Tobacco Use  . Smoking status: Never Smoker  . Smokeless tobacco: Never Used  Substance and Sexual Activity  . Alcohol use: Yes    Alcohol/week: 0.0 oz    Comment: 1 glass every 2 months; socially  . Drug use: No  . Sexual activity: Not on file  Lifestyle  . Physical activity:    Days per week: Not on file    Minutes per session: Not on file  . Stress: Not on file  Relationships  . Social connections:    Talks on phone: Not on file    Gets together: Not on file    Attends religious service: Not on file    Active member of club or organization: Not on file    Attends meetings of clubs or organizations: Not on file    Relationship status: Not on file  . Intimate partner violence:  Fear of current or ex partner: Not on file    Emotionally abused: Not on file    Physically abused: Not on file    Forced sexual activity: Not on file  Other Topics Concern  . Not on file  Social History Narrative   Patient is married Laura Stuart) and lives at home with her husband.   Patient has two adult children.   Patient is retired.   Patient has a Production designer, theatre/television/film.   Patient is right-handed.   Patient drinks 1 1/2 cups of coffee daily.     PHYSICAL EXAM  Vitals:   06/27/18 1105  BP: 132/73  Pulse: 76  Weight: 177 lb 12.8 oz (80.6 kg)  Height: 5\' 5"  (1.651 m)   Body mass index is 29.59 kg/m.  Generalized: Well developed, in no acute distress  Head: normocephalic and atraumatic,. Oropharynx benign mallopatti 3 Neck: Supple, circumference  13.5  Lungs clear Musculoskeletal: No deformity  Skin no peripheral edema Neurological examination   Mentation: Alert oriented to time, place, history taking. Attention span and concentration appropriate. Recent and remote memory intact.  Follows all commands speech and language fluent.   Cranial nerve II-XII: Pupils were equal round reactive to light extraocular movements were full, visual field were full on confrontational test. Facial  sensation and strength were normal. hearing was intact to finger rubbing bilaterally. Uvula tongue midline. head turning and shoulder shrug were normal and symmetric.Tongue protrusion into cheek strength was normal. Motor: normal bulk and tone, full strength in the BUE, BLE,  Sensory: normal and symmetric to light touch,  Coordination: finger-nose-finger, heel-to-shin bilaterally, no dysmetria Gait and Station: Rising up from seated position without assistance, normal stance,  moderate stride, good arm swing, smooth turning, able to perform tiptoe, and heel walking without difficulty. Tandem gait is steady.  No assistive device  DIAGNOSTIC DATA (LABS, IMAGING, TESTING) - I reviewed patient records, labs, notes, testing and imaging myself where available.  Lab Results  Component Value Date   WBC 4.5 08/24/2017   HGB 12.4 08/24/2017   HCT 37.3 08/24/2017   MCV 91.6 08/24/2017   PLT 234.0 08/24/2017      Component Value Date/Time   NA 141 02/24/2018 1000   NA 144 06/29/2017 0959   K 4.9 02/24/2018 1000   CL 106 02/24/2018 1000   CO2 31 02/24/2018 1000   GLUCOSE 100 (H) 02/24/2018 1000   BUN 19 02/24/2018 1000   BUN 19 06/29/2017 0959   CREATININE 0.75 02/24/2018 1000   CREATININE 0.79 02/05/2013 1117   CALCIUM 9.0 02/24/2018 1000   PROT 6.3 02/24/2018 1000   PROT 6.4 06/29/2017 0959   ALBUMIN 3.8 02/24/2018 1000   ALBUMIN 4.2 06/29/2017 0959   AST 18 02/24/2018 1000   ALT 13 02/24/2018 1000   ALKPHOS 94 02/24/2018 1000   BILITOT 0.4 02/24/2018 1000   BILITOT <0.2 06/29/2017 0959   GFRNONAA 77 06/29/2017 0959   GFRAA 88 06/29/2017 0959   Lab Results  Component Value Date   CHOL 174 02/24/2018   HDL 70.30 02/24/2018   LDLCALC 86 02/24/2018   LDLDIRECT 106.9 05/22/2013   TRIG 85.0 02/24/2018   CHOLHDL 2 02/24/2018   Lab Results  Component Value Date   HGBA1C 5.5 02/27/2017    Lab Results  Component Value Date   TSH 2.45 08/24/2017     ASSESSMENT AND PLAN  72  y.o. year old female  has a past medical history of Allergy, Atypical facial pain, Hyperlipidemia (08/24/2017),  Neuralgia, Sleep apnea, and Trigeminal neuralgia. here to follow-up for CPAP compliance.Data dated 05/23/2018-06/22/2018 shows compliance greater than 4 hours at 96%.  Average usage 7 hours 22 minutes.  Pressure 5- 8 cm AHI 4.6.  ESS 1.    PLAN: CPAP compliance 96% greater than 4 hours  Continue same settings Continue carbamazepine for trigeminal neuralgia does not need refills F/U yearly for  repeat compliance Reviewed recent hepatic function basic metabolic panel and lipid profile from 02/24/2018.  Labs were within normal range.  Dennie Bible, [facility], [facility], APRN  [facility] [address], [place], [place] [address] [phone]

## 2018-06-27 ENCOUNTER — Encounter: Payer: Self-pay | Admitting: Nurse Practitioner

## 2018-06-27 ENCOUNTER — Ambulatory Visit (INDEPENDENT_AMBULATORY_CARE_PROVIDER_SITE_OTHER): Payer: PPO | Admitting: Nurse Practitioner

## 2018-06-27 VITALS — BP 132/73 | HR 76 | Ht 65.0 in | Wt 177.8 lb

## 2018-06-27 DIAGNOSIS — B0222 Postherpetic trigeminal neuralgia: Secondary | ICD-10-CM

## 2018-06-27 DIAGNOSIS — G4730 Sleep apnea, unspecified: Secondary | ICD-10-CM | POA: Diagnosis not present

## 2018-06-27 NOTE — Patient Instructions (Signed)
CPAP compliance 96% greater than 4 hours  Continue same settings Continue carbamazepine for trigeminal neuralgia does not need refills F/U yearly for  repeat compliance

## 2018-07-14 ENCOUNTER — Ambulatory Visit: Payer: PPO

## 2018-07-21 ENCOUNTER — Telehealth: Payer: Self-pay | Admitting: Family Medicine

## 2018-07-21 NOTE — Telephone Encounter (Signed)
Copied from [place] [id number]. Topic: Quick Communication - See Telephone Encounter >> Jul 21, 2018 11:18 AM Sheran Luz wrote: CRM for notification. See Telephone encounter for: 07/21/18.  Pt called stating that she would like to speak with a nurse regarding some tests that she needs to have done but isn't sure when or if she has already previously had them. Pt inquired about TSH and Free t4. Pt would like a call back to discuss. Best cb# [phone]

## 2018-07-21 NOTE — Telephone Encounter (Signed)
Called patient and answered her question for her and she will discuss at her visit on 08/04/18 about TSH and Free T4 labs.

## 2018-08-03 NOTE — Progress Notes (Signed)
I agree with the assessment and plan, as directed and discussed with NP. Larey Seat, MD

## 2018-08-04 ENCOUNTER — Ambulatory Visit (INDEPENDENT_AMBULATORY_CARE_PROVIDER_SITE_OTHER): Payer: PPO | Admitting: Family Medicine

## 2018-08-04 ENCOUNTER — Other Ambulatory Visit: Payer: Self-pay

## 2018-08-04 ENCOUNTER — Other Ambulatory Visit: Payer: Self-pay | Admitting: General Practice

## 2018-08-04 ENCOUNTER — Encounter: Payer: Self-pay | Admitting: Family Medicine

## 2018-08-04 VITALS — BP 126/70 | HR 69 | Temp 98.1°F | Resp 16 | Ht 65.0 in | Wt 175.5 lb

## 2018-08-04 DIAGNOSIS — E559 Vitamin D deficiency, unspecified: Secondary | ICD-10-CM

## 2018-08-04 DIAGNOSIS — Z23 Encounter for immunization: Secondary | ICD-10-CM

## 2018-08-04 DIAGNOSIS — Z0000 Encounter for general adult medical examination without abnormal findings: Secondary | ICD-10-CM

## 2018-08-04 DIAGNOSIS — E785 Hyperlipidemia, unspecified: Secondary | ICD-10-CM | POA: Diagnosis not present

## 2018-08-04 LAB — CBC WITH DIFFERENTIAL/PLATELET
BASOS ABS: 0 10*3/uL (ref 0.0–0.1)
Basophils Relative: 0.6 % (ref 0.0–3.0)
EOS ABS: 0.1 10*3/uL (ref 0.0–0.7)
Eosinophils Relative: 1.7 % (ref 0.0–5.0)
HCT: 37.1 % (ref 36.0–46.0)
HEMOGLOBIN: 12.6 g/dL (ref 12.0–15.0)
LYMPHS PCT: 24.2 % (ref 12.0–46.0)
Lymphs Abs: 1.3 10*3/uL (ref 0.7–4.0)
MCHC: 34 g/dL (ref 30.0–36.0)
MCV: 90.3 fl (ref 78.0–100.0)
MONOS PCT: 7.2 % (ref 3.0–12.0)
Monocytes Absolute: 0.4 10*3/uL (ref 0.1–1.0)
NEUTROS ABS: 3.7 10*3/uL (ref 1.4–7.7)
Neutrophils Relative %: 66.3 % (ref 43.0–77.0)
PLATELETS: 226 10*3/uL (ref 150.0–400.0)
RBC: 4.11 Mil/uL (ref 3.87–5.11)
RDW: 13.7 % (ref 11.5–15.5)
WBC: 5.5 10*3/uL (ref 4.0–10.5)

## 2018-08-04 LAB — BASIC METABOLIC PANEL
BUN: 20 mg/dL (ref 6–23)
CO2: 31 mEq/L (ref 19–32)
CREATININE: 0.85 mg/dL (ref 0.40–1.20)
Calcium: 9.7 mg/dL (ref 8.4–10.5)
Chloride: 105 mEq/L (ref 96–112)
GFR: 69.82 mL/min (ref >60.00)
GLUCOSE: 90 mg/dL (ref 70–99)
Potassium: 4.9 mEq/L (ref 3.5–5.1)
SODIUM: 140 meq/L (ref 135–145)

## 2018-08-04 LAB — HEPATIC FUNCTION PANEL
ALT: 12 U/L (ref 0–35)
AST: 19 U/L (ref 0–37)
Albumin: 4 g/dL (ref 3.5–5.2)
Alkaline Phosphatase: 86 U/L (ref 39–117)
BILIRUBIN DIRECT: 0.1 mg/dL (ref 0.0–0.3)
TOTAL PROTEIN: 6.2 g/dL (ref 6.0–8.3)
Total Bilirubin: 0.4 mg/dL (ref 0.2–1.2)

## 2018-08-04 LAB — LIPID PANEL
CHOLESTEROL: 205 mg/dL — AB (ref 0–200)
HDL: 67.8 mg/dL (ref >39.00)
LDL CALC: 116 mg/dL — AB (ref 0–99)
NONHDL: 137.23
Total CHOL/HDL Ratio: 3
Triglycerides: 106 mg/dL (ref 0.0–149.0)
VLDL: 21.2 mg/dL (ref 0.0–40.0)

## 2018-08-04 LAB — VITAMIN D 25 HYDROXY (VIT D DEFICIENCY, FRACTURES): VITD: 26.79 ng/mL — AB (ref 30.00–100.00)

## 2018-08-04 LAB — TSH: TSH: 2.99 u[IU]/mL (ref 0.35–4.50)

## 2018-08-04 MED ORDER — VITAMIN D (ERGOCALCIFEROL) 1.25 MG (50000 UNIT) PO CAPS: 50000.0000 [IU] | ORAL_CAPSULE | ORAL | 0 refills | Status: DC

## 2018-08-04 NOTE — Patient Instructions (Addendum)
Schedule your Medicare Wellness Visit w/ Maudie Mercury at your convenience Follow up with me in 6 months to recheck your cholesterol Please go to the pharmacy and get your Tdap to protect the baby Keep up the good work on healthy diet and regular exercise! Call with any questions or concerns Happy Fall!!

## 2018-08-04 NOTE — Assessment & Plan Note (Signed)
Pt's PE WNL w/ exception of weight.  UTD on mammo, colonoscopy.  Flu shot given today.  Check labs.  Anticipatory guidance provided.

## 2018-08-04 NOTE — Progress Notes (Signed)
   Subjective:    Patient ID: Laura Stuart, female    DOB: November 01, 1946, 72 y.o.   MRN: [id number]  HPI CPE- UTD on mammo, colonoscopy (due next year), immunizations (due for flu).  No concerns today.   Review of Systems Patient reports no vision/ hearing changes, adenopathy,fever, weight change,  persistant/recurrent hoarseness , swallowing issues, chest pain, palpitations, edema, persistant/recurrent cough, hemoptysis, dyspnea (rest/exertional/paroxysmal nocturnal), gastrointestinal bleeding (melena, rectal bleeding), abdominal pain, significant heartburn, bowel changes, GU symptoms (dysuria, hematuria, incontinence), Gyn symptoms (abnormal  bleeding, pain),  syncope, focal weakness, memory loss, numbness & tingling, skin/hair/nail changes, abnormal bruising or bleeding, anxiety, or depression.     Objective:   Physical Exam General Appearance:    Alert, cooperative, no distress, appears stated age  Head:    Normocephalic, without obvious abnormality, atraumatic  Eyes:    PERRL, conjunctiva/corneas clear, EOM's intact, fundi    benign, both eyes  Ears:    Normal TM's and external ear canals, both ears  Nose:   Nares normal, septum midline, mucosa normal, no drainage    or sinus tenderness  Throat:   Lips, mucosa, and tongue normal; teeth and gums normal  Neck:   Supple, symmetrical, trachea midline, no adenopathy;    Thyroid: no enlargement/tenderness/nodules  Back:     Symmetric, no curvature, ROM normal, no CVA tenderness  Lungs:     Clear to auscultation bilaterally, respirations unlabored  Chest Wall:    No tenderness or deformity   Heart:    Regular rate and rhythm, S1 and S2 normal, no murmur, rub   or gallop  Breast Exam:    Deferred to mammo  Abdomen:     Soft, non-tender, bowel sounds active all four quadrants,    no masses, no organomegaly  Genitalia:    Deferred  Rectal:    Extremities:   Extremities normal, atraumatic, no cyanosis or edema  Pulses:   2+ and  symmetric all extremities  Skin:   Skin color, texture, turgor normal, no rashes or lesions  Lymph nodes:   Cervical, supraclavicular, and axillary nodes normal  Neurologic:   CNII-XII intact, normal strength, sensation and reflexes    throughout          Assessment & Plan:

## 2018-08-04 NOTE — Assessment & Plan Note (Signed)
Pt has hx of this.  Check labs.  Replete prn. 

## 2018-08-04 NOTE — Assessment & Plan Note (Signed)
Chronic problem.  Tolerating statin w/o difficulty.  Check labs.  Adjust meds prn  

## 2018-08-05 ENCOUNTER — Other Ambulatory Visit: Payer: Self-pay | Admitting: Neurology

## 2018-08-11 DIAGNOSIS — H40013 Open angle with borderline findings, low risk, bilateral: Secondary | ICD-10-CM | POA: Diagnosis not present

## 2018-08-11 DIAGNOSIS — H532 Diplopia: Secondary | ICD-10-CM | POA: Diagnosis not present

## 2018-08-30 DIAGNOSIS — H5500 Unspecified nystagmus: Secondary | ICD-10-CM | POA: Diagnosis not present

## 2018-08-31 ENCOUNTER — Telehealth: Payer: Self-pay | Admitting: General Practice

## 2018-08-31 NOTE — Telephone Encounter (Signed)
Copied from [place] [id number]. Topic: Referral - Medical Records >> Aug 30, 2018 12:18 PM Scherrie Gerlach wrote: Reason for CRM: pt is requesting her recent labs be sent to her opthalmology, Dr Jola Schmidt  [address], [place], [place] [address] Phone: [phone]  Pt states she was referred by Dr Birdie Riddle to this dr.

## 2018-08-31 NOTE — Telephone Encounter (Signed)
Labs faxed as requested

## 2018-09-02 DIAGNOSIS — R9082 White matter disease, unspecified: Secondary | ICD-10-CM | POA: Diagnosis not present

## 2018-09-02 DIAGNOSIS — H532 Diplopia: Secondary | ICD-10-CM | POA: Diagnosis not present

## 2018-09-02 DIAGNOSIS — H5500 Unspecified nystagmus: Secondary | ICD-10-CM | POA: Diagnosis not present

## 2018-09-20 DIAGNOSIS — H5010 Unspecified exotropia: Secondary | ICD-10-CM | POA: Diagnosis not present

## 2018-09-26 ENCOUNTER — Ambulatory Visit (INDEPENDENT_AMBULATORY_CARE_PROVIDER_SITE_OTHER): Payer: PPO | Admitting: Family Medicine

## 2018-09-26 ENCOUNTER — Other Ambulatory Visit: Payer: Self-pay

## 2018-09-26 ENCOUNTER — Encounter: Payer: Self-pay | Admitting: Family Medicine

## 2018-09-26 VITALS — BP 130/80 | HR 98 | Temp 98.1°F | Resp 18 | Ht 65.0 in | Wt 175.0 lb

## 2018-09-26 DIAGNOSIS — M25552 Pain in left hip: Secondary | ICD-10-CM

## 2018-09-26 MED ORDER — PREDNISONE 10 MG PO TABS: ORAL_TABLET | ORAL | 0 refills | Status: DC

## 2018-09-26 NOTE — Patient Instructions (Signed)
Follow up as needed or as scheduled We'll call you with your Orthopedic appt START the Prednisone as directed- 3 tabs at the same time x3 days, then 2 tabs x3 days, then 1 tab daily- take w/ food! Call with any questions or concern Hang in there!!!

## 2018-09-26 NOTE — Progress Notes (Signed)
   Subjective:    Patient ID: Laura Stuart, female    DOB: Mar 13, 1946, 72 y.o.   MRN: [id number]  HPI L hip pain- sxs started in August.  Pt was taking Ibuprofen but no improvement.  Pt has been resting.  Pain is in groin.  Most painful w/ stairs.  Some limping when walking.  No radiation of pain.  No pain w/ standing or sitting but has pain w/ movement.   Review of Systems For ROS see HPI     Objective:   Physical Exam  Constitutional: She is oriented to person, place, and time. She appears well-developed and well-nourished. No distress.  HENT:  Head: Normocephalic and atraumatic.  Cardiovascular: Intact distal pulses.  Musculoskeletal:  No TTP over L greater trochanter Full ROM of L hip- flexion, extension, internal/external rotation Pain in groin w/ weight bearing  Neurological: She is alert and oriented to person, place, and time. She displays normal reflexes. No cranial nerve deficit. She exhibits normal muscle tone. Coordination normal.  Skin: Skin is warm and dry.  Vitals reviewed.         Assessment & Plan:  L hip pain- new.  Pt's pain is not reproducible on exam but does occur w/ weight bearing, particularly stairs.  NSAIDs not effective.  Start Prednisone taper and refer to Ortho for complete evaluation/tx.  Pt expressed understanding and is in agreement w/ plan.

## 2018-09-28 ENCOUNTER — Telehealth: Payer: Self-pay | Admitting: Emergency Medicine

## 2018-09-28 NOTE — Telephone Encounter (Signed)
The only oral rinse that I am aware of to help w/ painful outbreaks is viscous lidocaine- swish and spit.  If she would like some, we can send some in

## 2018-09-28 NOTE — Telephone Encounter (Signed)
Please advise 

## 2018-09-28 NOTE — Telephone Encounter (Signed)
Copied from [place] [id number]. Topic: General - Other >> Sep 28, 2018  9:29 AM Valla Leaver wrote: Reason for CRM: Patient wants to know if Dr. 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 She says she was told in the past by a different doctor about some kind of mouth rinse that could help with the pain. Please advise.

## 2018-09-28 NOTE — Telephone Encounter (Signed)
Called pt and advised she stated she would think about it and would return a call to the office.

## 2018-09-29 MED ORDER — LIDOCAINE VISCOUS HCL 2 % MT SOLN: OROMUCOSAL | 1 refills | Status: DC

## 2018-09-29 NOTE — Telephone Encounter (Signed)
Pt is request that this mouth wash be seen to [place], [place]. [phone] (Phone)

## 2018-09-29 NOTE — Addendum Note (Signed)
Addended by: Midge Minium on: 09/29/2018 01:46 PM   Modules accepted: Orders

## 2018-09-29 NOTE — Telephone Encounter (Signed)
Prescription sent to pharmacy.

## 2018-10-06 DIAGNOSIS — M1612 Unilateral primary osteoarthritis, left hip: Secondary | ICD-10-CM | POA: Diagnosis not present

## 2018-10-18 DIAGNOSIS — Z1231 Encounter for screening mammogram for malignant neoplasm of breast: Secondary | ICD-10-CM | POA: Diagnosis not present

## 2018-10-18 LAB — HM MAMMOGRAPHY

## 2018-10-20 ENCOUNTER — Encounter: Payer: Self-pay | Admitting: General Practice

## 2018-11-02 DIAGNOSIS — H2513 Age-related nuclear cataract, bilateral: Secondary | ICD-10-CM | POA: Diagnosis not present

## 2018-11-02 DIAGNOSIS — H532 Diplopia: Secondary | ICD-10-CM | POA: Diagnosis not present

## 2018-11-25 DIAGNOSIS — H509 Unspecified strabismus: Secondary | ICD-10-CM | POA: Diagnosis not present

## 2018-11-28 ENCOUNTER — Telehealth: Payer: Self-pay | Admitting: Nurse Practitioner

## 2018-11-28 NOTE — Telephone Encounter (Signed)
Trigeminal neuralgia generally affects the cheeks lips gums or chin, the V2 and V3 territories.  It is very unusual for the eye to be affected, however if it is seeing an ophthalmologist is the best person to be seen for the double vision.

## 2018-11-28 NOTE — Telephone Encounter (Signed)
I spoke to pt and she is asking if the trigeminal neuralgia that she has causes double vision.  This has been something she has been dealing with for years.   She has seen opthamologist and has been referred to a specialist (for testing).  She will see her opthamologist back in 2/20.    She is asking due to a pamphlet or AVS information she received after her visit that noted if having vision problems to ask her doctor.

## 2018-11-28 NOTE — Telephone Encounter (Signed)
Relayed information below to pt, so she is aware.  Will continue treatment with her opthamologist.

## 2018-11-28 NOTE — Telephone Encounter (Signed)
Pt is asking for a call from RN to discuss concerns about double vision she has been experiencing.Pt has referred to this as a growing situation. Please call

## 2018-12-14 ENCOUNTER — Encounter: Payer: Self-pay | Admitting: Family Medicine

## 2018-12-14 ENCOUNTER — Ambulatory Visit (INDEPENDENT_AMBULATORY_CARE_PROVIDER_SITE_OTHER): Payer: PPO | Admitting: Family Medicine

## 2018-12-14 ENCOUNTER — Other Ambulatory Visit: Payer: Self-pay

## 2018-12-14 VITALS — BP 128/81 | HR 78 | Temp 98.8°F | Resp 17 | Ht 65.0 in | Wt 177.1 lb

## 2018-12-14 DIAGNOSIS — R6889 Other general symptoms and signs: Secondary | ICD-10-CM | POA: Diagnosis not present

## 2018-12-14 DIAGNOSIS — J101 Influenza due to other identified influenza virus with other respiratory manifestations: Secondary | ICD-10-CM

## 2018-12-14 LAB — POCT INFLUENZA A/B
INFLUENZA B, POC: POSITIVE — AB
Influenza A, POC: NEGATIVE

## 2018-12-14 MED ORDER — GUAIFENESIN-CODEINE 100-10 MG/5ML PO SYRP: 10.0000 mL | ORAL_SOLUTION | Freq: Three times a day (TID) | ORAL | 0 refills | Status: DC | PRN

## 2018-12-14 MED ORDER — OSELTAMIVIR PHOSPHATE 75 MG PO CAPS: 75.0000 mg | ORAL_CAPSULE | Freq: Two times a day (BID) | ORAL | 0 refills | Status: DC

## 2018-12-14 NOTE — Patient Instructions (Signed)
Follow up as needed or as scheduled You have the flu START the Tamiflu twice daily Drink plenty of fluids REST! Use the cough syrup as needed- may cause drowsiness Alternate tylenol/ibuprofen as needed for fever/chills/body aches Call with any questions or concerns Hang in there!

## 2018-12-14 NOTE — Progress Notes (Signed)
   Subjective:    Patient ID: Laura Stuart, female    DOB: 07/26/1946, 73 y.o.   MRN: [id number]  HPI sxs started Tuesday at 3:00am, 'I wasn't feeling good'.  Pt is not able to elaborate on this.  + fatigue- 'I wasn't able to do anything that day'.  Now w/ cough- productive.  Drinking plenty of fluids.  'I just want to sleep'.  Mild chills.  Tm 100.8.  + HA, sinus pressure.  No ear pain.  No sore throat.  No known sick contacts.  + sick contact w/ RSV   Review of Systems For ROS see HPI     Objective:   Physical Exam Vitals signs reviewed.  Constitutional:      General: She is not in acute distress.    Appearance: She is well-developed.     Comments: Obviously not feeling well  HENT:     Head: Normocephalic and atraumatic.     Right Ear: Tympanic membrane normal.     Left Ear: Tympanic membrane normal.     Nose: Mucosal edema and rhinorrhea present.     Right Sinus: No maxillary sinus tenderness or frontal sinus tenderness.     Left Sinus: No maxillary sinus tenderness or frontal sinus tenderness.     Mouth/Throat:     Pharynx: Uvula midline. Posterior oropharyngeal erythema present. No oropharyngeal exudate.  Eyes:     Conjunctiva/sclera: Conjunctivae normal.     Pupils: Pupils are equal, round, and reactive to light.  Neck:     Musculoskeletal: Normal range of motion and neck supple.  Cardiovascular:     Rate and Rhythm: Normal rate and regular rhythm.     Heart sounds: Normal heart sounds.  Pulmonary:     Effort: Pulmonary effort is normal. No respiratory distress.     Breath sounds: Normal breath sounds. No wheezing.  Lymphadenopathy:     Cervical: No cervical adenopathy.  Skin:    General: Skin is warm.           Assessment & Plan:  Influenza B- rapid test + and sxs confirm.  Start Tamiflu.  Cough meds prn.  Reviewed supportive care and red flags that should prompt return.  Pt expressed understanding and is in agreement w/ plan.

## 2018-12-22 ENCOUNTER — Encounter: Payer: Self-pay | Admitting: Nurse Practitioner

## 2018-12-28 ENCOUNTER — Ambulatory Visit: Payer: Self-pay

## 2018-12-28 ENCOUNTER — Ambulatory Visit: Payer: Self-pay | Admitting: *Deleted

## 2018-12-28 NOTE — Telephone Encounter (Signed)
Attempted to contact patient on mobile and home number. Left VM on both to return call to office.

## 2018-12-28 NOTE — Telephone Encounter (Signed)
  Patient want to know if she can take Calcium  Magnesium  Instead of Calcium  Cabonate for Bone density purposes Patient awaits  response from  Dr.  Birdie Riddle Reason for Disposition . Caller has URGENT medication question about med that PCP prescribed and triager unable to answer question  Answer Assessment - Initial Assessment Questions 1. SYMPTOMS: "Do you have any symptoms?"     Patient inquires if it would be ok to take  Calcium  Magnesium instead of Calcium carbonate.   2. SEVERITY: If symptoms are present, ask "Are they mild, moderate or severe?"     *No Answer*  Protocols used: MEDICATION QUESTION CALL-A-AH

## 2018-12-30 NOTE — Telephone Encounter (Signed)
Gave patient information. Stated verbal understanding.

## 2018-12-30 NOTE — Telephone Encounter (Signed)
Yes.  She can take Calcium Magnesium for bone health

## 2019-01-02 ENCOUNTER — Other Ambulatory Visit: Payer: Self-pay | Admitting: Neurology

## 2019-01-12 DIAGNOSIS — H2513 Age-related nuclear cataract, bilateral: Secondary | ICD-10-CM | POA: Diagnosis not present

## 2019-01-12 DIAGNOSIS — H40013 Open angle with borderline findings, low risk, bilateral: Secondary | ICD-10-CM | POA: Diagnosis not present

## 2019-02-02 ENCOUNTER — Ambulatory Visit (INDEPENDENT_AMBULATORY_CARE_PROVIDER_SITE_OTHER): Payer: PPO | Admitting: Family Medicine

## 2019-02-02 ENCOUNTER — Other Ambulatory Visit: Payer: Self-pay

## 2019-02-02 ENCOUNTER — Encounter: Payer: Self-pay | Admitting: Family Medicine

## 2019-02-02 VITALS — BP 121/80 | HR 72 | Temp 98.2°F | Resp 16 | Ht 65.0 in | Wt 168.2 lb

## 2019-02-02 DIAGNOSIS — E785 Hyperlipidemia, unspecified: Secondary | ICD-10-CM

## 2019-02-02 DIAGNOSIS — J309 Allergic rhinitis, unspecified: Secondary | ICD-10-CM

## 2019-02-02 DIAGNOSIS — J310 Chronic rhinitis: Secondary | ICD-10-CM | POA: Insufficient documentation

## 2019-02-02 LAB — LIPID PANEL
Cholesterol: 204 mg/dL — ABNORMAL HIGH (ref 0–200)
HDL: 78.5 mg/dL (ref >39.00)
LDL Cholesterol: 108 mg/dL — ABNORMAL HIGH (ref 0–99)
NONHDL: 125.77
Total CHOL/HDL Ratio: 3
Triglycerides: 87 mg/dL (ref 0.0–149.0)
VLDL: 17.4 mg/dL (ref 0.0–40.0)

## 2019-02-02 LAB — HEPATIC FUNCTION PANEL
ALK PHOS: 92 U/L (ref 39–117)
ALT: 10 U/L (ref 0–35)
AST: 16 U/L (ref 0–37)
Albumin: 4.2 g/dL (ref 3.5–5.2)
BILIRUBIN DIRECT: 0.1 mg/dL (ref 0.0–0.3)
TOTAL PROTEIN: 6.4 g/dL (ref 6.0–8.3)
Total Bilirubin: 0.4 mg/dL (ref 0.2–1.2)

## 2019-02-02 LAB — BASIC METABOLIC PANEL
BUN: 13 mg/dL (ref 6–23)
CALCIUM: 9.8 mg/dL (ref 8.4–10.5)
CO2: 30 meq/L (ref 19–32)
Chloride: 103 mEq/L (ref 96–112)
Creatinine, Ser: 0.8 mg/dL (ref 0.40–1.20)
GFR: 70.36 mL/min (ref >60.00)
Glucose, Bld: 86 mg/dL (ref 70–99)
Potassium: 4.7 mEq/L (ref 3.5–5.1)
SODIUM: 138 meq/L (ref 135–145)

## 2019-02-02 MED ORDER — CETIRIZINE HCL 10 MG PO TABS: 10.0000 mg | ORAL_TABLET | Freq: Every day | ORAL | 11 refills | Status: DC

## 2019-02-02 MED ORDER — FLUTICASONE PROPIONATE 50 MCG/ACT NA SUSP: 2.0000 | Freq: Every day | NASAL | 6 refills | Status: DC

## 2019-02-02 NOTE — Assessment & Plan Note (Signed)
Recurrent issue for pt.  Restart daily antihistamine and nasal steroid.  This is the cause of pt's episodic cough.  Discussed dx and tx w/ pt.  Pt expressed understanding and is in agreement w/ plan.

## 2019-02-02 NOTE — Patient Instructions (Signed)
Schedule your complete physical in 6 months We'll notify you of your lab results and make any changes if needed Keep up the good work on healthy diet and regular exercise- you look great! RESTART the Zyrtec (Cetirizine) and Flonase (fluticasone) to improve drainage and cough Drink PLENTY of fluids Call with any questions or concerns Happy Spring!!!

## 2019-02-02 NOTE — Progress Notes (Signed)
   Subjective:    Patient ID: Laura Stuart, female    DOB: 12-23-1945, 73 y.o.   MRN: [id number]  HPI Hyperlipidemia- chronic problem, on Lipitor 10mg  daily.  Pt is down 9 lbs.  Denies CP, SOB, HAs, abd pain, N/V.  'tickle that causes cough'- has been worsening.  Husband has thrown her out of bedroom b/c it's worse at night.  Cough will be episodic during the day.  No fevers.  Some PND.   Review of Systems For ROS see HPI     Objective:   Physical Exam Vitals signs reviewed.  Constitutional:      General: She is not in acute distress.    Appearance: Normal appearance. She is well-developed.  HENT:     Head: Normocephalic and atraumatic.     Right Ear: Tympanic membrane normal.     Left Ear: Tympanic membrane normal.     Nose: Mucosal edema and rhinorrhea present.     Right Sinus: No maxillary sinus tenderness or frontal sinus tenderness.     Left Sinus: No maxillary sinus tenderness or frontal sinus tenderness.     Mouth/Throat:     Pharynx: Posterior oropharyngeal erythema (w/ PND) present.  Eyes:     Conjunctiva/sclera: Conjunctivae normal.     Pupils: Pupils are equal, round, and reactive to light.  Neck:     Musculoskeletal: Normal range of motion and neck supple.  Cardiovascular:     Rate and Rhythm: Normal rate and regular rhythm.     Heart sounds: Normal heart sounds.  Pulmonary:     Effort: Pulmonary effort is normal. No respiratory distress.     Breath sounds: Normal breath sounds. No wheezing or rales.  Lymphadenopathy:     Cervical: No cervical adenopathy.  Neurological:     General: No focal deficit present.     Mental Status: She is alert and oriented to person, place, and time.  Psychiatric:        Mood and Affect: Mood normal.        Behavior: Behavior normal.        Thought Content: Thought content normal.           Assessment & Plan:

## 2019-02-02 NOTE — Assessment & Plan Note (Signed)
Chronic problem.  Applauded pt's recent weight loss.  Currently asymptomatic.  Check labs.  Adjust meds prn

## 2019-02-03 ENCOUNTER — Telehealth: Payer: Self-pay | Admitting: Family Medicine

## 2019-02-03 NOTE — Telephone Encounter (Signed)
Opened by mistake.

## 2019-03-22 ENCOUNTER — Ambulatory Visit (INDEPENDENT_AMBULATORY_CARE_PROVIDER_SITE_OTHER): Payer: PPO | Admitting: Physician Assistant

## 2019-03-22 ENCOUNTER — Encounter: Payer: Self-pay | Admitting: Physician Assistant

## 2019-03-22 ENCOUNTER — Other Ambulatory Visit: Payer: Self-pay

## 2019-03-22 VITALS — Ht 65.0 in | Wt 170.0 lb

## 2019-03-22 DIAGNOSIS — L255 Unspecified contact dermatitis due to plants, except food: Secondary | ICD-10-CM | POA: Diagnosis not present

## 2019-03-22 MED ORDER — TRIAMCINOLONE ACETONIDE 0.5 % EX OINT: 1.0000 "application " | TOPICAL_OINTMENT | Freq: Two times a day (BID) | CUTANEOUS | 0 refills | Status: DC

## 2019-03-22 MED ORDER — PREDNISONE 10 MG PO TABS: ORAL_TABLET | ORAL | 0 refills | Status: DC

## 2019-03-22 NOTE — Progress Notes (Signed)
I have discussed the procedure for the virtual visit with the patient who has given consent to proceed with assessment and treatment.   Stephaine Breshears, CMA     

## 2019-03-22 NOTE — Progress Notes (Signed)
Virtual Visit via Video   I connected with patient on 03/22/19 at  3:45 PM EDT by a video enabled telemedicine application and verified that I am speaking with the correct person using two identifiers.  Location patient: Home Location provider: [facility], Office Persons participating in the virtual visit: Patient, Provider, [place] ([place])  I discussed the limitations of evaluation and management by telemedicine and the availability of in person appointments. The patient expressed understanding and agreed to proceed.  Subjective:   HPI:   Patient presents today via [facility] c/o pruritic rash first noted on Sunday. Notes rash is blistering and itchy. Was first on her left wrist. Now has moved  To arms bilaterally and left thigh. Within the past 2 days has moved to her left torso in a large area. This morning notes having lesions on the R-side of her neck. Denies fever, chills, malaise. Has been applying rubbing alcohol, Cortisone cream not helping at all. .  ROS:   See pertinent positives and negatives per HPI.  Patient Active Problem List   Diagnosis Date Noted  . Rhinitis 02/02/2019  . Hyperlipidemia 08/24/2017  . Vitamin D deficiency 08/24/2017  . TIA (transient ischemic attack) 02/26/2017  . 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 Irregular bowel habits 08/20/2015  . Knee pain, right 03/11/2015  . Sleep apnea with use of continuous positive airway pressure (CPAP) 07/26/2013  . Insomnia 05/04/2013  . Screening for malignant neoplasm of the cervix 10/29/2011  . Trigeminal neuralgia 07/07/2011  . Overweight 07/07/2011  . Osteopenia 07/07/2011  . Routine general medical examination at a health care facility 07/07/2011    Social History   Tobacco Use  . Smoking status: Never Smoker  . Smokeless tobacco: Never Used  Substance Use Topics  . Alcohol use: Yes    Alcohol/week: 0.0 standard drinks    Comment: 1 glass every 2 months; socially     Current Outpatient Medications:  .  atorvastatin (LIPITOR) 10 MG tablet, TAKE 1 TABLET BY MOUTH DAILY AT 6 PM, Disp: 90 tablet, Rfl: 1 .  Calcium Carb-Cholecalciferol (CALCIUM CARBONATE-VITAMIN D3 PO), Take 1 tablet by mouth daily. , Disp: , Rfl:  .  carbamazepine (TEGRETOL) 200 MG tablet, TAKE 1 TABLET BY MOUTH WITH BREAKFAST, 1AT LUNCH AND 2 AT BEDTIME, Disp: 360 tablet, Rfl: 3 .  cetirizine (ZYRTEC) 10 MG tablet, Take 1 tablet (10 mg total) by mouth daily., Disp: 30 tablet, Rfl: 11 .  fluticasone (FLONASE) 50 MCG/ACT nasal spray, Place 2 sprays into both nostrils daily., Disp: 16 g, Rfl: 6 .  zolpidem (AMBIEN) 10 MG tablet, TAKE 1/2 TO 1 TABLET BY MOUTH AT BEDTIMEAS NEEDED FOR SLEEP, Disp: 30 tablet, Rfl: 2  Allergies  Allergen Reactions  . Penicillins Other (See Comments)    From allergy testing, unsure if allergy exists    Objective:   Ht 5\' 5"  (1.651 m)   Wt 170 lb (77.1 kg)   BMI 28.29 kg/m   Patient is well-developed, well-nourished in no acute distress.  Resting comfortably at home.  Head is normocephalic, atraumatic.  No labored breathing.  Speech is clear and coherent with logical contest.  Patient is alert and oriented at baseline.  On skin examination, erythematous papulovesicular lesions noted of lateral left hand, including dorsal thumb. Is overall linear but with areas of large confluence. Similar lesions noted of R anterior neck and arms bilaterally. The biggest area is of left thigh but patient unable to fully show via video visit.  Assessment and Plan:     1. Rhus dermatitis Multiple sites, very significant of left thigh and torso. Will start Kenalog 0.5% BID along with supportive measures. Will watch closely. If not improving or worsening in any way, will need to start the steroid taper, taking as directed. Discussed risk versus benefit of oral steroids during current time. She voiced understanding. Thankfully is low risk for COVID currently but is 72 so higher  risk of complications compared to younger individual. Instructions sent to [facility].   - triamcinolone ointment (KENALOG) 0.5 %; Apply 1 application topically 2 (two) times daily.  Dispense: 30 g; Refill: 0 - predniSONE (DELTASONE) 10 MG tablet; Take 4 tablet PO x 3 days, then 3 tab PO x 3 days, then 2 tab x 3 days, then 1 tab x 3 days  Dispense: 30 tablet; Refill: 0    Leeanne Rio, [place] 03/22/2019

## 2019-03-22 NOTE — Patient Instructions (Signed)
Instructions sent to MyChart.  Please keep skin clean and dry.  Apply the Kenalog cream twice daily to the areas of concern -- do not apply to face or groin Witch hazel astringent and cool compresses will help with the itch as well. If not improving in 48 hours or if anything worsens, please start the prednisone taper as directed. If not improving with this, then we will need to see you in the office.   Don't hesitate to message or call me with any new or changing symptoms.  Take care!

## 2019-03-24 ENCOUNTER — Encounter: Payer: Self-pay | Admitting: Physician Assistant

## 2019-04-03 DIAGNOSIS — G4733 Obstructive sleep apnea (adult) (pediatric): Secondary | ICD-10-CM | POA: Diagnosis not present

## 2019-04-10 ENCOUNTER — Telehealth: Payer: Self-pay | Admitting: Neurology

## 2019-04-10 ENCOUNTER — Encounter: Payer: Self-pay | Admitting: Neurology

## 2019-04-10 ENCOUNTER — Other Ambulatory Visit: Payer: Self-pay

## 2019-04-10 ENCOUNTER — Ambulatory Visit (INDEPENDENT_AMBULATORY_CARE_PROVIDER_SITE_OTHER): Payer: PPO | Admitting: Neurology

## 2019-04-10 DIAGNOSIS — G459 Transient cerebral ischemic attack, unspecified: Secondary | ICD-10-CM

## 2019-04-10 DIAGNOSIS — G500 Trigeminal neuralgia: Secondary | ICD-10-CM

## 2019-04-10 DIAGNOSIS — B0222 Postherpetic trigeminal neuralgia: Secondary | ICD-10-CM

## 2019-04-10 DIAGNOSIS — M8580 Other specified disorders of bone density and structure, unspecified site: Secondary | ICD-10-CM | POA: Diagnosis not present

## 2019-04-10 DIAGNOSIS — H532 Diplopia: Secondary | ICD-10-CM | POA: Diagnosis not present

## 2019-04-10 DIAGNOSIS — Z760 Encounter for issue of repeat prescription: Secondary | ICD-10-CM | POA: Diagnosis not present

## 2019-04-10 DIAGNOSIS — G4730 Sleep apnea, unspecified: Secondary | ICD-10-CM

## 2019-04-10 MED ORDER — GABAPENTIN 300 MG PO CAPS: 300.0000 mg | ORAL_CAPSULE | Freq: Three times a day (TID) | ORAL | 11 refills | Status: DC

## 2019-04-10 NOTE — Patient Instructions (Signed)
Trigeminal Neuralgia    Trigeminal neuralgia is a nerve disorder that causes attacks of severe facial pain. The attacks last from a few seconds to several minutes. They can happen for days, weeks, or months and then go away for months or years. Trigeminal neuralgia is also called tic douloureux.  What are the causes?  This condition is caused by damage to a nerve in the face that is called the trigeminal nerve. An attack can be triggered by:  · Talking.  · Chewing.  · Putting on makeup.  · Washing your face.  · Shaving your face.  · Brushing your teeth.  · Touching your face.  What increases the risk?  This condition is more likely to develop in:  · Women.  · People who are 50 years of age or older.  What are the signs or symptoms?  The main symptom of this condition is pain in the jaw, lips, eyes, nose, scalp, forehead, and face. The pain may be intense, stabbing, electric, or shock-like.  How is this diagnosed?  This condition is diagnosed with a physical exam. A CT scan or MRI may be done to rule out other conditions that can cause facial pain.  How is this treated?  This condition may be treated with:  · Avoiding the things that trigger your attacks.  · Pain medicine.  · Surgery. This may be done in severe cases if other medical treatment does not provide relief.  Follow these instructions at home:  · Take over-the-counter and prescription medicines only as told by your health care provider.  · If you wish to get pregnant, talk with your health care provider before you start trying to get pregnant.  · Avoid the things that trigger your attacks. It may help to:  ? Chew on the unaffected side of your mouth.  ? Avoid touching your face.  ? Avoid blasts of hot or cold air.  Contact a health care provider if:  · Your pain medicine is not helping.  · You develop new, unexplained symptoms, such as:  ? Double vision.  ? Facial weakness.  ? Changes in hearing or balance.  · You become pregnant.  Get help right away  if:  · Your pain is unbearable, and your pain medicine does not help.  This information is not intended to replace advice given to you by your health care provider. Make sure you discuss any questions you have with your health care provider.  Document Released: 11/13/2000 Document Revised: 10/15/2017 Document Reviewed: 03/11/2015  Elsevier Interactive Patient Education © 2019 [employer]

## 2019-04-10 NOTE — Telephone Encounter (Signed)
Called the patient to inform them that our office has placed new protocols in place for our office visits. Due to Covid 19 our office is reducing our number of office visits in order to minimize the risk to our patients and healthcare providers.Our office is now providing the capability to offer the patients virtual visits at this time. Informed of what that process looks like and informed that the Virtual visit will still be billed through insurance as such. Due to [place],informed the patient since the appointment is taking place over the phone/internet app, we can't guarantee the security of the phone line. With that said if we do move forward I would have to get verbal consent to complete the Video Visit/Phone call. Patient gave verbal consent to move forward with the video visit. I have reviewed the patient's chart and made sure that everything is up to date. Patient is also made aware that since this is a video visit we are able to complete the visit but a physical exam is not able to be done since the patient is not present in person. Pt request the link be texted/emailed to them. Pt informed that the front staff will contact the patient aprox 30 minutes prior to the scheduled appointment to "check them in" and make sure that everything is ready for the appointment to get started. Pt verbalized understanding of this information and will states to be ready for the visit at least 15-30 min prior to the visit. Reminded the patient once more that this is treated as a Office visit and the patient must be prepared for the visit and ready at the time of their appointment preferably in a well lit area where they have good connection for the visit. Pt verbalized understanding.

## 2019-04-10 NOTE — Addendum Note (Signed)
Addended by: Larey Seat on: 04/10/2019 05:11 PM   Modules accepted: Orders

## 2019-04-10 NOTE — Progress Notes (Signed)
Virtual Visit via Video Note  I connected with Laura Stuart on 04/10/19 at  3:00 PM EDT by a video enabled telemedicine application and verified that I am speaking with the correct person using two identifiers.  Location: Patient: at home  Provider:  At home office    I discussed the limitations of evaluation and management by telemedicine and the availability of in person appointments. The patient expressed understanding and agreed to proceed.  History of Present Illness: trigeminal Neuralgia patient - affected is the right medium branch. New onset strabismus, diplopia, and declining visual acuity.   The patient reported that she was unable to sleep on the sixth, seventh, 8 May her pain was increasing throughout the ninth and by 10 May which would be yesterday, Mother's Day 2020, and was a full blown 10 out of 10 pain all day.  She could hardly eat she could hardly speak at all this aggravated the right middle branch trigeminal neuralgia.  She also had tearing in her eye.  She reports lots of eye trouble diplopia, needing a magnifying glass, there is visible Ezel tropia her right eye is unable to converge in proximity.  She also reports that she feels the diplopia was in one eye.  She had a previous spell of diplopia in 2018 January but now feels that this is unusually bad exhausting to walk exhausted to try to read.  She had seen for optometrist before she saw a ophthalmologist who told her that she may have early cataract related abnormal vision in the right eye and plan surgery from May 20th.        [facility]  PATIENT: Laura Stuart DOB: 29-May-1946   REASON FOR VISIT: Follow-up for trigeminal neuralgia, obstructive sleep apnea, questionable TIA HISTORY FROM: Patient Mr Westergard, a 73 year old female, presents here for CPAP compliance but no download is available. She brought her machine to [facility] just yesterday and was told the download would be here.  It  wasn't our visit it short. Marland Kitchen   HISTORY OF PRESENT ILLNESS:UPDATE 04/09/2018CM  Ms. Avey, 73 year old female returns for follow-up. She has a history of trigeminal neuralgia and obstructive sleep apnea. Her Tegretol was increased at her last visit March 8th. She had hospital admission on 02/26/2017 for abnormal movements and weakness. She had been on a road trip with her family, diagnosed in an urgent care with strep throat started on antibiotics. Today she returned home she read in the car with a family member who had been diagnosed with the flu. The following morning she was extremely weak and could not walk and she had trouble speaking family brought her to the emergency room. MRI of the brain MRA of the brain no acute stroke seen. Mild chronic small vessel ischemic changes noted. 2-D echo ejection fraction was normal carotid Doppler unremarkable.  She remains on aspirin for secondary stroke prevention without further stroke or TIA symptoms. She has no bruising or bleeding. She remains on Lipitor  for hyperlipidemia. LDL 85. She denies any myalgias. She returns for reevaluation  Interval history from 02/04/2017,CD Mrs. Masden reports that the trigeminal neuralgia has worsened and the area that  is affected is enlarged. It involves an area over the upper lip of the right side of the face as well as above the right eyebrow radiating slightly towards the temple not reaching the retroauricular region or the maxillary area. She was treated on Gabapentin  before she changed to Tegretol.  I will increase the  dose. The  last  " trigeminal attack"  was 12 years ago.   Social history- of [place] descent, 2 daughters - 4 grandchildren.   REVIEW OF SYSTEMS: Full 14 system review of systems performed and notable only for those listed, all others are neg:  Fatigue severity scale was endorsed at 12 points, Epworth Sleepiness Scale at only 2 points, the geriatric depression score at only 1 out of 15 points.  ALLERGIES:  Allergies  Allergen Reactions  . Penicillins Other (See Comments)    From allergy testing, unsure if allergy exists    HOME MEDICATIONS: Outpatient Medications Prior to Visit  Medication Sig Dispense Refill  . atorvastatin (LIPITOR) 10 MG tablet TAKE 1 TABLET BY MOUTH DAILY AT 6 PM 90 tablet 1  . Calcium Carb-Cholecalciferol (CALCIUM CARBONATE-VITAMIN D3 PO) Take 1 tablet by mouth daily.     . carbamazepine (TEGRETOL) 200 MG tablet TAKE 1 TABLET BY MOUTH WITH BREAKFAST, 1AT LUNCH AND 2 AT BEDTIME 360 tablet 3  . cetirizine (ZYRTEC) 10 MG tablet Take 1 tablet (10 mg total) by mouth daily. 30 tablet 11  . fluticasone (FLONASE) 50 MCG/ACT nasal spray Place 2 sprays into both nostrils daily. 16 g 6  . triamcinolone ointment (KENALOG) 0.5 % Apply 1 application topically 2 (two) times daily. 30 g 0  . zolpidem (AMBIEN) 10 MG tablet TAKE 1/2 TO 1 TABLET BY MOUTH AT BEDTIMEAS NEEDED FOR SLEEP 30 tablet 2   No facility-administered medications prior to visit.     PAST MEDICAL HISTORY: Past Medical History:  Diagnosis Date  . Allergy   . Atypical facial pain   . Hyperlipidemia 08/24/2017  . Neuralgia    Trigeminal  . Sleep apnea    using cpap,OSA  . Trigeminal neuralgia     PAST SURGICAL HISTORY: Past Surgical History:  Procedure Laterality Date  . FOOT SURGERY Left 05/07/2017  . ROOT CANAL  06/2013  . toes right foot  2010  . TONSILLECTOMY AND ADENOIDECTOMY  1950  . torn miniscus  2003  . VAGINAL DELIVERY     X2    FAMILY HISTORY: Family History  Problem Relation Age of Onset  . Stroke Mother   . Brain cancer Mother   . Lung cancer Mother   . Heart attack Mother   . Emphysema Father   . Alzheimer's disease Sister   . Dementia Other        evident after a fall with a hip fracture  . Colon cancer Neg Hx     SOCIAL HISTORY: Social History   Socioeconomic History  . Marital status: Married    Spouse name: [name]  . Number of children: 2  . Years of  education: Grad sch.  . 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 Financial resource strain: Not on file  . Food insecurity:    Worry: Not on file    Inability: Not on file  . Transportation needs:    Medical: Not on file    Non-medical: Not on file  Tobacco Use  . Smoking status: Never Smoker  . Smokeless tobacco: Never Used  Substance and Sexual Activity  . Alcohol use: Yes    Alcohol/week: 0.0 standard drinks    Comment: 1 glass every 2 months; socially  . Drug use: No  . Sexual activity: Not on file  Lifestyle  .  Physical activity:    Days per week: Not on file    Minutes per session: Not on file  . Stress: Not on file  Relationships  . Social connections:    Talks on phone: Not on file    Gets together: Not on file    Attends religious service: Not on file    Active member of club or organization: Not on file    Attends meetings of clubs or organizations: Not on file    Relationship status: Not on file  . Intimate partner violence:    Fear of current or ex partner: Not on file    Emotionally abused: Not on file    Physically abused: Not on file    Forced sexual activity: Not on file  Other Topics Concern  . Not on file  Social History Narrative   Patient is married [name]) and lives at home with her husband.   Patient has two adult children.   Patient is retired.   Patient has a Production designer, theatre/television/film.   Patient is right-handed.   Patient drinks 1 1/2 cups of coffee daily.     Observations/Objective:   There were no vitals filed for this visit. There is no height or weight on file to calculate BMI.  Generalized: Well developed, well groomed.  - the patient looks visibly aged and appears to be in pain.   Head: normocephalic and atraumatic,. Oropharynx benign  Neck: normal ROM  Cardiac: no distended neck veins.  Musculoskeletal:  No deformity   Neurological examination   Mentation: Alert oriented to time, place, history taking.  Attention span and concentration appropriate. Recent and remote memory intact. Follows all commands speech and language fluent.  Cranial nerve: Taste and smell reportedly preserved, Pupils were equal round - I noted the right eye to not converge properly and there is a new strabismus, corresponding diplopia symptoms. No ptosis, full upward and downward gaze, but the right eye is " lazy" as the patient noted. Extraocular movements were not normal on confrontational test.  Facial strength was normal.  Facial sensation was described - painful medial trigeminal branch distribution of electric , severe pain sensation on the right face, tearing .  hearing appeared intact . Uvula and tongue in midline. Tongue protrusion into cheek  was normal. Motor:  ROM normal. walking without difficulty. Tandem gait could not be visualized.     ROS: The patient reported that she was unable to sleep on the sixth, seventh, 8 May her pain was increasing throughout the ninth and by 10 May which would be yesterday, Mother's Day 2020, and was a full blown 10 out of 10 pain all day.  She could hardly eat she could hardly speak at all this aggravated the right middle branch trigeminal neuralgia.  She also had tearing in her eye.  She reports lots of eye trouble diplopia, needing a magnifying glass, there is visible Ezel tropia her right eye is unable to converge in proximity.  She also reports that she feels the diplopia was in one eye.  She had a previous spell of diplopia in 2018 January but now feels that this is unusually bad exhausting to walk exhausted to try to read.  She had seen for optometrist before she saw a ophthalmologist who told her that she may have early cataract related abnormal vision in the right eye and plan surgery from May 20th.    DIAGNOSTIC DATA (LABS, IMAGING, TESTING)  Patient reportedly had a  brain scan  and Myasthenia ab tests, I am unable to find in EPIC.  - I reviewed patient records, labs, notes, testing and imaging myself where available.  Lab Results  Component Value Date   WBC 5.5 08/04/2018   HGB 12.6 08/04/2018   HCT 37.1 08/04/2018   MCV 90.3 08/04/2018   PLT 226.0 08/04/2018      Component Value Date/Time   NA 138 02/02/2019 0951   NA 144 06/29/2017 0959   K 4.7 02/02/2019 0951   CL 103 02/02/2019 0951   CO2 30 02/02/2019 0951   GLUCOSE 86 02/02/2019 0951   BUN 13 02/02/2019 0951   BUN 19 06/29/2017 0959   CREATININE 0.80 02/02/2019 0951   CREATININE 0.79 02/05/2013 1117   CALCIUM 9.8 02/02/2019 0951   PROT 6.4 02/02/2019 0951   PROT 6.4 06/29/2017 0959   ALBUMIN 4.2 02/02/2019 0951   ALBUMIN 4.2 06/29/2017 0959   AST 16 02/02/2019 0951   ALT 10 02/02/2019 0951   ALKPHOS 92 02/02/2019 0951   BILITOT 0.4 02/02/2019 0951   BILITOT <0.2 06/29/2017 0959   GFRNONAA 77 06/29/2017 0959   GFRAA 88 06/29/2017 0959   Lab Results  Component Value Date   CHOL 204 (H) 02/02/2019   HDL 78.50 02/02/2019   LDLCALC 108 (H) 02/02/2019   LDLDIRECT 106.9 05/22/2013   TRIG 87.0 02/02/2019   CHOLHDL 3 02/02/2019   Lab Results  Component Value Date   HGBA1C 5.5 02/27/2017    Lab Results  Component Value Date   TSH 2.99 08/04/2018      ASSESSMENT AND PLAN  73 y.o. year old female  has a past medical history of Allergy, Atypical facial pain, Hyperlipidemia (08/24/2017), Neuralgia, Sleep apnea, and Trigeminal neuralgia.  New onset diplopia and loss of visual acuity, rapidly over 4-6 month.  Patient has needed a magnifying glass to read the online chat   20, 2020. A cataract surgery was scheduled for May   NO Recent hospital admission   Follow Up Instructions: Mrs. Kyung Bacca Vandewater reported that she had undergone extensive laboratory testing including a tests that involved 3 parts which I believe may have been a myasthenia gravis antibody test.  She  states the test was negative but I need to review it.  I also want to obtain a Tegretol level free and bound and her metabolism level since she took recently some herbal supplement days and multivitamins that may help affected how Tegretol was metabolized.    If myathenia antibodies were not yet drawn I will definitely order them,  I will order a sodium level and a white blood cell count as well with differential.    I would like for the patient to consider a higher dose of Tegretol should her metabolism be unaffected.  If Tegretol cannot be increased any further, I would return to gabapentin and from here on sent the patient for a gamma knife surgey to [facility].    I would like for her to have a revisit in the next 4 weeks- Np - there should be a face-to-face visit with the nurse practitioner , alternating with me every 6 months until the pain is controlled.    I discussed the assessment and treatment plan with the patient. The patient was provided an opportunity to ask questions and all were answered. The patient agreed with the plan and demonstrated an understanding of the instructions.   The patient was advised to call back or seek an in-person evaluation  if the symptoms worsen or if the condition fails to improve as anticipated.  I provided 28 minutes of non-face-to-face time during this encounter.   Larey Seat, MD  Larey Seat, MD   [facility] [address] [place], [place] [address] [phone]

## 2019-04-10 NOTE — Telephone Encounter (Signed)
Patient called and stated that her trigeminal neuralgia has gotten much worse within the last few days and she feels that she needs to be seen earlier than her May 18th apt. I explained to the patient our current policy due to [id number]. She would like to speak with the nurse regarding what she should do from here because she needs some relief. Please call and advise at [phone].

## 2019-04-11 ENCOUNTER — Other Ambulatory Visit: Payer: Self-pay

## 2019-04-11 ENCOUNTER — Other Ambulatory Visit (INDEPENDENT_AMBULATORY_CARE_PROVIDER_SITE_OTHER): Payer: Self-pay

## 2019-04-11 DIAGNOSIS — M8580 Other specified disorders of bone density and structure, unspecified site: Secondary | ICD-10-CM

## 2019-04-11 DIAGNOSIS — Z760 Encounter for issue of repeat prescription: Secondary | ICD-10-CM | POA: Diagnosis not present

## 2019-04-11 DIAGNOSIS — G500 Trigeminal neuralgia: Secondary | ICD-10-CM | POA: Diagnosis not present

## 2019-04-11 DIAGNOSIS — Z0289 Encounter for other administrative examinations: Secondary | ICD-10-CM

## 2019-04-11 DIAGNOSIS — G459 Transient cerebral ischemic attack, unspecified: Secondary | ICD-10-CM | POA: Diagnosis not present

## 2019-04-12 ENCOUNTER — Encounter: Payer: Self-pay | Admitting: Neurology

## 2019-04-12 ENCOUNTER — Telehealth: Payer: Self-pay | Admitting: Neurology

## 2019-04-12 MED ORDER — TRAMADOL HCL 50 MG PO TABS: 100.0000 mg | ORAL_TABLET | Freq: Four times a day (QID) | ORAL | 0 refills | Status: DC | PRN

## 2019-04-12 NOTE — Telephone Encounter (Signed)
pt called in and stated she is still in lots of pain and wants to know how long she has to wait for the meds to take effect

## 2019-04-12 NOTE — Telephone Encounter (Signed)
Replied to the pt through [employer].

## 2019-04-12 NOTE — Telephone Encounter (Signed)
May take three days- does she want some Tramadol for the acute pain now? I find that justified for trigeminal neuralgia.  Also needs referral for [facility].

## 2019-04-17 ENCOUNTER — Encounter: Payer: Self-pay | Admitting: Neurology

## 2019-04-17 ENCOUNTER — Ambulatory Visit: Payer: PPO | Admitting: Neurology

## 2019-04-17 DIAGNOSIS — G4730 Sleep apnea, unspecified: Secondary | ICD-10-CM

## 2019-04-17 DIAGNOSIS — G500 Trigeminal neuralgia: Secondary | ICD-10-CM

## 2019-04-17 DIAGNOSIS — G459 Transient cerebral ischemic attack, unspecified: Secondary | ICD-10-CM

## 2019-04-17 DIAGNOSIS — M8580 Other specified disorders of bone density and structure, unspecified site: Secondary | ICD-10-CM

## 2019-04-17 MED ORDER — TRAMADOL HCL 50 MG PO TABS: 100.0000 mg | ORAL_TABLET | Freq: Four times a day (QID) | ORAL | 0 refills | Status: DC | PRN

## 2019-04-17 MED ORDER — GABAPENTIN 300 MG PO CAPS: 300.0000 mg | ORAL_CAPSULE | Freq: Four times a day (QID) | ORAL | 5 refills | Status: DC

## 2019-04-17 MED ORDER — GABAPENTIN 300 MG PO CAPS: 300.0000 mg | ORAL_CAPSULE | Freq: Three times a day (TID) | ORAL | 11 refills | Status: DC

## 2019-04-20 LAB — CBC WITH DIFFERENTIAL/PLATELET
Basophils Absolute: 0 10*3/uL (ref 0.0–0.2)
Basos: 1 %
EOS (ABSOLUTE): 0.1 10*3/uL (ref 0.0–0.4)
Eos: 2 %
Hematocrit: 38.1 % (ref 34.0–46.6)
Hemoglobin: 12.6 g/dL (ref 11.1–15.9)
Immature Grans (Abs): 0 10*3/uL (ref 0.0–0.1)
Immature Granulocytes: 0 %
Lymphocytes Absolute: 1.1 10*3/uL (ref 0.7–3.1)
Lymphs: 25 %
MCH: 31.7 pg (ref 26.6–33.0)
MCHC: 33.1 g/dL (ref 31.5–35.7)
MCV: 96 fL (ref 79–97)
Monocytes Absolute: 0.4 10*3/uL (ref 0.1–0.9)
Monocytes: 9 %
Neutrophils Absolute: 2.7 10*3/uL (ref 1.4–7.0)
Neutrophils: 63 %
Platelets: 257 10*3/uL (ref 150–450)
RBC: 3.98 x10E6/uL (ref 3.77–5.28)
RDW: 13.3 % (ref 11.7–15.4)
WBC: 4.4 10*3/uL (ref 3.4–10.8)

## 2019-04-20 LAB — CARBAMAZEPINE LEVEL, TOTAL: Carbamazepine (Tegretol), S: 6.2 ug/mL (ref 4.0–12.0)

## 2019-04-20 LAB — COMPREHENSIVE METABOLIC PANEL
ALT: 8 IU/L (ref 0–32)
AST: 16 IU/L (ref 0–40)
Albumin/Globulin Ratio: 2 (ref 1.2–2.2)
Albumin: 4.3 g/dL (ref 3.7–4.7)
Alkaline Phosphatase: 84 IU/L (ref 39–117)
BUN/Creatinine Ratio: 18 (ref 12–28)
BUN: 14 mg/dL (ref 8–27)
Bilirubin Total: 0.2 mg/dL (ref 0.0–1.2)
CO2: 23 mmol/L (ref 20–29)
Calcium: 9.3 mg/dL (ref 8.7–10.3)
Chloride: 102 mmol/L (ref 96–106)
Creatinine, Ser: 0.76 mg/dL (ref 0.57–1.00)
GFR calc Af Amer: 91 mL/min/{1.73_m2} (ref >59)
GFR calc non Af Amer: 79 mL/min/{1.73_m2} (ref >59)
Globulin, Total: 2.2 g/dL (ref 1.5–4.5)
Glucose: 97 mg/dL (ref 65–99)
Potassium: 5.1 mmol/L (ref 3.5–5.2)
Sodium: 139 mmol/L (ref 134–144)
Total Protein: 6.5 g/dL (ref 6.0–8.5)

## 2019-04-20 LAB — CARBAMAZEPINE-10,11 EPOXIDE: Carbamazepine 10,11 Epoxy: 1.9 ug/mL (ref 0.2–2.0)

## 2019-04-25 ENCOUNTER — Encounter: Payer: Self-pay | Admitting: Neurology

## 2019-04-29 ENCOUNTER — Other Ambulatory Visit: Payer: Self-pay | Admitting: Neurology

## 2019-05-01 ENCOUNTER — Telehealth: Payer: Self-pay | Admitting: Neurology

## 2019-05-01 ENCOUNTER — Encounter: Payer: Self-pay | Admitting: Neurology

## 2019-05-01 ENCOUNTER — Other Ambulatory Visit: Payer: Self-pay | Admitting: Neurology

## 2019-05-01 MED ORDER — TRAMADOL HCL 100 MG PO TABS: 50.0000 mg | ORAL_TABLET | Freq: Four times a day (QID) | ORAL | 0 refills | Status: DC | PRN

## 2019-05-01 NOTE — Telephone Encounter (Signed)
Discussed with Dr Brett Fairy and she was fine with refilling for the patient. I have refilled and will send to the pharmacy. She is only willing to continue this until we can get the pt in with gamma knife procedure. Appears this was sent on may 19th. to [facility].

## 2019-05-01 NOTE — Telephone Encounter (Signed)
Pt has called asking if she will be able to get a full refill of her traMADol (ULTRAM) 50 MG tablet.  Pt states she can not wait until 06-03 to get her medication

## 2019-05-02 ENCOUNTER — Telehealth: Payer: Self-pay | Admitting: *Deleted

## 2019-05-02 ENCOUNTER — Encounter: Payer: Self-pay | Admitting: Adult Health

## 2019-05-02 NOTE — Telephone Encounter (Signed)
LMVM for pt that per MM/NP after reviewing your chart ? About needing to come in for in office visit, as has been emailing back and forth regarding concerns.  Has appt 05-19-19 at [facility] gamma knife eval.  Asked for pt to call back and if ok to cancel or if needed tweaking of medications may do over phone or thru email.

## 2019-05-03 ENCOUNTER — Ambulatory Visit: Payer: Self-pay | Admitting: Adult Health

## 2019-05-03 NOTE — Telephone Encounter (Signed)
Pt called this am. She did not feel the need to come in this am.   I relayed that per MM/NP that swelling in feet could be SE of gabapentin.  (pt sated that started with her ankles, then feet, lower leg, top of foot tender)  Worsening since increased dosing of gabapentin from 900 to 1200mg  daily ??  The issue with trouble swallowing started prior to taking gabapentin.  (has to think about it, ok with drinking water, has dry mouth). This to be address by pcp.  She willl monitor both  of these things right now and continue to email [place] as needed.  She will elevated feet and see if this helps.  Has appt with gamma knife eval 04-18-19.

## 2019-05-08 ENCOUNTER — Encounter: Payer: Self-pay | Admitting: Family Medicine

## 2019-05-13 ENCOUNTER — Other Ambulatory Visit: Payer: Self-pay | Admitting: Family Medicine

## 2019-05-19 ENCOUNTER — Encounter: Payer: Self-pay | Admitting: Neurology

## 2019-05-19 ENCOUNTER — Encounter: Payer: Self-pay | Admitting: Family Medicine

## 2019-05-19 DIAGNOSIS — G500 Trigeminal neuralgia: Secondary | ICD-10-CM | POA: Diagnosis not present

## 2019-05-22 ENCOUNTER — Encounter: Payer: Self-pay | Admitting: Neurology

## 2019-05-22 ENCOUNTER — Other Ambulatory Visit: Payer: Self-pay | Admitting: Neurology

## 2019-05-22 MED ORDER — GABAPENTIN 300 MG PO CAPS: ORAL_CAPSULE | ORAL | 5 refills | Status: DC

## 2019-06-29 ENCOUNTER — Ambulatory Visit: Payer: PPO | Admitting: Nurse Practitioner

## 2019-07-03 ENCOUNTER — Ambulatory Visit (INDEPENDENT_AMBULATORY_CARE_PROVIDER_SITE_OTHER): Payer: PPO | Admitting: Adult Health

## 2019-07-03 ENCOUNTER — Other Ambulatory Visit: Payer: Self-pay

## 2019-07-03 ENCOUNTER — Encounter: Payer: Self-pay | Admitting: Adult Health

## 2019-07-03 VITALS — BP 126/80 | HR 62 | Temp 98.2°F | Ht 65.0 in | Wt 176.4 lb

## 2019-07-03 DIAGNOSIS — G4700 Insomnia, unspecified: Secondary | ICD-10-CM | POA: Diagnosis not present

## 2019-07-03 DIAGNOSIS — G500 Trigeminal neuralgia: Secondary | ICD-10-CM | POA: Diagnosis not present

## 2019-07-03 MED ORDER — ZOLPIDEM TARTRATE 10 MG PO TABS: ORAL_TABLET | ORAL | 1 refills | Status: DC

## 2019-07-03 NOTE — Progress Notes (Signed)
PATIENT: Laura Stuart DOB: Dec 24, 1945  REASON FOR VISIT: follow up HISTORY FROM: patient  HISTORY OF PRESENT ILLNESS: Today 07/03/19:  Laura Stuart is a 73 year old female with a history of trigeminal neuralgia.  She returns today for follow-up.  She is currently taking gabapentin 300 mg 1 tablet 5 times a day.  She reports that this is controlling her pain but she still has discomfort throughout the day.  She did have a consultation at [facility] for gamma knife procedure as well as surgery.  She states that she has decided to pursue the surgical option and is waiting to be scheduled.  In the past she has tried Tegretol but it interfered with her vision.  She states that since she started gabapentin she has noticed a fine tremor in the hands.  She also noticed that she has a hard time holding onto objects.  She is also on Ambien to help with insomnia.  HISTORY  04/10/19 (Copied from Dr.Dohmeier's note) The patient reported that she was unable to sleep on the sixth, seventh, 8 May her pain was increasing throughout the ninth and by 10 May which would be yesterday, Mother's Day 2020, and was a full blown 10 out of 10 pain all day.  She could hardly eat she could hardly speak at all this aggravated the right middle branch trigeminal neuralgia.  She also had tearing in her eye.  She reports lots of eye trouble diplopia, needing a magnifying glass, there is visible Ezel tropia her right eye is unable to converge in proximity.  She also reports that she feels the diplopia was in one eye.  She had a previous spell of diplopia in 2018 January but now feels that this is unusually bad exhausting to walk exhausted to try to read.  She had seen for optometrist before she saw a ophthalmologist who told her that she may have early cataract related abnormal vision in the right eye and plan surgery from May 20th.  REVIEW OF SYSTEMS: Out of a complete 14 system review of symptoms, the patient complains  only of the following symptoms, and all other reviewed systems are negative.  See HPI  ALLERGIES: Allergies  Allergen Reactions  . Penicillins Other (See Comments)    From allergy testing, unsure if allergy exists    HOME MEDICATIONS: Outpatient Medications Prior to Visit  Medication Sig Dispense Refill  . atorvastatin (LIPITOR) 10 MG tablet TAKE 1 TABLET BY MOUTH DAILY AT SIX IN THE EVENING 90 tablet 1  . Calcium Carb-Cholecalciferol (CALCIUM CARBONATE-VITAMIN D3 PO) Take 1 tablet by mouth daily.     . cetirizine (ZYRTEC) 10 MG tablet Take 1 tablet (10 mg total) by mouth daily. 30 tablet 11  . fluticasone (FLONASE) 50 MCG/ACT nasal spray Place 2 sprays into both nostrils daily. 16 g 6  . gabapentin (NEURONTIN) 300 MG capsule Take 1 tablet three times during the day and 2 at bedtime. (total of 4-5 a day) 150 capsule 5  . traMADol (ULTRAM) 50 MG tablet Take 2 tablets (100 mg total) by mouth every 6 (six) hours as needed. 56 tablet 0  . traMADol HCl 100 MG TABS Take 50-100 mg by mouth every 6 (six) hours as needed. 90 tablet 0  . triamcinolone ointment (KENALOG) 0.5 % Apply 1 application topically 2 (two) times daily. 30 g 0  . zolpidem (AMBIEN) 10 MG tablet TAKE 1/2 TO 1 TABLET BY MOUTH AT BEDTIMEAS NEEDED FOR SLEEP 30 tablet 2  No facility-administered medications prior to visit.     PAST MEDICAL HISTORY: Past Medical History:  Diagnosis Date  . Allergy   . Atypical facial pain   . Hyperlipidemia 08/24/2017  . Neuralgia    Trigeminal  . Sleep apnea    using cpap,OSA  . Trigeminal neuralgia     PAST SURGICAL HISTORY: Past Surgical History:  Procedure Laterality Date  . FOOT SURGERY Left 05/07/2017  . ROOT CANAL  06/2013  . toes right foot  2010  . TONSILLECTOMY AND ADENOIDECTOMY  1950  . torn miniscus  2003  . VAGINAL DELIVERY     X2    FAMILY HISTORY: Family History  Problem Relation Age of Onset  . Stroke Mother   . Brain cancer Mother   . Lung cancer Mother    . Heart attack Mother   . Emphysema Father   . Alzheimer's disease Sister   . Dementia Other        evident after a fall with a hip fracture  . Colon cancer Neg Hx     SOCIAL HISTORY: Social History   Socioeconomic History  . Marital status: Married    Spouse name: [name]  . Number of children: 2  . Years of education: Grad sch.  . 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 Financial resource strain: Not on file  . Food insecurity    Worry: Not on file    Inability: Not on file  . Transportation needs    Medical: Not on file    Non-medical: Not on file  Tobacco Use  . Smoking status: Never Smoker  . Smokeless tobacco: Never Used  Substance and Sexual Activity  . Alcohol use: Yes    Alcohol/week: 0.0 standard drinks    Comment: 1 glass every 2 months; socially  . Drug use: No  . Sexual activity: Not on file  Lifestyle  . Physical activity    Days per week: Not on file    Minutes per session: Not on file  . Stress: Not on file  Relationships  . Social Herbalist on phone: Not on file    Gets together: Not on file    Attends religious service: Not on file    Active member of club or organization: Not on file    Attends meetings of clubs or organizations: Not on file    Relationship status: Not on file  . Intimate partner violence    Fear of current or ex partner: Not on file    Emotionally abused: Not on file    Physically abused: Not on file    Forced sexual activity: Not on file  Other Topics Concern  . Not on file  Social History Narrative   Patient is married [name]) and lives at home with her husband.   Patient has two adult children.   Patient is retired.   Patient has a Production designer, theatre/television/film.   Patient is right-handed.   Patient drinks 1 1/2 cups of coffee daily.      PHYSICAL EXAM  Vitals:   07/03/19  1107  BP: 126/80  Pulse: 62  Temp: 98.2 F (36.8 C)  Weight: 176 lb 6.4 oz (80 kg)  Height: 5\' 5"  (1.651 m)   Body mass index is 29.35 kg/m.  Generalized: Well developed, in no acute distress  Neurological examination  Mentation: Alert oriented to time, place, history taking. Follows all commands speech and language fluent Cranial nerve II-XII:  Extraocular movements were full, visual field were full on confrontational test. . Head turning and shoulder shrug  were normal and symmetric. Motor: The motor testing reveals 5 over 5 strength of all 4 extremities. Good symmetric motor tone is noted throughout. Very  Mild tremor noted in the upper extremities. Sensory: Sensory testing is intact to soft touch on all 4 extremities. No evidence of extinction is noted.  Coordination: Cerebellar testing reveals good finger-nose-finger and heel-to-shin bilaterally.  Gait and station: Gait is normal.  Tandem gait is slightly unsteady.  Romberg is negative Reflexes: Deep tendon reflexes are symmetric and normal bilaterally.   DIAGNOSTIC DATA (LABS, IMAGING, TESTING) - I reviewed patient records, labs, notes, testing and imaging myself where available.  Lab Results  Component Value Date   WBC 4.4 04/11/2019   HGB 12.6 04/11/2019   HCT 38.1 04/11/2019   MCV 96 04/11/2019   PLT 257 04/11/2019      Component Value Date/Time   NA 139 04/11/2019 0925   K 5.1 04/11/2019 0925   CL 102 04/11/2019 0925   CO2 23 04/11/2019 0925   GLUCOSE 97 04/11/2019 0925   GLUCOSE 86 02/02/2019 0951   BUN 14 04/11/2019 0925   CREATININE 0.76 04/11/2019 0925   CREATININE 0.79 02/05/2013 1117   CALCIUM 9.3 04/11/2019 0925   PROT 6.5 04/11/2019 0925   ALBUMIN 4.3 04/11/2019 0925   AST 16 04/11/2019 0925   ALT 8 04/11/2019 0925   ALKPHOS 84 04/11/2019 0925   BILITOT 0.2 04/11/2019 0925   GFRNONAA 79 04/11/2019 0925   GFRAA 91 04/11/2019 0925   Lab Results  Component Value Date   CHOL 204 (H) 02/02/2019    HDL 78.50 02/02/2019   LDLCALC 108 (H) 02/02/2019   LDLDIRECT 106.9 05/22/2013   TRIG 87.0 02/02/2019   CHOLHDL 3 02/02/2019   Lab Results  Component Value Date   HGBA1C 5.5 02/27/2017   No results found for: VITAMINB12 Lab Results  Component Value Date   TSH 2.99 08/04/2018      ASSESSMENT AND PLAN 73 y.o. year old female  has a past medical history of Allergy, Atypical facial pain, Hyperlipidemia (08/24/2017), Neuralgia, Sleep apnea, and Trigeminal neuralgia. here with:  1.  Trigeminal neuralgia 2.  Insomnia  The patient will continue on gabapentin for now.  Hopefully with surgery her pain will be resolved.  At that time we may be able to wean off of gabapentin.  Patient voiced understanding.  She will follow-up in 6 months or sooner if needed.  Ambien will be refilled today.   Ward Givens, MSN, NP-C 07/03/2019, 11:16 AM [facility] [address] [place], [place] [address] [phone]

## 2019-07-03 NOTE — Patient Instructions (Signed)
Your Plan:  Continue gabapentin  If your symptoms worsen or you develop new symptoms please let us know.    Thank you for coming to see us at [facility]. I hope we have been able to provide you high quality care today.  You may receive a patient satisfaction survey over the next few weeks. We would appreciate your feedback and comments so that we may continue to improve ourselves and the health of our patients.  

## 2019-07-14 ENCOUNTER — Encounter: Payer: Self-pay | Admitting: Family Medicine

## 2019-07-24 DIAGNOSIS — G500 Trigeminal neuralgia: Secondary | ICD-10-CM | POA: Diagnosis not present

## 2019-07-24 DIAGNOSIS — Z20828 Contact with and (suspected) exposure to other viral communicable diseases: Secondary | ICD-10-CM | POA: Diagnosis not present

## 2019-07-24 DIAGNOSIS — Z01812 Encounter for preprocedural laboratory examination: Secondary | ICD-10-CM | POA: Diagnosis not present

## 2019-07-26 DIAGNOSIS — Z01818 Encounter for other preprocedural examination: Secondary | ICD-10-CM | POA: Diagnosis not present

## 2019-07-26 DIAGNOSIS — G4733 Obstructive sleep apnea (adult) (pediatric): Secondary | ICD-10-CM | POA: Diagnosis not present

## 2019-07-26 DIAGNOSIS — I10 Essential (primary) hypertension: Secondary | ICD-10-CM | POA: Diagnosis not present

## 2019-07-26 DIAGNOSIS — G500 Trigeminal neuralgia: Secondary | ICD-10-CM | POA: Diagnosis not present

## 2019-07-26 DIAGNOSIS — E785 Hyperlipidemia, unspecified: Secondary | ICD-10-CM | POA: Diagnosis not present

## 2019-07-26 DIAGNOSIS — Z452 Encounter for adjustment and management of vascular access device: Secondary | ICD-10-CM | POA: Diagnosis not present

## 2019-08-07 ENCOUNTER — Encounter: Payer: Self-pay | Admitting: Neurology

## 2019-08-08 ENCOUNTER — Encounter: Payer: PPO | Admitting: Family Medicine

## 2019-08-08 ENCOUNTER — Other Ambulatory Visit: Payer: Self-pay | Admitting: Neurology

## 2019-08-08 MED ORDER — TRAMADOL HCL 50 MG PO TABS: 50.0000 mg | ORAL_TABLET | Freq: Two times a day (BID) | ORAL | 0 refills | Status: DC | PRN

## 2019-08-09 DIAGNOSIS — G500 Trigeminal neuralgia: Secondary | ICD-10-CM | POA: Diagnosis not present

## 2019-08-09 DIAGNOSIS — Z01812 Encounter for preprocedural laboratory examination: Secondary | ICD-10-CM | POA: Diagnosis not present

## 2019-08-09 DIAGNOSIS — Z20828 Contact with and (suspected) exposure to other viral communicable diseases: Secondary | ICD-10-CM | POA: Diagnosis not present

## 2019-08-14 DIAGNOSIS — G500 Trigeminal neuralgia: Secondary | ICD-10-CM | POA: Diagnosis not present

## 2019-08-14 DIAGNOSIS — Z4889 Encounter for other specified surgical aftercare: Secondary | ICD-10-CM | POA: Diagnosis not present

## 2019-08-14 DIAGNOSIS — I10 Essential (primary) hypertension: Secondary | ICD-10-CM | POA: Diagnosis not present

## 2019-08-14 DIAGNOSIS — E785 Hyperlipidemia, unspecified: Secondary | ICD-10-CM | POA: Diagnosis not present

## 2019-08-14 DIAGNOSIS — Z9989 Dependence on other enabling machines and devices: Secondary | ICD-10-CM | POA: Diagnosis not present

## 2019-08-14 DIAGNOSIS — G4733 Obstructive sleep apnea (adult) (pediatric): Secondary | ICD-10-CM | POA: Diagnosis not present

## 2019-08-21 ENCOUNTER — Encounter: Payer: Self-pay | Admitting: Neurology

## 2019-08-24 DIAGNOSIS — Z4802 Encounter for removal of sutures: Secondary | ICD-10-CM | POA: Diagnosis not present

## 2019-08-31 ENCOUNTER — Ambulatory Visit (INDEPENDENT_AMBULATORY_CARE_PROVIDER_SITE_OTHER): Payer: PPO | Admitting: Family Medicine

## 2019-08-31 ENCOUNTER — Other Ambulatory Visit: Payer: Self-pay

## 2019-08-31 ENCOUNTER — Encounter: Payer: Self-pay | Admitting: Family Medicine

## 2019-08-31 VITALS — BP 121/83 | HR 81 | Temp 97.8°F | Resp 16 | Ht 65.0 in | Wt 174.2 lb

## 2019-08-31 DIAGNOSIS — E559 Vitamin D deficiency, unspecified: Secondary | ICD-10-CM

## 2019-08-31 DIAGNOSIS — E785 Hyperlipidemia, unspecified: Secondary | ICD-10-CM

## 2019-08-31 DIAGNOSIS — Z23 Encounter for immunization: Secondary | ICD-10-CM

## 2019-08-31 DIAGNOSIS — E663 Overweight: Secondary | ICD-10-CM

## 2019-08-31 DIAGNOSIS — Z0000 Encounter for general adult medical examination without abnormal findings: Secondary | ICD-10-CM | POA: Diagnosis not present

## 2019-08-31 NOTE — Progress Notes (Signed)
   Subjective:    Patient ID: Laura Stuart, female    DOB: 26-Jun-1946, 73 y.o.   MRN: [id number]  HPI Here today for CPE and MWV.  Risk Factors: Hyperlipidemia- chronic problem, on Lipitor daily Physical Activity: no regular exercise Fall Risk: moderate Depression: no current sxs Hearing: normal to conversational tones, decreased to whispered voice ADL's: independent Cognitive: normal linear thought process, memory and attention intact Home Safety: safe at home Height, Weight, BMI, Visual Acuity: see vitals, vision corrected to 20/20 w/ glasses Counseling: UTD on mammo, colonoscopy, pneumonia vaccines.  Due for flu Labs Ordered: See A&P Care Plan: See A&P   Patient Care Team    Relationship Specialty Notifications Start End  Midge Minium, MD PCP - General Family Medicine  02/04/12    Comment: Jones Skene, Asencion Partridge, MD Consulting Physician Neurology  08/20/15   Irene Shipper, MD Consulting Physician Gastroenterology  08/20/15   Garrel Ridgel, DPM Consulting Physician Podiatry  08/24/17       Review of Systems Patient reports no vision/ hearing changes, adenopathy,fever, weight change,  persistant/recurrent hoarseness , swallowing issues, chest pain, palpitations, edema, persistant/recurrent cough, hemoptysis, dyspnea (rest/exertional/paroxysmal nocturnal), gastrointestinal bleeding (melena, rectal bleeding), abdominal pain, significant heartburn, bowel changes, GU symptoms (dysuria, hematuria, incontinence), Gyn symptoms (abnormal  bleeding, pain),  syncope, focal weakness, memory loss, skin/hair/nail changes, abnormal bruising or bleeding, anxiety, or depression.   + numbness of R face since microvascular decompression of trigeminal nerve    Objective:   Physical Exam General Appearance:    Alert, cooperative, no distress, appears stated age  Head:    Normocephalic, without obvious abnormality, atraumatic  Eyes:    PERRL, conjunctiva/corneas clear, EOM's intact,  fundi    benign, both eyes  Ears:    Normal TM's and external ear canals, both ears  Nose:   Deferred due to COVID  Throat:   Neck:   Supple, symmetrical, trachea midline, no adenopathy;    Thyroid: no enlargement/tenderness/nodules  Back:     Symmetric, no curvature, ROM normal, no CVA tenderness  Lungs:     Clear to auscultation bilaterally, respirations unlabored  Chest Wall:    No tenderness or deformity   Heart:    Regular rate and rhythm, S1 and S2 normal, no murmur, rub   or gallop  Breast Exam:    Deferred to mammo  Abdomen:     Soft, non-tender, bowel sounds active all four quadrants,    no masses, no organomegaly  Genitalia:    Deferred  Rectal:    Extremities:   Extremities normal, atraumatic, no cyanosis or edema  Pulses:   2+ and symmetric all extremities  Skin:   Skin color, texture, turgor normal, no rashes or lesions  Lymph nodes:   Cervical, supraclavicular, and axillary nodes normal  Neurologic:   CNII-XII intact, normal strength, sensation and reflexes    throughout          Assessment & Plan:

## 2019-08-31 NOTE — Assessment & Plan Note (Signed)
Chronic problem.  Tolerating statin w/o difficulty.  Check labs.  Adjust meds prn  

## 2019-08-31 NOTE — Assessment & Plan Note (Signed)
Ongoing issue.  Discussed need for healthy diet and regular exercise.  Will continue to follow. 

## 2019-08-31 NOTE — Patient Instructions (Signed)
Follow up in 6 months to recheck cholesterol We'll notify you of your lab results and make any changes if needed Keep up the good work on healthy diet and regular exercise- you look great! Call with any questions or concerns Stay Safe! You look AMAZING!!!!

## 2019-08-31 NOTE — Assessment & Plan Note (Signed)
Pt has hx of this.  Check labs and replete prn. 

## 2019-08-31 NOTE — Assessment & Plan Note (Signed)
Pt's PE WNL.  UTD on mammo, colonoscopy, immunizations.  Flu shot given.  Check labs.  Anticipatory guidance provided.

## 2019-09-01 ENCOUNTER — Encounter: Payer: Self-pay | Admitting: Family Medicine

## 2019-09-01 LAB — CBC WITH DIFFERENTIAL/PLATELET
Basophils Absolute: 0.1 10*3/uL (ref 0.0–0.1)
Basophils Relative: 1.3 % (ref 0.0–3.0)
Eosinophils Absolute: 0.1 10*3/uL (ref 0.0–0.7)
Eosinophils Relative: 1.5 % (ref 0.0–5.0)
HCT: 39.5 % (ref 36.0–46.0)
Hemoglobin: 13 g/dL (ref 12.0–15.0)
Lymphocytes Relative: 26.7 % (ref 12.0–46.0)
Lymphs Abs: 1.5 10*3/uL (ref 0.7–4.0)
MCHC: 32.9 g/dL (ref 30.0–36.0)
MCV: 88.4 fl (ref 78.0–100.0)
Monocytes Absolute: 0.4 10*3/uL (ref 0.1–1.0)
Monocytes Relative: 8.1 % (ref 3.0–12.0)
Neutro Abs: 3.5 10*3/uL (ref 1.4–7.7)
Neutrophils Relative %: 62.4 % (ref 43.0–77.0)
Platelets: 273 10*3/uL (ref 150.0–400.0)
RBC: 4.47 Mil/uL (ref 3.87–5.11)
RDW: 13.8 % (ref 11.5–15.5)
WBC: 5.6 10*3/uL (ref 4.0–10.5)

## 2019-09-01 LAB — BASIC METABOLIC PANEL
BUN: 18 mg/dL (ref 6–23)
CO2: 28 mEq/L (ref 19–32)
Calcium: 10.2 mg/dL (ref 8.4–10.5)
Chloride: 104 mEq/L (ref 96–112)
Creatinine, Ser: 0.87 mg/dL (ref 0.40–1.20)
GFR: 63.76 mL/min (ref >60.00)
Glucose, Bld: 87 mg/dL (ref 70–99)
Potassium: 4.2 mEq/L (ref 3.5–5.1)
Sodium: 141 mEq/L (ref 135–145)

## 2019-09-01 LAB — HEPATIC FUNCTION PANEL
ALT: 24 U/L (ref 0–35)
AST: 24 U/L (ref 0–37)
Albumin: 4.3 g/dL (ref 3.5–5.2)
Alkaline Phosphatase: 89 U/L (ref 39–117)
Bilirubin, Direct: 0.1 mg/dL (ref 0.0–0.3)
Total Bilirubin: 0.6 mg/dL (ref 0.2–1.2)
Total Protein: 6.5 g/dL (ref 6.0–8.3)

## 2019-09-01 LAB — LIPID PANEL
Cholesterol: 166 mg/dL (ref 0–200)
HDL: 59.8 mg/dL (ref >39.00)
LDL Cholesterol: 94 mg/dL (ref 0–99)
NonHDL: 106.43
Total CHOL/HDL Ratio: 3
Triglycerides: 61 mg/dL (ref 0.0–149.0)
VLDL: 12.2 mg/dL (ref 0.0–40.0)

## 2019-09-01 LAB — VITAMIN D 25 HYDROXY (VIT D DEFICIENCY, FRACTURES): VITD: 81.74 ng/mL (ref 30.00–100.00)

## 2019-09-01 LAB — TSH: TSH: 1.66 u[IU]/mL (ref 0.35–4.50)

## 2019-09-13 DIAGNOSIS — G500 Trigeminal neuralgia: Secondary | ICD-10-CM | POA: Diagnosis not present

## 2019-10-20 DIAGNOSIS — Z1231 Encounter for screening mammogram for malignant neoplasm of breast: Secondary | ICD-10-CM | POA: Diagnosis not present

## 2019-10-20 DIAGNOSIS — E559 Vitamin D deficiency, unspecified: Secondary | ICD-10-CM | POA: Diagnosis not present

## 2019-10-20 LAB — HM MAMMOGRAPHY

## 2019-10-20 LAB — HM DEXA SCAN: HM Dexa Scan: NORMAL

## 2019-10-24 ENCOUNTER — Encounter: Payer: Self-pay | Admitting: General Practice

## 2019-10-30 ENCOUNTER — Other Ambulatory Visit: Payer: Self-pay | Admitting: Adult Health

## 2019-11-01 ENCOUNTER — Encounter: Payer: Self-pay | Admitting: General Practice

## 2019-11-01 ENCOUNTER — Encounter: Payer: Self-pay | Admitting: Internal Medicine

## 2020-01-01 ENCOUNTER — Other Ambulatory Visit: Payer: Self-pay | Admitting: Adult Health

## 2020-01-02 NOTE — Telephone Encounter (Signed)
Drug registry check last fill 12-02-19 #30.

## 2020-01-08 ENCOUNTER — Other Ambulatory Visit: Payer: Self-pay

## 2020-01-08 ENCOUNTER — Encounter: Payer: Self-pay | Admitting: Adult Health

## 2020-01-08 ENCOUNTER — Ambulatory Visit: Payer: PPO | Admitting: Adult Health

## 2020-01-08 VITALS — BP 109/68 | HR 77 | Temp 97.3°F | Ht 65.0 in | Wt 172.8 lb

## 2020-01-08 DIAGNOSIS — G500 Trigeminal neuralgia: Secondary | ICD-10-CM

## 2020-01-08 DIAGNOSIS — G4700 Insomnia, unspecified: Secondary | ICD-10-CM

## 2020-01-08 DIAGNOSIS — G4730 Sleep apnea, unspecified: Secondary | ICD-10-CM

## 2020-01-08 MED ORDER — ZOLPIDEM TARTRATE 10 MG PO TABS: 10.0000 mg | ORAL_TABLET | Freq: Every evening | ORAL | 3 refills | Status: DC | PRN

## 2020-01-08 NOTE — Progress Notes (Signed)
PATIENT: Laura Stuart DOB: 1946-10-27  REASON FOR VISIT: follow up HISTORY FROM: patient  HISTORY OF PRESENT ILLNESS: Today 01/08/20:  Laura Stuart is a 74 year old female with a history of trigeminal neuralgia and obstructive sleep apnea.  She returns today for follow-up.  The patient had craniotomy for trigeminal neuralgia.  She states that since surgery she is not had any discomfort.  She does state that she has some numbness in the right side of the face and lack of taste in the right side of the mouth.  Other than that she has been doing well.  She is no longer on gabapentin.  She continues to use her CPAP nightly.  She has an older machine that we are unable to get a download for.  She does plan to go by her DME company to see if they can get a download.  She continues to use Ambien nightly.  She returns today for an evaluation.  HISTORY Laura Stuart is a 74 year old female with a history of trigeminal neuralgia.  She returns today for follow-up.  She is currently taking gabapentin 300 mg 1 tablet 5 times a day.  She reports that this is controlling her pain but she still has discomfort throughout the day.  She did have a consultation at [facility] for gamma knife procedure as well as surgery.  She states that she has decided to pursue the surgical option and is waiting to be scheduled.  In the past she has tried Tegretol but it interfered with her vision.  She states that since she started gabapentin she has noticed a fine tremor in the hands.  She also noticed that she has a hard time holding onto objects.  She is also on Ambien to help with insomnia.  REVIEW OF SYSTEMS: Out of a complete 14 system review of symptoms, the patient complains only of the following symptoms, and all other reviewed systems are negative.  See HPI  ALLERGIES: Allergies  Allergen Reactions  . Penicillins Other (See Comments)    From allergy testing, unsure if allergy exists    HOME  MEDICATIONS: Outpatient Medications Prior to Visit  Medication Sig Dispense Refill  . atorvastatin (LIPITOR) 10 MG tablet TAKE 1 TABLET BY MOUTH DAILY AT SIX IN THE EVENING (Patient taking differently: Pt taking "1 tablet 3x weekly") 90 tablet 1  . Calcium Carb-Cholecalciferol (CALCIUM CARBONATE-VITAMIN D3 PO) Take 1 tablet by mouth daily. 1200 mg daily    . cetirizine (ZYRTEC) 10 MG tablet Take 1 tablet (10 mg total) by mouth daily. 30 tablet 11  . triamcinolone ointment (KENALOG) 0.5 % Apply 1 application topically 2 (two) times daily. 30 g 0  . zolpidem (AMBIEN) 10 MG tablet TAKE 1/2 TO 1 TABLET BY MOUTH AT BEDTIMEAS NEEDED FOR SLEEP 30 tablet 3  . fluticasone (FLONASE) 50 MCG/ACT nasal spray Place 2 sprays into both nostrils daily. 16 g 6  . gabapentin (NEURONTIN) 300 MG capsule Take 1 tablet three times during the day and 2 at bedtime. (total of 4-5 a day) 150 capsule 5   No facility-administered medications prior to visit.    PAST MEDICAL HISTORY: Past Medical History:  Diagnosis Date  . Allergy   . Atypical facial pain   . Hyperlipidemia 08/24/2017  . Neuralgia    Trigeminal  . Sleep apnea    using cpap,OSA  . Trigeminal neuralgia     PAST SURGICAL HISTORY: Past Surgical History:  Procedure Laterality Date  . FOOT SURGERY Left  05/07/2017  . ROOT CANAL  06/2013  . toes right foot  2010  . TONSILLECTOMY AND ADENOIDECTOMY  1950  . torn miniscus  2003  . VAGINAL DELIVERY     X2    FAMILY HISTORY: Family History  Problem Relation Age of Onset  . Stroke Mother   . Brain cancer Mother   . Lung cancer Mother   . Heart attack Mother   . Emphysema Father   . Alzheimer's disease Sister   . Dementia Other        evident after a fall with a hip fracture  . Colon cancer Neg Hx     SOCIAL HISTORY: Social History   Socioeconomic History  . Marital status: Married    Spouse name: [name]  . Number of children: 2  . Years of education: Grad sch.  . 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 Smoking status: Never Smoker  . Smokeless tobacco: Never Used  Substance and Sexual Activity  . Alcohol use: Yes    Alcohol/week: 0.0 standard drinks    Comment: 1 glass every 2 months; socially  . Drug use: No  . Sexual activity: Not on file  Other Topics Concern  . Not on file  Social History Narrative   Patient is married [name]) and lives at home with her husband.   Patient has two adult children.   Patient is retired.   Patient has a Production designer, theatre/television/film.   Patient is right-handed.   Patient drinks 1 1/2 cups of coffee daily.   Social Determinants of Health   Financial Resource Strain:   . Difficulty of Paying Living Expenses: Not on file  Food Insecurity:   . Worried About Charity fundraiser in the Last Year: Not on file  . Ran Out of Food in the Last Year: Not on file  Transportation Needs:   . Lack of Transportation (Medical): Not on file  . Lack of Transportation (Non-Medical): Not on file  Physical Activity:   . Days of Exercise per Week: Not on file  . Minutes of Exercise per Session: Not on file  Stress:   . Feeling of Stress : Not on file  Social Connections:   . Frequency of Communication with Friends and Family: Not on file  . Frequency of Social Gatherings with Friends and Family: Not on file  . Attends Religious Services: Not on file  . Active Member of Clubs or Organizations: Not on file  . Attends Archivist Meetings: Not on file  . Marital Status: Not on file  Intimate Partner Violence:   . Fear of Current or Ex-Partner: Not on file  . Emotionally Abused: Not on file  . Physically Abused: Not on file  . Sexually Abused: Not on file      PHYSICAL EXAM  Vitals:   01/08/20 1128  BP: 109/68  Pulse: 77  Temp: (!) 97.3 F (36.3 C)  Weight: 172 lb 12.8 oz (78.4 kg)  Height:  5\' 5"  (1.651 m)   Body mass index is 28.76 kg/m.  Generalized: Well developed, in no acute distress   Neurological examination  Mentation: Alert oriented to time, place, history taking. Follows all commands speech and language fluent Cranial nerve II-XII: Pupils were equal round reactive to light. Extraocular movements were full, visual field were full on confrontational test..  Head turning and shoulder shrug  were normal and symmetric. Motor: The motor testing reveals 5 over 5 strength of all 4 extremities. Good symmetric motor tone is noted throughout.  Sensory: Sensory testing is intact to soft touch on all 4 extremities. No evidence of extinction is noted.  Coordination: Cerebellar testing reveals good finger-nose-finger and heel-to-shin bilaterally.  Gait and station: Gait is normal.  Reflexes: Deep tendon reflexes are symmetric and normal bilaterally.   DIAGNOSTIC DATA (LABS, IMAGING, TESTING) - I reviewed patient records, labs, notes, testing and imaging myself where available.  Lab Results  Component Value Date   WBC 5.6 08/31/2019   HGB 13.0 08/31/2019   HCT 39.5 08/31/2019   MCV 88.4 08/31/2019   PLT 273.0 08/31/2019      Component Value Date/Time   NA 141 08/31/2019 1556   NA 139 04/11/2019 0925   K 4.2 08/31/2019 1556   CL 104 08/31/2019 1556   CO2 28 08/31/2019 1556   GLUCOSE 87 08/31/2019 1556   BUN 18 08/31/2019 1556   BUN 14 04/11/2019 0925   CREATININE 0.87 08/31/2019 1556   CREATININE 0.79 02/05/2013 1117   CALCIUM 10.2 08/31/2019 1556   PROT 6.5 08/31/2019 1556   PROT 6.5 04/11/2019 0925   ALBUMIN 4.3 08/31/2019 1556   ALBUMIN 4.3 04/11/2019 0925   AST 24 08/31/2019 1556   ALT 24 08/31/2019 1556   ALKPHOS 89 08/31/2019 1556   BILITOT 0.6 08/31/2019 1556   BILITOT 0.2 04/11/2019 0925   GFRNONAA 79 04/11/2019 0925   GFRAA 91 04/11/2019 0925   Lab Results  Component Value Date   CHOL 166 08/31/2019   HDL 59.80 08/31/2019   LDLCALC 94 08/31/2019    LDLDIRECT 106.9 05/22/2013   TRIG 61.0 08/31/2019   CHOLHDL 3 08/31/2019   Lab Results  Component Value Date   HGBA1C 5.5 02/27/2017   No results found for: VITAMINB12 Lab Results  Component Value Date   TSH 1.66 08/31/2019      ASSESSMENT AND PLAN 74 y.o. year old female  has a past medical history of Allergy, Atypical facial pain, Hyperlipidemia (08/24/2017), Neuralgia, Sleep apnea, and Trigeminal neuralgia. here with:  1.  Trigeminal neuralgia  -No pain since surgery  2.  Obstructive sleep apnea on CPAP  -Unable to obtain a download due to order machine -We will go by DME company to get download  3.  Insomnia -Continue Ambien  Overall the patient is doing well.  She will follow-up in 1 year or sooner if needed   I spent 25 minutes with the patient. 50% of this time was spent reviewing chart and discussing plan of care   Ward Givens, MSN, NP-C 01/08/2020, 11:39 AM [facility] [address] [place], [place] [address] [phone]

## 2020-01-08 NOTE — Patient Instructions (Signed)
Your Plan:  Continue CPAP- try to get download Continue Ambien If your symptoms worsen or you develop new symptoms please let us know.   Thank you for coming to see [place] at [facility]. I hope we have been able to provide you high quality care today.  You may receive a patient satisfaction survey over the next few weeks. We would appreciate your feedback and comments so that we may continue to improve ourselves and the health of our patients.

## 2020-01-22 DIAGNOSIS — H524 Presbyopia: Secondary | ICD-10-CM | POA: Diagnosis not present

## 2020-01-22 DIAGNOSIS — H2513 Age-related nuclear cataract, bilateral: Secondary | ICD-10-CM | POA: Diagnosis not present

## 2020-01-23 DIAGNOSIS — G4733 Obstructive sleep apnea (adult) (pediatric): Secondary | ICD-10-CM | POA: Diagnosis not present

## 2020-02-23 ENCOUNTER — Other Ambulatory Visit: Payer: Self-pay

## 2020-02-23 ENCOUNTER — Ambulatory Visit (INDEPENDENT_AMBULATORY_CARE_PROVIDER_SITE_OTHER): Payer: PPO | Admitting: Family Medicine

## 2020-02-23 ENCOUNTER — Encounter: Payer: Self-pay | Admitting: Family Medicine

## 2020-02-23 VITALS — BP 122/78 | HR 74 | Temp 98.1°F | Resp 16 | Ht 65.0 in | Wt 171.1 lb

## 2020-02-23 DIAGNOSIS — Z1159 Encounter for screening for other viral diseases: Secondary | ICD-10-CM | POA: Diagnosis not present

## 2020-02-23 DIAGNOSIS — Z9189 Other specified personal risk factors, not elsewhere classified: Secondary | ICD-10-CM | POA: Diagnosis not present

## 2020-02-23 DIAGNOSIS — E785 Hyperlipidemia, unspecified: Secondary | ICD-10-CM

## 2020-02-23 DIAGNOSIS — R454 Irritability and anger: Secondary | ICD-10-CM

## 2020-02-23 DIAGNOSIS — E663 Overweight: Secondary | ICD-10-CM

## 2020-02-23 LAB — LIPID PANEL
Cholesterol: 176 mg/dL (ref 0–200)
HDL: 64.3 mg/dL
LDL Cholesterol: 95 mg/dL (ref 0–99)
NonHDL: 111.46
Total CHOL/HDL Ratio: 3
Triglycerides: 83 mg/dL (ref 0.0–149.0)
VLDL: 16.6 mg/dL (ref 0.0–40.0)

## 2020-02-23 LAB — TSH: TSH: 2.36 u[IU]/mL (ref 0.35–4.50)

## 2020-02-23 LAB — CBC WITH DIFFERENTIAL/PLATELET
Basophils Absolute: 0.1 10*3/uL (ref 0.0–0.1)
Basophils Relative: 1.3 % (ref 0.0–3.0)
Eosinophils Absolute: 0.1 10*3/uL (ref 0.0–0.7)
Eosinophils Relative: 2.6 % (ref 0.0–5.0)
HCT: 40.1 % (ref 36.0–46.0)
Hemoglobin: 13.5 g/dL (ref 12.0–15.0)
Lymphocytes Relative: 24.8 % (ref 12.0–46.0)
Lymphs Abs: 1 10*3/uL (ref 0.7–4.0)
MCHC: 33.6 g/dL (ref 30.0–36.0)
MCV: 90.1 fl (ref 78.0–100.0)
Monocytes Absolute: 0.4 10*3/uL (ref 0.1–1.0)
Monocytes Relative: 8.5 % (ref 3.0–12.0)
Neutro Abs: 2.6 10*3/uL (ref 1.4–7.7)
Neutrophils Relative %: 62.8 % (ref 43.0–77.0)
Platelets: 205 10*3/uL (ref 150.0–400.0)
RBC: 4.45 Mil/uL (ref 3.87–5.11)
RDW: 13.9 % (ref 11.5–15.5)
WBC: 4.1 10*3/uL (ref 4.0–10.5)

## 2020-02-23 LAB — HEPATIC FUNCTION PANEL
ALT: 23 U/L (ref 0–35)
AST: 28 U/L (ref 0–37)
Albumin: 4.2 g/dL (ref 3.5–5.2)
Alkaline Phosphatase: 72 U/L (ref 39–117)
Bilirubin, Direct: 0.1 mg/dL (ref 0.0–0.3)
Total Bilirubin: 0.7 mg/dL (ref 0.2–1.2)
Total Protein: 6.4 g/dL (ref 6.0–8.3)

## 2020-02-23 LAB — BASIC METABOLIC PANEL WITH GFR
BUN: 19 mg/dL (ref 6–23)
CO2: 30 meq/L (ref 19–32)
Calcium: 9.5 mg/dL (ref 8.4–10.5)
Chloride: 106 meq/L (ref 96–112)
Creatinine, Ser: 0.81 mg/dL (ref 0.40–1.20)
GFR: 69.15 mL/min
Glucose, Bld: 93 mg/dL (ref 70–99)
Potassium: 4.3 meq/L (ref 3.5–5.1)
Sodium: 141 meq/L (ref 135–145)

## 2020-02-23 NOTE — Assessment & Plan Note (Signed)
Chronic problem.  Tolerating statin w/o difficulty.  Check labs.  Adjust meds prn  

## 2020-02-23 NOTE — Patient Instructions (Addendum)
Schedule your complete physical in 6 months We'll notify you of your lab results and make any changes if needed Continue to work on healthy diet and regular exercise- you're doing great! If you continue to be more irritable, let me know and we can work through that Call and schedule your colonoscopy at your convenience Call with any questions or concerns Stay Safe!  Stay Healthy!

## 2020-02-23 NOTE — Assessment & Plan Note (Signed)
BMI is stable.  Applauded her efforts at healthy diet and regular exercise.  Check labs to risk stratify.

## 2020-02-23 NOTE — Progress Notes (Signed)
   Subjective:    Patient ID: Laura Stuart, female    DOB: 1946-08-03, 74 y.o.   MRN: [id number]  HPI Hyperlipidemia- chronic problem, on Lipitor 10mg  daily.  No CP, SOB, abd pain, N/V, edema.Marland Kitchen  Overweight- BMI is stable at 28.48.  Pt reports she remains active and exercises 30-50 minutes 3-4x/week.  Increased irritability- pt finds that small things have been more bothersome and she is more irritable.  Finds herself snapping.  Snapped at front desk this AM when asked the [place] questions.  Has had both COVID vaccines!   Review of Systems For ROS see HPI   This visit occurred during the SARS-CoV-2 public health emergency.  Safety protocols were in place, including screening questions prior to the visit, additional usage of staff PPE, and extensive cleaning of exam room while observing appropriate contact time as indicated for disinfecting solutions.       Objective:   Physical Exam Vitals reviewed.  Constitutional:      General: She is not in acute distress.    Appearance: Normal appearance. She is well-developed.  HENT:     Head: Normocephalic and atraumatic.  Eyes:     Conjunctiva/sclera: Conjunctivae normal.     Pupils: Pupils are equal, round, and reactive to light.  Neck:     Thyroid: No thyromegaly.  Cardiovascular:     Rate and Rhythm: Normal rate and regular rhythm.     Heart sounds: Normal heart sounds. No murmur.  Pulmonary:     Effort: Pulmonary effort is normal. No respiratory distress.     Breath sounds: Normal breath sounds.  Abdominal:     General: There is no distension.     Palpations: Abdomen is soft.     Tenderness: There is no abdominal tenderness.  Musculoskeletal:     Cervical back: Normal range of motion and neck supple.  Lymphadenopathy:     Cervical: No cervical adenopathy.  Skin:    General: Skin is warm and dry.  Neurological:     Mental Status: She is alert and oriented to person, place, and time.  Psychiatric:        Behavior:  Behavior normal.           Assessment & Plan:  Irritable mood- pt is not interested in medication at this time but we had long discussion that increased irritability is often a sx of anxiety or depression.  I encouraged her to monitor her symptoms and ask her family how she is responding to them.  Will follow.

## 2020-02-26 ENCOUNTER — Ambulatory Visit: Payer: PPO | Admitting: Family Medicine

## 2020-02-26 LAB — HEPATITIS C ANTIBODY
Hepatitis C Ab: NONREACTIVE
SIGNAL TO CUT-OFF: 0.01 (ref <1.00)

## 2020-02-27 ENCOUNTER — Other Ambulatory Visit: Payer: Self-pay | Admitting: Family Medicine

## 2020-02-27 ENCOUNTER — Encounter: Payer: Self-pay | Admitting: Emergency Medicine

## 2020-03-11 ENCOUNTER — Encounter: Payer: Self-pay | Admitting: Internal Medicine

## 2020-03-19 DIAGNOSIS — H5010 Unspecified exotropia: Secondary | ICD-10-CM | POA: Diagnosis not present

## 2020-03-20 ENCOUNTER — Other Ambulatory Visit: Payer: Self-pay | Admitting: Family Medicine

## 2020-04-02 DIAGNOSIS — H5010 Unspecified exotropia: Secondary | ICD-10-CM | POA: Diagnosis not present

## 2020-04-10 DIAGNOSIS — H5021 Vertical strabismus, right eye: Secondary | ICD-10-CM | POA: Diagnosis not present

## 2020-04-10 DIAGNOSIS — H5034 Intermittent alternating exotropia: Secondary | ICD-10-CM | POA: Diagnosis not present

## 2020-04-17 ENCOUNTER — Other Ambulatory Visit: Payer: Self-pay

## 2020-04-17 ENCOUNTER — Ambulatory Visit (AMBULATORY_SURGERY_CENTER): Payer: Self-pay | Admitting: *Deleted

## 2020-04-17 VITALS — Temp 96.9°F | Ht 65.0 in | Wt 172.0 lb

## 2020-04-17 DIAGNOSIS — Z86010 Personal history of colonic polyps: Secondary | ICD-10-CM

## 2020-04-17 MED ORDER — SUTAB 1479-225-188 MG PO TABS: 24.0000 | ORAL_TABLET | ORAL | 0 refills | Status: DC

## 2020-04-17 NOTE — Progress Notes (Signed)
No egg or soy allergy known to patient  No issues with past sedation with any surgeries  or procedures, no intubation problems  No diet pills per patient No home 02 use per patient  No blood thinners per patient  Pt denies issues with constipation  No A fib or A flutter  EMMI video sent to pt's e mail   01-25-2020 COMPLETED COVID VACCINES   Due to the COVID-19 pandemic we are asking patients to follow these guidelines. Please only bring one care partner. Please be aware that your care partner may wait in the car in the parking lot or if they feel like they will be too hot to wait in the car, they may wait in the lobby on the 4th floor. All care partners are required to wear a mask the entire time (we do not have any that we can provide them), they need to practice social distancing, and we will do a Covid check for all patient's and care partners when you arrive. Also we will check their temperature and your temperature. If the care partner waits in their car they need to stay in the parking lot the entire time and we will call them on their cell phone when the patient is ready for discharge so they can bring the car to the front of the building. Also all patient's will need to wear a mask into building.

## 2020-04-18 ENCOUNTER — Telehealth: Payer: Self-pay | Admitting: Internal Medicine

## 2020-04-18 DIAGNOSIS — Z860100 Personal history of colon polyps, unspecified: Secondary | ICD-10-CM

## 2020-04-18 MED ORDER — SUTAB 1479-225-188 MG PO TABS: 24.0000 | ORAL_TABLET | ORAL | 0 refills | Status: DC

## 2020-04-18 NOTE — Telephone Encounter (Signed)
Resent sutab to pharmacy - marie pv

## 2020-04-18 NOTE — Telephone Encounter (Signed)
Pt called stating that her pharmacy has not received prescription for sutab. Pls send it again.

## 2020-04-19 ENCOUNTER — Encounter: Payer: Self-pay | Admitting: Internal Medicine

## 2020-05-01 ENCOUNTER — Encounter: Payer: PPO | Admitting: Internal Medicine

## 2020-06-28 ENCOUNTER — Ambulatory Visit (AMBULATORY_SURGERY_CENTER): Payer: PPO | Admitting: Internal Medicine

## 2020-06-28 ENCOUNTER — Other Ambulatory Visit: Payer: Self-pay

## 2020-06-28 ENCOUNTER — Encounter: Payer: Self-pay | Admitting: Internal Medicine

## 2020-06-28 VITALS — BP 109/70 | HR 49 | Temp 97.9°F | Resp 14 | Ht 65.0 in | Wt 172.0 lb

## 2020-06-28 DIAGNOSIS — D123 Benign neoplasm of transverse colon: Secondary | ICD-10-CM | POA: Diagnosis not present

## 2020-06-28 DIAGNOSIS — Z86010 Personal history of colonic polyps: Secondary | ICD-10-CM

## 2020-06-28 MED ORDER — SODIUM CHLORIDE 0.9 % IV SOLN: 500.0000 mL | INTRAVENOUS | Status: DC

## 2020-06-28 NOTE — Progress Notes (Signed)
No problems noted in the recovery room. maw 

## 2020-06-28 NOTE — Progress Notes (Signed)
Called to room to assist during endoscopic procedure.  Patient ID and intended procedure confirmed with present staff. Received instructions for my participation in the procedure from the performing physician.  

## 2020-06-28 NOTE — Patient Instructions (Addendum)
YOU HAD AN ENDOSCOPIC PROCEDURE TODAY AT [facility]:   Refer to the procedure report that was given to you for any specific questions about what was found during the examination.  If the procedure report does not answer your questions, please call your gastroenterologist to clarify.  If you requested that your care partner not be given the details of your procedure findings, then the procedure report has been included in a sealed envelope for you to review at your convenience later.  YOU SHOULD EXPECT: Some feelings of bloating in the abdomen. Passage of more gas than usual.  Walking can help get rid of the air that was put into your GI tract during the procedure and reduce the bloating. If you had a lower endoscopy (such as a colonoscopy or flexible sigmoidoscopy) you may notice spotting of blood in your stool or on the toilet paper. If you underwent a bowel prep for your procedure, you may not have a normal bowel movement for a few days.  Please Note:  You might notice some irritation and congestion in your nose or some drainage.  This is from the oxygen used during your procedure.  There is no need for concern and it should clear up in a day or so.  SYMPTOMS TO REPORT IMMEDIATELY:   Following lower endoscopy (colonoscopy or flexible sigmoidoscopy):  Excessive amounts of blood in the stool  Significant tenderness or worsening of abdominal pains  Swelling of the abdomen that is new, acute  Fever of 100F or higher   For urgent or emergent issues, a gastroenterologist can be reached at any hour by calling [phone]. Do not use MyChart messaging for urgent concerns.    DIET:  We do recommend a small meal at first, but then you may proceed to your regular diet.  Drink plenty of fluids but you should avoid alcoholic beverages for 24 hours.  ACTIVITY:  You should plan to take it easy for the rest of today and you should NOT DRIVE or use heavy machinery until tomorrow (because  of the sedation medicines used during the test).    FOLLOW UP: Our staff will call the number listed on your records 48-72 hours following your procedure to check on you and address any questions or concerns that you may have regarding the information given to you following your procedure. If we do not reach you, we will leave a message.  We will attempt to reach you two times.  During this call, we will ask if you have developed any symptoms of COVID 19. If you develop any symptoms (ie: fever, flu-like symptoms, shortness of breath, cough etc.) before then, please call [phone].  If you test positive for Covid 19 in the 2 weeks post procedure, please call and report this information to [place].    If any biopsies were taken you will be contacted by phone or by letter within the next 1-3 weeks.  Please call us at [phone] if you have not heard about the biopsies in 3 weeks.    SIGNATURES/CONFIDENTIALITY: You and/or your care partner have signed paperwork which will be entered into your electronic medical record.  These signatures attest to the fact that that the information above on your After Visit Summary has been reviewed and is understood.  Full responsibility of the confidentiality of this discharge information lies with you and/or your care-partner.     Handouts were given to you on polyps  Diverticulosis. You may resume your current  medications today. Await biopsy results. Please call if any questions or concerns.

## 2020-06-28 NOTE — Progress Notes (Signed)
pt tolerated well. VSS. awake and to recovery. Report given to RN.  

## 2020-06-28 NOTE — Op Note (Signed)
[facility] Patient Name: Teagan Ozawa Procedure Date: 06/28/2020 7:31 AM MRN: [id number] Endoscopist: Docia Chuck. Henrene Pastor , MD Age: 74 Referring MD:  Date of Birth: 19-Jun-1946 Gender: Female Account #: [id number] Procedure:                Colonoscopy with cold snare polypectomy x 1 Indications:              High risk colon cancer surveillance: Personal                            history of non-advanced adenoma. Negative index                            exam 2007. Most recent exam 2015 with diminutive                            adenoma Medicines:                Monitored Anesthesia Care Procedure:                Pre-Anesthesia Assessment:                           - Prior to the procedure, a History and Physical                            was performed, and patient medications and                            allergies were reviewed. The patient's tolerance of                            previous anesthesia was also reviewed. The risks                            and benefits of the procedure and the sedation                            options and risks were discussed with the patient.                            All questions were answered, and informed consent                            was obtained. Prior Anticoagulants: The patient has                            taken no previous anticoagulant or antiplatelet                            agents. ASA Grade Assessment: II - A patient with                            mild systemic disease. After reviewing the risks  and benefits, the patient was deemed in                            satisfactory condition to undergo the procedure.                           After obtaining informed consent, the colonoscope                            was passed under direct vision. Throughout the                            procedure, the patient's blood pressure, pulse, and                            oxygen saturations were  monitored continuously. The                            Colonoscope was introduced through the anus and                            advanced to the the cecum, identified by                            appendiceal orifice and ileocecal valve. The                            ileocecal valve, appendiceal orifice, and rectum                            were photographed. The quality of the bowel                            preparation was excellent. The colonoscopy was                            performed without difficulty. The patient tolerated                            the procedure well. The bowel preparation used was                            SUPREP via split dose instruction. Scope In: 8:22:24 AM Scope Out: 8:35:07 AM Scope Withdrawal Time: 0 hours 8 minutes 4 seconds  Total Procedure Duration: 0 hours 12 minutes 43 seconds  Findings:                 A 4 mm polyp was found in the transverse colon. The                            polyp was removed with a cold snare. Resection and                            retrieval were complete.  Multiple diverticula were found in the sigmoid                            colon.                           The exam was otherwise without abnormality on                            direct and retroflexion views. Complications:            No immediate complications. Estimated blood loss:                            None. Estimated Blood Loss:     Estimated blood loss: none. Impression:               - One 4 mm polyp in the transverse colon, removed                            with a cold snare. Resected and retrieved.                           - Diverticulosis in the sigmoid colon.                           - The examination was otherwise normal on direct                            and retroflexion views. Recommendation:           - Repeat colonoscopy is not recommended for                            surveillance.                           -  Patient has a contact number available for                            emergencies. The signs and symptoms of potential                            delayed complications were discussed with the                            patient. Return to normal activities tomorrow.                            Written discharge instructions were provided to the                            patient.                           - Resume previous diet.                           -  Continue present medications.                           - Await pathology results. Docia Chuck. Henrene Pastor, MD 06/28/2020 8:55:31 AM This report has been signed electronically.

## 2020-07-02 ENCOUNTER — Telehealth: Payer: Self-pay | Admitting: *Deleted

## 2020-07-02 ENCOUNTER — Encounter: Payer: Self-pay | Admitting: Internal Medicine

## 2020-07-02 NOTE — Telephone Encounter (Signed)
  Follow up Call-  Call back number 06/28/2020  Post procedure Call Back phone  # [phone]  Permission to leave phone message Yes  Some recent data might be hidden     Patient questions:  Do you have a fever, pain , or abdominal swelling? No. Pain Score  0 *  Have you tolerated food without any problems? Yes.    Have you been able to return to your normal activities? Yes.    Do you have any questions about your discharge instructions: Diet   No. Medications  No. Follow up visit  No.  Do you have questions or concerns about your Care? No.  Actions: * If pain score is 4 or above: No action needed, pain <4.   1. Have you developed a fever since your procedure? no  2.   Have you had an respiratory symptoms (SOB or cough) since your procedure? no  3.   Have you tested positive for COVID 19 since your procedure no  4.   Have you had any family members/close contacts diagnosed with the COVID 19 since your procedure?  no   If yes to any of these questions please route to Joylene John, RN and Erenest Rasher, RN

## 2020-07-02 NOTE — Telephone Encounter (Signed)
No answer for post procedure callback. Left message and will call back later today. 

## 2020-07-23 ENCOUNTER — Other Ambulatory Visit: Payer: Self-pay | Admitting: Adult Health

## 2020-08-26 ENCOUNTER — Telehealth: Payer: Self-pay | Admitting: Adult Health

## 2020-08-26 NOTE — Telephone Encounter (Signed)
Spoke to pt, made VV appt with NP

## 2020-08-26 NOTE — Telephone Encounter (Signed)
Patient will need to be seen as she will need a repeat sleep study before I can order a new machine

## 2020-08-26 NOTE — Telephone Encounter (Signed)
Pt states 2 years ago she was offered a script for a new CPAP, pt declined at that time but would not like to get a script to replace her 74 year old CPAP, please call.

## 2020-08-27 ENCOUNTER — Encounter: Payer: Self-pay | Admitting: Adult Health

## 2020-08-27 ENCOUNTER — Telehealth: Payer: Self-pay | Admitting: Adult Health

## 2020-08-27 NOTE — Telephone Encounter (Signed)
I tried to make conection twice but was unable to make contact with the patient.  My next patient was waiting so I had to see them.  I'm happy to try another virtual visit tomorrow at 10:00 or she is welcome to come into the office at that time

## 2020-08-28 ENCOUNTER — Encounter: Payer: Self-pay | Admitting: Family Medicine

## 2020-08-29 ENCOUNTER — Ambulatory Visit (INDEPENDENT_AMBULATORY_CARE_PROVIDER_SITE_OTHER): Payer: PPO | Admitting: Adult Health

## 2020-08-29 ENCOUNTER — Other Ambulatory Visit: Payer: Self-pay

## 2020-08-29 ENCOUNTER — Encounter: Payer: Self-pay | Admitting: Adult Health

## 2020-08-29 VITALS — BP 118/70 | HR 75 | Ht 65.0 in | Wt 173.0 lb

## 2020-08-29 DIAGNOSIS — G4733 Obstructive sleep apnea (adult) (pediatric): Secondary | ICD-10-CM | POA: Diagnosis not present

## 2020-08-29 DIAGNOSIS — Z9989 Dependence on other enabling machines and devices: Secondary | ICD-10-CM | POA: Diagnosis not present

## 2020-08-29 NOTE — Telephone Encounter (Signed)
Appt made today with MM/NP at 2:30 Pt needs sleep study for new CPAP machine  She is interested in A home sleep study

## 2020-08-29 NOTE — Telephone Encounter (Signed)
Pt is asking for a call about Dr Brett Fairy approving her for a sleep study so she can go about getting a new CPAP.  Pt is aware of her 1 yr f/u in Feb. Pt is asking if her PCP can give any kind of approval to help this process along, please call.

## 2020-08-29 NOTE — Patient Instructions (Signed)
Your Plan:  Order for home sleep test placed. When she completed test we will get you scheduled for a follow-up appointment If your symptoms worsen or you develop new symptoms please let us know.   Thank you for coming to see [place] at [facility]. I hope we have been able to provide you high quality care today.  You may receive a patient satisfaction survey over the next few weeks. We would appreciate your feedback and comments so that we may continue to improve ourselves and the health of our patients.

## 2020-08-29 NOTE — Progress Notes (Signed)
PATIENT: Laura Stuart DOB: October 02, 1946  REASON FOR VISIT: follow up HISTORY FROM: patient  HISTORY OF PRESENT ILLNESS: Today 08/29/20:  Laura Stuart is a 74 year old female with a history of obstructive sleep apnea.  The patient returns today for a face-to-face visit in order to get a new machine.  She currently has an old machine.  We are unable to obtain any data.  She reports that she has been compliant with her CPAP but she tries to use it nightly.  She does find it beneficial.  She returns today for an evaluation.  HISTORY Laura Stuart is a 75 year old female with a history of trigeminal neuralgia and obstructive sleep apnea.  She returns today for follow-up.  The patient had craniotomy for trigeminal neuralgia.  She states that since surgery she is not had any discomfort.  She does state that she has some numbness in the right side of the face and lack of taste in the right side of the mouth.  Other than that she has been doing well.  She is no longer on gabapentin.  She continues to use her CPAP nightly.  She has an older machine that we are unable to get a download for.  She does plan to go by her DME company to see if they can get a download.  She continues to use Ambien nightly.  She returns today for an evaluation.   REVIEW OF SYSTEMS: Out of a complete 14 system review of symptoms, the patient complains only of the following symptoms, and all other reviewed systems are negative.   ESS 2  ALLERGIES: Allergies  Allergen Reactions  . Penicillins Other (See Comments)    From allergy testing, unsure if allergy exists    HOME MEDICATIONS: Outpatient Medications Prior to Visit  Medication Sig Dispense Refill  . atorvastatin (LIPITOR) 10 MG tablet TAKE 1 TABLET BY MOUTH DAILY AT 6PM (Patient taking differently: THREE TIMES A WEEK) 90 tablet 1  . Calcium Carb-Cholecalciferol (CALCIUM CARBONATE-VITAMIN D3 PO) Take 1 tablet by mouth daily. 1200 mg daily WITH 5000 IU D    .  cetirizine (ZYRTEC) 10 MG tablet TAKE 1 TABLET BY MOUTH DAILY 30 tablet 11  . fluticasone (FLONASE) 50 MCG/ACT nasal spray INSTILL 2 SPRAYS INTO EACH NOSTRIL DAILY 16 g 6  . glucosamine-chondroitin 500-400 MG tablet Take 1 tablet by mouth 3 (three) times daily.    Marland Kitchen zolpidem (AMBIEN) 10 MG tablet TAKE 1 TABLET BY MOUTH EACH NIGHT AT BEDTIME AS NEEDED FOR SLEEP 30 tablet 3  . Sodium Sulfate-Mag Sulfate-KCl (SUTAB) [phone] MG TABS Take 24 tablets by mouth as directed. 24 tablet 0  . Sodium Sulfate-Mag Sulfate-KCl (SUTAB) [phone] MG TABS Take 24 tablets by mouth as directed. 24 tablet 0  . triamcinolone ointment (KENALOG) 0.5 % Apply 1 application topically 2 (two) times daily. (Patient not taking: Reported on 06/28/2020) 30 g 0   No facility-administered medications prior to visit.    PAST MEDICAL HISTORY: Past Medical History:  Diagnosis Date  . Allergy   . Atypical facial pain   . Hyperlipidemia 08/24/2017  . Neuralgia    Trigeminal  . Sleep apnea    using cpap,OSA  . Trigeminal neuralgia    PAST HX- HAD CRANIOTOMY 08-14-2019 FIXED THIS     PAST SURGICAL HISTORY: Past Surgical History:  Procedure Laterality Date  . COLONOSCOPY    . FOOT SURGERY Left 05/07/2017  . POLYPECTOMY    . ROOT CANAL  06/2013  . toes right  foot Bilateral 2010   STRAIGHTENED BOTH FEET   . TONSILLECTOMY AND ADENOIDECTOMY  1950  . torn miniscus  2003   X2  . UPPER GASTROINTESTINAL ENDOSCOPY    . VAGINAL DELIVERY     X2    FAMILY HISTORY: Family History  Problem Relation Age of Onset  . Stroke Mother   . Brain cancer Mother   . Lung cancer Mother   . Heart attack Mother   . Emphysema Father   . Alzheimer's disease Sister   . Dementia Other        evident after a fall with a hip fracture  . Colon cancer Neg Hx   . Colon polyps Neg Hx   . Esophageal cancer Neg Hx   . Rectal cancer Neg Hx   . Stomach cancer Neg Hx     SOCIAL HISTORY: Social History   Socioeconomic History  .  Marital status: Married    Spouse name: [name]  . Number of children: 2  . Years of education: Grad sch.  . 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 Smoking status: Never Smoker  . Smokeless tobacco: Never Used  Substance and Sexual Activity  . Alcohol use: Yes    Alcohol/week: 0.0 standard drinks    Comment: 1 glass every 2 months; socially  . Drug use: No  . Sexual activity: Not on file  Other Topics Concern  . Not on file  Social History Narrative   Patient is married [name]) and lives at home with her husband.   Patient has two adult children.   Patient is retired.   Patient has a Production designer, theatre/television/film.   Patient is right-handed.   Patient drinks 1 1/2 cups of coffee daily.   Social Determinants of Health   Financial Resource Strain:   . Difficulty of Paying Living Expenses: Not on file  Food Insecurity:   . Worried About Charity fundraiser in the Last Year: Not on file  . Ran Out of Food in the Last Year: Not on file  Transportation Needs:   . Lack of Transportation (Medical): Not on file  . Lack of Transportation (Non-Medical): Not on file  Physical Activity:   . Days of Exercise per Week: Not on file  . Minutes of Exercise per Session: Not on file  Stress:   . Feeling of Stress : Not on file  Social Connections:   . Frequency of Communication with Friends and Family: Not on file  . Frequency of Social Gatherings with Friends and Family: Not on file  . Attends Religious Services: Not on file  . Active Member of Clubs or Organizations: Not on file  . Attends Archivist Meetings: Not on file  . Marital Status: Not on file  Intimate Partner Violence:   . Fear of Current or Ex-Partner: Not on file  . Emotionally Abused: Not on file  . Physically Abused: Not on file  . Sexually Abused: Not on file      PHYSICAL  EXAM  Vitals:   08/29/20 1428  BP: 118/70  Pulse: 75  Weight: 173 lb (78.5 kg)  Height: 5\' 5"  (1.651 m)   Body mass index is 28.79 kg/m.  Generalized: Well developed, in no acute distress  Chest: Lungs clear to auscultation bilaterally  Neurological examination  Mentation: Alert oriented to time, place,  history taking. Follows all commands speech and language fluent Cranial nerve II-XII: Extraocular movements were full, visual field were full on confrontational test Head turning and shoulder shrug  were normal and symmetric. Motor: The motor testing reveals 5 over 5 strength of all 4 extremities. Good symmetric motor tone is noted throughout.  Sensory: Sensory testing is intact to soft touch on all 4 extremities. No evidence of extinction is noted.  Gait and station: Gait is normal.    DIAGNOSTIC DATA (LABS, IMAGING, TESTING) - I reviewed patient records, labs, notes, testing and imaging myself where available.  Lab Results  Component Value Date   WBC 4.1 02/23/2020   HGB 13.5 02/23/2020   HCT 40.1 02/23/2020   MCV 90.1 02/23/2020   PLT 205.0 02/23/2020      Component Value Date/Time   NA 141 02/23/2020 1112   NA 139 04/11/2019 0925   K 4.3 02/23/2020 1112   CL 106 02/23/2020 1112   CO2 30 02/23/2020 1112   GLUCOSE 93 02/23/2020 1112   BUN 19 02/23/2020 1112   BUN 14 04/11/2019 0925   CREATININE 0.81 02/23/2020 1112   CREATININE 0.79 02/05/2013 1117   CALCIUM 9.5 02/23/2020 1112   PROT 6.4 02/23/2020 1112   PROT 6.5 04/11/2019 0925   ALBUMIN 4.2 02/23/2020 1112   ALBUMIN 4.3 04/11/2019 0925   AST 28 02/23/2020 1112   ALT 23 02/23/2020 1112   ALKPHOS 72 02/23/2020 1112   BILITOT 0.7 02/23/2020 1112   BILITOT 0.2 04/11/2019 0925   GFRNONAA 79 04/11/2019 0925   GFRAA 91 04/11/2019 0925   Lab Results  Component Value Date   CHOL 176 02/23/2020   HDL 64.30 02/23/2020   LDLCALC 95 02/23/2020   LDLDIRECT 106.9 05/22/2013   TRIG 83.0 02/23/2020   CHOLHDL 3  02/23/2020   Lab Results  Component Value Date   HGBA1C 5.5 02/27/2017   No results found for: VITAMINB12 Lab Results  Component Value Date   TSH 2.36 02/23/2020      ASSESSMENT AND PLAN 74 y.o. year old female  has a past medical history of Allergy, Atypical facial pain, Hyperlipidemia (08/24/2017), Neuralgia, Sleep apnea, and Trigeminal neuralgia. here with:  1. OSA on CPAP  -Patient is encouraged to continue using CPAP nightly and greater than 4 hours each night -Home sleep test ordered-patient would like a new updated machine -Will follow up after home sleep test   I spent 25 minutes of face-to-face and non-face-to-face time with patient.  This included previsit chart review, lab review, study review, order entry, electronic health record documentation, patient education.  Ward Givens, MSN, NP-C 08/29/2020, 2:41 PM [facility] [address] [place], [place] [address] [phone]

## 2020-09-02 ENCOUNTER — Encounter: Payer: PPO | Admitting: Family Medicine

## 2020-09-02 ENCOUNTER — Other Ambulatory Visit: Payer: Self-pay | Admitting: Neurology

## 2020-09-02 DIAGNOSIS — G4730 Sleep apnea, unspecified: Secondary | ICD-10-CM

## 2020-09-02 DIAGNOSIS — G4733 Obstructive sleep apnea (adult) (pediatric): Secondary | ICD-10-CM

## 2020-09-02 DIAGNOSIS — Z9989 Dependence on other enabling machines and devices: Secondary | ICD-10-CM

## 2020-09-05 ENCOUNTER — Telehealth: Payer: Self-pay

## 2020-09-05 NOTE — Progress Notes (Signed)
Subjective:   Laura Stuart is a 74 y.o. female who presents for Medicare Annual (Subsequent) preventive examination.  I connected with Laura Stuart today by telephone and verified that I am speaking with the correct person using two identifiers. Location patient: home Location provider: work Persons participating in the virtual visit: patient, Marine scientist.    I discussed the limitations, risks, security and privacy concerns of performing an evaluation and management service by telephone and the availability of in person appointments. I also discussed with the patient that there may be a patient responsible charge related to this service. The patient expressed understanding and verbally consented to this telephonic visit.    Interactive audio and video telecommunications were attempted between this provider and patient, however failed, due to patient having technical difficulties OR patient did not have access to video capability.  We continued and completed visit with audio only.  Some vital signs may be absent or patient reported.   Time Spent with patient on telephone encounter: 30 minutes  Review of Systems     Cardiac Risk Factors include: advanced age (>43men, >38 women);dyslipidemia     Objective:    Today's Vitals   09/09/20 1501  Weight: 173 lb (78.5 kg)  Height: 5\' 5"  (1.651 m)   Body mass index is 28.79 kg/m.  Advanced Directives 09/09/2020 08/31/2019 08/24/2017 02/27/2017 02/26/2017 07/31/2015  Does Patient Have a Medical Advance Directive? Yes Yes Yes No No Yes  Type of Paramedic of [place];Living will [place];Living will Living will;Healthcare Power of [place];Living will  Does patient want to make changes to medical advance directive? Yes (MAU/Ambulatory/Procedural Areas - Information given) Yes (MAU/Ambulatory/Procedural Areas - Information given) - - - -  Copy of [place] in  Chart? No - copy requested No - copy requested No - copy requested - - -  Would patient like information on creating a medical advance directive? - - - No - Patient declined No - Patient declined -    Current Medications (verified) Outpatient Encounter Medications as of 09/09/2020  Medication Sig   atorvastatin (LIPITOR) 10 MG tablet TAKE 1 TABLET BY MOUTH DAILY AT 6PM (Patient taking differently: THREE TIMES A WEEK)   Calcium Carb-Cholecalciferol (CALCIUM CARBONATE-VITAMIN D3 PO) Take 1 tablet by mouth daily. 1200 mg daily WITH 5000 IU D   cetirizine (ZYRTEC) 10 MG tablet TAKE 1 TABLET BY MOUTH DAILY   fluticasone (FLONASE) 50 MCG/ACT nasal spray INSTILL 2 SPRAYS INTO EACH NOSTRIL DAILY   glucosamine-chondroitin 500-400 MG tablet Take 1 tablet by mouth 3 (three) times daily.   No facility-administered encounter medications on file as of 09/09/2020.    Allergies (verified) Penicillins   History: Past Medical History:  Diagnosis Date   Allergy    Atypical facial pain    Hyperlipidemia 08/24/2017   Neuralgia    Trigeminal   Sleep apnea    using cpap,OSA   Trigeminal neuralgia    PAST HX- HAD CRANIOTOMY 08-14-2019 FIXED THIS    Past Surgical History:  Procedure Laterality Date   COLONOSCOPY     FOOT SURGERY Left 05/07/2017   POLYPECTOMY     ROOT CANAL  06/2013   toes right foot Bilateral 2010   STRAIGHTENED BOTH FEET    TONSILLECTOMY AND ADENOIDECTOMY  1950   torn miniscus  2003   X2   UPPER GASTROINTESTINAL ENDOSCOPY     VAGINAL DELIVERY     X2   Family  History  Problem Relation Age of Onset   Stroke Mother    Brain cancer Mother    Lung cancer Mother    Heart attack Mother    Emphysema Father    Alzheimer's disease Sister    Dementia Other        evident after a fall with a hip fracture   Colon cancer Neg Hx    Colon polyps Neg Hx    Esophageal cancer Neg Hx    Rectal cancer Neg Hx    Stomach cancer Neg Hx    Social History    Socioeconomic History   Marital status: Married    Spouse name: Laura Stuart   Number of children: 2   Years of education: Grad sch.   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   Patient has two adult children.   Patient is retired.   Patient has a Production designer, theatre/television/film.   Patient is right-handed.   Patient drinks 1 1/2 cups of coffee daily.   Social Determinants of Health   Financial Resource Strain: Low Risk    Difficulty of Paying Living Expenses: Not hard at all  Food Insecurity: No Food Insecurity   Worried About Charity fundraiser in the Last Year: Never true   [place] in the Last Year: Never true  Transportation Needs: No Transportation Needs   Lack of Transportation (Medical): No   Lack of Transportation (Non-Medical): No  Physical Activity: Insufficiently Active   Days of Exercise per Week: 4 days   Minutes of Exercise per Session: 30 min  Stress: No Stress Concern Present   Feeling of Stress : Not at all  Social Connections: Socially Integrated   Frequency of Communication with Friends and Family: More than three times a week   Frequency of Social Gatherings with Friends and Family: More than three times a week   Attends Religious Services: More than 4 times per year   Active Member of [employer] or Organizations: Yes   Attends Music therapist: More than 4 times per year   Marital Status: Married    Tobacco Counseling Counseling  given: Not Answered   Clinical Intake:  Pre-visit preparation completed: Yes  Pain : No/denies pain     Nutritional Status: BMI 25 -29 Overweight Nutritional Risks: None Diabetes: No  How often do you need to have someone help you when you read instructions, pamphlets, or other written materials from your doctor or pharmacy?: 1 - Never What is the last grade level you completed in school?: Master's Degree  Diabetic?No  Interpreter Needed?: No  Information entered by :: Caroleen Hamman LPN   Activities of Daily Living In your present state of health, do you have any difficulty performing the following activities: 09/09/2020 02/23/2020  Hearing? N N  Vision? N N  Difficulty concentrating or making decisions? N N  Walking or climbing stairs? N N  Dressing or bathing? N N  Doing errands, shopping? N N  Preparing Food and eating ? N -  Using the Toilet? N -  In the past six months, have you accidently leaked urine? N -  Do you have problems with loss of bowel control? N -  Managing your Medications? N -  Managing your Finances? N -  Housekeeping or managing your Housekeeping? N -  Some recent data might be hidden    Patient Care Team: Midge Minium, MD as PCP - General (Family Medicine) Dohmeier, Asencion Partridge, MD as Consulting Physician (Neurology) Irene Shipper, MD as Consulting Physician (Gastroenterology) Garrel Ridgel, DPM as Consulting Physician (Podiatry)  Indicate any recent Medical Services you may have received from other than [facility] providers in the past year (date may be approximate).     Assessment:   This is a routine wellness examination for Mary-Elisabeth.  Hearing/Vision screen  Hearing Screening   125Hz  250Hz  500Hz  1000Hz  2000Hz  3000Hz  4000Hz  6000Hz  8000Hz   Right ear:           Left ear:           Comments: No issues  Vision Screening Comments: Wears glasses Last eye exam-03/2020-Dr.Bowen and Dr. Annamaria Boots  Dietary issues and exercise activities  discussed: Current Exercise Habits: Home exercise routine, Type of exercise: strength training/weights;Other - see comments (stair climbing), Time (Minutes): 30, Frequency (Times/Week): 4, Weekly Exercise (Minutes/Week): 120, Intensity: Mild, Exercise limited by: None identified  Goals     patient     Maintain current health by staying active. Drink more water      Depression Screen PHQ 2/9 Scores 09/09/2020 02/23/2020 08/31/2019 08/31/2019 02/02/2019 08/04/2018 02/24/2018  PHQ - 2 Score 0 2 0 0 0 0 0  PHQ- 9 Score - 2 0 0 - 0 0    Fall Risk Fall Risk  09/09/2020 02/23/2020 08/31/2019 08/31/2019 02/02/2019  Falls in the past year? 0 0 1 1 0  Number falls in past yr: 0 0 0 0 -  Injury with Fall? 0 0 1 1 -  Comment - - - - -  Risk for fall due to : - - - - -  Risk for fall due to: Comment - - - - -  Follow up Falls prevention discussed Falls evaluation completed Falls evaluation completed Falls evaluation completed -    Any stairs in or around the home? Yes  If so, are there any without handrails? No  Home free of loose throw rugs in walkways, pet beds, electrical cords, etc? Yes  Adequate lighting in your home to reduce risk of falls? Yes   ASSISTIVE DEVICES UTILIZED TO PREVENT FALLS:  Life alert? No  Use of a cane, walker or w/c? No  Grab bars in the bathroom? No  Shower chair or bench in shower? Yes  Elevated toilet seat or a handicapped toilet? No   TIMED UP AND GO:  Was the test performed? No . Phone viist   Cognitive Function:No cognitive impairment noted.    Montreal Cognitive Assessment  12/28/2016  Visuospatial/ Executive (0/5) 5  Naming (0/3) 3  Attention: Read list of digits (0/2) 2  Attention: Read list of letters (0/1) 1  Attention: Serial 7 subtraction starting at 100 (0/3) 3  Language: Repeat phrase (0/2) 2  Language : Fluency (0/1) 1  Abstraction (0/2) 2  Delayed Recall (0/5) 4  Orientation (0/6) 6  Total 29  Adjusted Score (based on education) 29  Immunizations Immunization History  Administered Date(s) Administered   Fluad Quad(high Dose 65+) 08/31/2019   Hepatitis A 04/20/2011   Influenza Split 10/29/2011   Influenza, High Dose Seasonal PF 08/24/2017, 08/04/2018, 09/03/2020   Influenza,inj,Quad PF,6+ Mos 08/14/2014, 08/20/2015, 08/06/2016   Moderna SARS-COVID-2 Vaccination 12/27/2019, 01/25/2020   Pneumococcal Conjugate-13 08/20/2015   Pneumococcal Polysaccharide-23 05/22/2013   Tdap 08/04/2018   Typhoid Parenteral 04/20/2011   Zoster 11/30/2004   Zoster Recombinat (Shingrix) 03/12/2018, 06/10/2018    TDAP status: Up to date   Flu Vaccine status: Up to date   Pneumococcal vaccine status: Up to date   Covid-19 vaccine status: Completed vaccines  Qualifies for Shingles Vaccine? No   Zostavax completed Yes   Shingrix Completed?: Yes  Screening Tests Health Maintenance  Topic Date Due   MAMMOGRAM  10/19/2020   DEXA SCAN  10/19/2021   COLONOSCOPY  06/28/2025   TETANUS/TDAP  08/04/2028   INFLUENZA VACCINE  Completed   COVID-19 Vaccine  Completed   Hepatitis C Screening  Completed   PNA vac Low Risk Adult  Completed    Health Maintenance  There are no preventive care reminders to display for this patient.  Colorectal cancer screening: Completed 06/28/2020. Repeat every 5 years   Mammogram status: Completed 10/20/2019. Repeat every year Ordered today to be scheduled after 10/19/20.  Bone Density status: Completed 10/20/2019. Results reflect: Bone density results: NORMAL. Repeat every 2 years.  Lung Cancer Screening: (Low Dose CT Chest recommended if Age 64-80 years, 30 pack-year currently smoking OR have quit w/in 15years.) does not qualify.     Additional Screening:  Hepatitis C Screening: Completed 02/23/2020  Vision Screening: Recommended annual ophthalmology exams for early detection of glaucoma and other disorders of the eye. Is the patient up to date with their annual eye exam?   Yes  Who is the provider or what is the name of the office in which the patient attends annual eye exams? Dr. Valetta Close & Dr. Annamaria Boots   Dental Screening: Recommended annual dental exams for proper oral hygiene  Community Resource Referral / Chronic Care Management: CRR required this visit?  No   CCM required this visit?  No      Plan:     I have personally reviewed and noted the following in the patients chart:    Medical and social history  Use of alcohol, tobacco or illicit drugs   Current medications and supplements  Functional ability and status  Nutritional status  Physical activity  Advanced directives  List of other physicians  Hospitalizations, surgeries, and ER visits in previous 12 months  Vitals  Screenings to include cognitive, depression, and falls  Referrals and appointments  In addition, I have reviewed and discussed with patient certain preventive protocols, quality metrics, and best practice recommendations. A written personalized care plan for preventive services as well as general preventive health recommendations were provided to patient.  Due to this being a telephonic visit, the after visit summary with patients personalized plan was offered to patient via mail or my-chart.  Patient would like to access on my-chart.   Marta Antu, LPN   [id number]  Nurse Health Advisor  Nurse Notes: None

## 2020-09-05 NOTE — Telephone Encounter (Signed)
Pt switched calls, dropped call, and hung up when called back when called to schedule HST

## 2020-09-09 ENCOUNTER — Ambulatory Visit (INDEPENDENT_AMBULATORY_CARE_PROVIDER_SITE_OTHER): Payer: PPO

## 2020-09-09 VITALS — Ht 65.0 in | Wt 173.0 lb

## 2020-09-09 DIAGNOSIS — Z0000 Encounter for general adult medical examination without abnormal findings: Secondary | ICD-10-CM

## 2020-09-09 DIAGNOSIS — Z1231 Encounter for screening mammogram for malignant neoplasm of breast: Secondary | ICD-10-CM

## 2020-09-09 NOTE — Patient Instructions (Signed)
Laura Stuart , Thank you for taking time to complete your Medicare Wellness Visit. I appreciate your ongoing commitment to your health goals. Please review the following plan we discussed and let me know if I can assist you in the future.   Screening recommendations/referrals: Colonoscopy: Completed 06/28/2020-No longer required after age 74 Mammogram: Completed 10/19/20-Ordered today. Someone will call you to schedule. Please schedule after 10/19/20. Bone Density: Completed 10/20/2019- Due 10/19/2021 Recommended yearly ophthalmology/optometry visit for glaucoma screening and checkup Recommended yearly dental visit for hygiene and checkup  Vaccinations: Influenza vaccine: Up to Date Pneumococcal vaccine: Completed vaccines Tdap vaccine: Up to Date- Due 08/04/2028 Shingles vaccine: Completed vaccines  Covid-19:Completed vaccines  Advanced directives: Please bring a copy for your chart.  Conditions/risks identified: See problem list  Next appointment: Follow up in one year for your annual wellness visit 09/15/2021 @ 3:00pm   Preventive Care 65 Years and Older, Female Preventive care refers to lifestyle choices and visits with your health care provider that can promote health and wellness. What does preventive care include?  A yearly physical exam. This is also called an annual well check.  Dental exams once or twice a year.  Routine eye exams. Ask your health care provider how often you should have your eyes checked.  Personal lifestyle choices, including:  Daily care of your teeth and gums.  Regular physical activity.  Eating a healthy diet.  Avoiding tobacco and drug use.  Limiting alcohol use.  Practicing safe sex.  Taking low-dose aspirin every day.  Taking vitamin and mineral supplements as recommended by your health care provider. What happens during an annual well check? The services and screenings done by your health care provider during your annual well check will  depend on your age, overall health, lifestyle risk factors, and family history of disease. Counseling  Your health care provider may ask you questions about your:  Alcohol use.  Tobacco use.  Drug use.  Emotional well-being.  Home and relationship well-being.  Sexual activity.  Eating habits.  History of falls.  Memory and ability to understand (cognition).  Work and work Statistician.  Reproductive health. Screening  You may have the following tests or measurements:  Height, weight, and BMI.  Blood pressure.  Lipid and cholesterol levels. These may be checked every 5 years, or more frequently if you are over 16 years old.  Skin check.  Lung cancer screening. You may have this screening every year starting at age 58 if you have a 30-pack-year history of smoking and currently smoke or have quit within the past 15 years.  Fecal occult blood test (FOBT) of the stool. You may have this test every year starting at age 20.  Flexible sigmoidoscopy or colonoscopy. You may have a sigmoidoscopy every 5 years or a colonoscopy every 10 years starting at age 67.  Hepatitis C blood test.  Hepatitis B blood test.  Sexually transmitted disease (STD) testing.  Diabetes screening. This is done by checking your blood sugar (glucose) after you have not eaten for a while (fasting). You may have this done every 1-3 years.  Bone density scan. This is done to screen for osteoporosis. You may have this done starting at age 5.  Mammogram. This may be done every 1-2 years. Talk to your health care provider about how often you should have regular mammograms. Talk with your health care provider about your test results, treatment options, and if necessary, the need for more tests. Vaccines  Your health care provider  may recommend certain vaccines, such as:  Influenza vaccine. This is recommended every year.  Tetanus, diphtheria, and acellular pertussis (Tdap, Td) vaccine. You may need a  Td booster every 10 years.  Zoster vaccine. You may need this after age 64.  Pneumococcal 13-valent conjugate (PCV13) vaccine. One dose is recommended after age 50.  Pneumococcal polysaccharide (PPSV23) vaccine. One dose is recommended after age 67. Talk to your health care provider about which screenings and vaccines you need and how often you need them. This information is not intended to replace advice given to you by your health care provider. Make sure you discuss any questions you have with your health care provider. Document Released: 12/13/2015 Document Revised: 08/05/2016 Document Reviewed: 09/17/2015 Elsevier Interactive Patient Education  2017 [place] Prevention in the Home Falls can cause injuries. They can happen to people of all ages. There are many things you can do to make your home safe and to help prevent falls. What can I do on the outside of my home?  Regularly fix the edges of walkways and driveways and fix any cracks.  Remove anything that might make you trip as you walk through a door, such as a raised step or threshold.  Trim any bushes or trees on the path to your home.  Use bright outdoor lighting.  Clear any walking paths of anything that might make someone trip, such as rocks or tools.  Regularly check to see if handrails are loose or broken. Make sure that both sides of any steps have handrails.  Any raised decks and porches should have guardrails on the edges.  Have any leaves, snow, or ice cleared regularly.  Use sand or salt on walking paths during winter.  Clean up any spills in your garage right away. This includes oil or grease spills. What can I do in the bathroom?  Use night lights.  Install grab bars by the toilet and in the tub and shower. Do not use towel bars as grab bars.  Use non-skid mats or decals in the tub or shower.  If you need to sit down in the shower, use a plastic, non-slip stool.  Keep the floor dry. Clean  up any water that spills on the floor as soon as it happens.  Remove soap buildup in the tub or shower regularly.  Attach bath mats securely with double-sided non-slip rug tape.  Do not have throw rugs and other things on the floor that can make you trip. What can I do in the bedroom?  Use night lights.  Make sure that you have a light by your bed that is easy to reach.  Do not use any sheets or blankets that are too big for your bed. They should not hang down onto the floor.  Have a firm chair that has side arms. You can use this for support while you get dressed.  Do not have throw rugs and other things on the floor that can make you trip. What can I do in the kitchen?  Clean up any spills right away.  Avoid walking on wet floors.  Keep items that you use a lot in easy-to-reach places.  If you need to reach something above you, use a strong step stool that has a grab bar.  Keep electrical cords out of the way.  Do not use floor polish or wax that makes floors slippery. If you must use wax, use non-skid floor wax.  Do not have throw rugs  and other things on the floor that can make you trip. What can I do with my stairs?  Do not leave any items on the stairs.  Make sure that there are handrails on both sides of the stairs and use them. Fix handrails that are broken or loose. Make sure that handrails are as long as the stairways.  Check any carpeting to make sure that it is firmly attached to the stairs. Fix any carpet that is loose or worn.  Avoid having throw rugs at the top or bottom of the stairs. If you do have throw rugs, attach them to the floor with carpet tape.  Make sure that you have a light switch at the top of the stairs and the bottom of the stairs. If you do not have them, ask someone to add them for you. What else can I do to help prevent falls?  Wear shoes that:  Do not have high heels.  Have rubber bottoms.  Are comfortable and fit you well.  Are  closed at the toe. Do not wear sandals.  If you use a stepladder:  Make sure that it is fully opened. Do not climb a closed stepladder.  Make sure that both sides of the stepladder are locked into place.  Ask someone to hold it for you, if possible.  Clearly mark and make sure that you can see:  Any grab bars or handrails.  First and last steps.  Where the edge of each step is.  Use tools that help you move around (mobility aids) if they are needed. These include:  Canes.  Walkers.  Scooters.  Crutches.  Turn on the lights when you go into a dark area. Replace any light bulbs as soon as they burn out.  Set up your furniture so you have a clear path. Avoid moving your furniture around.  If any of your floors are uneven, fix them.  If there are any pets around you, be aware of where they are.  Review your medicines with your doctor. Some medicines can make you feel dizzy. This can increase your chance of falling. Ask your doctor what other things that you can do to help prevent falls. This information is not intended to replace advice given to you by your health care provider. Make sure you discuss any questions you have with your health care provider. Document Released: 09/12/2009 Document Revised: 04/23/2016 Document Reviewed: 12/21/2014 Elsevier Interactive Patient Education  2017 [employer].

## 2020-09-16 ENCOUNTER — Ambulatory Visit (INDEPENDENT_AMBULATORY_CARE_PROVIDER_SITE_OTHER): Payer: PPO | Admitting: Neurology

## 2020-09-16 DIAGNOSIS — G4733 Obstructive sleep apnea (adult) (pediatric): Secondary | ICD-10-CM

## 2020-09-16 DIAGNOSIS — G500 Trigeminal neuralgia: Secondary | ICD-10-CM

## 2020-09-16 DIAGNOSIS — G4730 Sleep apnea, unspecified: Secondary | ICD-10-CM

## 2020-09-26 ENCOUNTER — Encounter: Payer: Self-pay | Admitting: Neurology

## 2020-09-26 DIAGNOSIS — Z9989 Dependence on other enabling machines and devices: Secondary | ICD-10-CM | POA: Insufficient documentation

## 2020-09-26 DIAGNOSIS — G4733 Obstructive sleep apnea (adult) (pediatric): Secondary | ICD-10-CM | POA: Insufficient documentation

## 2020-09-26 NOTE — Progress Notes (Signed)
Summary & Diagnosis:   This HST confirms the presence of Obstructive Sleep Apnea at a  mild degree, (AHI of 15.3/h) but with strong REM sleep  dependence, indicated by REM AHI of 42.2/h. there was  intermittent bradycardia noted. Hypoxia in sleep was not  sustained, total 02 desaturation time was 10.4 minutes with a  nadir SpO2 of 77%.   Recommendations:    Continued CPAP therapy is indicated - the patient has benefitted  from CPAP and can use her preferred mask. An autotitration device  with CPAP setting from 5-15 cm water, 2 cm EPR and heated  humidification.  Please note that CPAP machines have been on backorder due to  supply chain interruptions.   Interpreting Physician: Larey Seat, MD

## 2020-09-26 NOTE — Addendum Note (Signed)
Addended by: Larey Seat on: 09/26/2020 12:26 PM   Modules accepted: Orders

## 2020-09-26 NOTE — Procedures (Addendum)
Sleep Study Report   Patient Information     First Name: Laura Stuart Last Name: Stuart ID: [id number]  Birth Date: 2046-04-12 Age: 74 Gender: Female  Referred by:  Bobbie Stack   BMI: 29.0 (W=174 lbs, H=5' 5'')  ESS at 2/24 points.   Sleep Study Information    Study Date: 09/16/20 S/H/A Version: [phone] / 4.2.1023 / 79  History:    08-29-20, MM, NP [name] is a 74 year old female with a history of obstructive sleep apnea.  The patient returns today for a face-to-face visit in order to get a new machine.  She currently has an old machine.  We are unable to obtain any data.  She reports that she has been compliant with her CPAP, but she tries to use it nightly.  She does find it beneficial.  The patient had craniotomy for trigeminal neuralgia.  She states that since surgery she is not had any discomfort.  She does state that she has some numbness in the right side of the face and lack of taste in the right side of the mouth.   She is no longer on gabapentin.        Summary & Diagnosis:    This HST confirms the presence of Obstructive Sleep Apnea at a mild degree, (AHI of 15.3/h) but with strong REM sleep dependence, indicated by REM AHI of 42.2/h. there was intermittent bradycardia noted. Hypoxia in sleep was not sustained, total 02 desaturation time was 10.4 minutes with a nadir SpO2 of 77%.   Recommendations:      Continued CPAP therapy is indicated - the patient has benefitted from CPAP and can use her preferred mask. An autotitration device with CPAP setting from 5-15 cm water, 2 cm EPR and heated humidification.  Please note that CPAP machines have been on backorder due to supply chain interruptions.   Interpreting Physician: Larey Seat, MD           Sleep Summary  Oxygen Saturation Statistics   Start Study Time: End Study Time: Total Recording Time:          10:19:46 PM 5:43:05 AM   7 h, 23 min  Total Sleep Time % REM of Sleep Time:  5 h, 19 min  18.3     Mean: 94 Minimum: 77 Maximum: 98  Mean of Desaturations Nadirs (%):   88  Oxygen Desaturation. %:  4-9 10-20 >20 Total  Events Number Total   37  25 59.7 40.3  0 0.0  62 100.0  Oxygen Saturation: <90 <=88 <85 <80 <70  Duration (minutes): Sleep % 10.4 3.2 8.2 2.4 2.6 0.8 0.6 0.2 0.0 0.0     Respiratory Indices      Total Events REM NREM All Night  pRDI: pAHI 3%: ODI 4%: pAHIc 3%: % CSR: pAHI 4%:  96  81  62  0 0.0 63 44.2 42.2 39.1 0.0 12.3 9.3 5.6 0.0 18.1 15.3 11.7 0.0 11.9       Pulse Rate Statistics during Sleep (BPM)      Mean: 64 Minimum: 48 Maximum: 85    Indices are calculated using technically valid sleep time of 5 h, 17 min.                            pAHI=15.3  Mild              Moderate                    Severe                                                 5              15                    30   Body Position Statistics  Position Supine Prone Right Left Non-Supine  Sleep (min) 297.0 0.0 22.5 0.0 22.5  Sleep % 93.0 0.0 7.0 0.0 7.0  pRDI 19.1 N/A 5.4 N/A 5.4  pAHI 3% 16.5 N/A 0.0 N/A 0.0  ODI 4% 12.6 N/A 0.0 N/A 0.0         Right  Supine    Snoring Statistics Snoring Level (dB) >40 >50 >60 >70 >80 >Threshold (45)  Sleep (min) 97.4 4.4 1.7 0.0 0.0 7.3  Sleep % 30.5 1.4 0.5 0.0 0.0 2.3    Mean: 41 dB

## 2020-09-27 NOTE — Telephone Encounter (Signed)
Hi, Mrs. Picone- Yes, REM sleep increases the apnea frequency and that means it's not apnea depending on your airway but on your diaphragmatic strength. REM sleep is associated with decreased skeletal muscle strength and tone, so the ribcage is not moved and not helping you to breathe deeply in REM sleep.  We ordered the CPAP and it's on backlog - the order is in !  I send this message to my nurse, Myriam Jacobson, so she is informed about you waiting for CPAP.

## 2020-09-30 ENCOUNTER — Telehealth: Payer: Self-pay | Admitting: Neurology

## 2020-09-30 NOTE — Telephone Encounter (Signed)
-----   Message from Larey Seat, MD sent at 09/26/2020 12:26 PM EDT ----- Summary & Diagnosis:   This HST confirms the presence of Obstructive Sleep Apnea at a  mild degree, (AHI of 15.3/h) but with strong REM sleep  dependence, indicated by REM AHI of 42.2/h. there was  intermittent bradycardia noted. Hypoxia in sleep was not  sustained, total 02 desaturation time was 10.4 minutes with a  nadir SpO2 of 77%.   Recommendations:    Continued CPAP therapy is indicated - the patient has benefitted  from CPAP and can use her preferred mask. An autotitration device  with CPAP setting from 5-15 cm water, 2 cm EPR and heated  humidification.  Please note that CPAP machines have been on backorder due to  supply chain interruptions.   Interpreting Physician: Larey Seat, MD

## 2020-09-30 NOTE — Telephone Encounter (Signed)
Called patient to discuss sleep study results. No answer at this time. LVM for the patient to call back.   

## 2020-10-01 NOTE — Telephone Encounter (Signed)
Pt LVM returning phone call. Would like a call back from the nurse.

## 2020-10-01 NOTE — Telephone Encounter (Signed)
I called pt. I advised pt that Dr. Brett Fairy reviewed their sleep study results and found that pt has overall mild sleep apnea. Dr. Brett Fairy recommends that pt starts auto CPAP. I reviewed PAP compliance expectations with the pt. Pt is agreeable to starting a CPAP. I advised pt that an order will be sent to a DME, [facility] and [facility] will call the pt within about one week after they file with the pt's insurance. [facility]) will show the pt how to use the machine, fit for masks, and troubleshoot the CPAP if needed. A follow up apt will need to be made within 31-90 days. Pt verbalized understanding of results. Pt had no questions at this time but was encouraged to call back if questions arise. I have sent the order to [facility] and have received confirmation that they have received the order.

## 2020-10-02 ENCOUNTER — Ambulatory Visit (INDEPENDENT_AMBULATORY_CARE_PROVIDER_SITE_OTHER): Payer: PPO | Admitting: Family Medicine

## 2020-10-02 ENCOUNTER — Other Ambulatory Visit: Payer: Self-pay

## 2020-10-02 ENCOUNTER — Encounter: Payer: Self-pay | Admitting: Family Medicine

## 2020-10-02 VITALS — BP 117/68 | HR 64 | Temp 97.7°F | Resp 15 | Ht 64.0 in | Wt 173.0 lb

## 2020-10-02 DIAGNOSIS — E559 Vitamin D deficiency, unspecified: Secondary | ICD-10-CM | POA: Diagnosis not present

## 2020-10-02 DIAGNOSIS — Z0000 Encounter for general adult medical examination without abnormal findings: Secondary | ICD-10-CM

## 2020-10-02 DIAGNOSIS — Z23 Encounter for immunization: Secondary | ICD-10-CM | POA: Diagnosis not present

## 2020-10-02 DIAGNOSIS — E785 Hyperlipidemia, unspecified: Secondary | ICD-10-CM | POA: Diagnosis not present

## 2020-10-02 LAB — CBC WITH DIFFERENTIAL/PLATELET
Basophils Absolute: 0.1 10*3/uL (ref 0.0–0.1)
Basophils Relative: 1.1 % (ref 0.0–3.0)
Eosinophils Absolute: 0.1 10*3/uL (ref 0.0–0.7)
Eosinophils Relative: 2.3 % (ref 0.0–5.0)
HCT: 38.8 % (ref 36.0–46.0)
Hemoglobin: 13.1 g/dL (ref 12.0–15.0)
Lymphocytes Relative: 26.9 % (ref 12.0–46.0)
Lymphs Abs: 1.6 10*3/uL (ref 0.7–4.0)
MCHC: 33.8 g/dL (ref 30.0–36.0)
MCV: 88.8 fl (ref 78.0–100.0)
Monocytes Absolute: 0.5 10*3/uL (ref 0.1–1.0)
Monocytes Relative: 8.1 % (ref 3.0–12.0)
Neutro Abs: 3.7 10*3/uL (ref 1.4–7.7)
Neutrophils Relative %: 61.6 % (ref 43.0–77.0)
Platelets: 216 10*3/uL (ref 150.0–400.0)
RBC: 4.38 Mil/uL (ref 3.87–5.11)
RDW: 13.8 % (ref 11.5–15.5)
WBC: 6 10*3/uL (ref 4.0–10.5)

## 2020-10-02 LAB — BASIC METABOLIC PANEL
BUN: 17 mg/dL (ref 6–23)
CO2: 30 mEq/L (ref 19–32)
Calcium: 9.5 mg/dL (ref 8.4–10.5)
Chloride: 105 mEq/L (ref 96–112)
Creatinine, Ser: 0.76 mg/dL (ref 0.40–1.20)
GFR: 77.19 mL/min (ref >60.00)
Glucose, Bld: 75 mg/dL (ref 70–99)
Potassium: 4.3 mEq/L (ref 3.5–5.1)
Sodium: 141 mEq/L (ref 135–145)

## 2020-10-02 LAB — HEPATIC FUNCTION PANEL
ALT: 19 U/L (ref 0–35)
AST: 26 U/L (ref 0–37)
Albumin: 4.2 g/dL (ref 3.5–5.2)
Alkaline Phosphatase: 73 U/L (ref 39–117)
Bilirubin, Direct: 0.1 mg/dL (ref 0.0–0.3)
Total Bilirubin: 0.5 mg/dL (ref 0.2–1.2)
Total Protein: 6.4 g/dL (ref 6.0–8.3)

## 2020-10-02 LAB — LIPID PANEL
Cholesterol: 175 mg/dL (ref 0–200)
HDL: 67.8 mg/dL (ref >39.00)
LDL Cholesterol: 92 mg/dL (ref 0–99)
NonHDL: 107.53
Total CHOL/HDL Ratio: 3
Triglycerides: 78 mg/dL (ref 0.0–149.0)
VLDL: 15.6 mg/dL (ref 0.0–40.0)

## 2020-10-02 LAB — VITAMIN D 25 HYDROXY (VIT D DEFICIENCY, FRACTURES): VITD: 88.77 ng/mL (ref 30.00–100.00)

## 2020-10-02 LAB — TSH: TSH: 2.15 u[IU]/mL (ref 0.35–4.50)

## 2020-10-02 NOTE — Assessment & Plan Note (Signed)
Chronic problem.  Tolerating statin w/o difficulty.  Check labs.  Adjust meds prn  

## 2020-10-02 NOTE — Patient Instructions (Addendum)
Follow up in 6 months to recheck cholesterol We'll notify you of your lab results and make any changes if needed Continue to work on healthy diet and regular exercise- you're doing great! Call with any questions or concerns Stay Safe!  Stay Healthy! Happy Holidays!!! 

## 2020-10-02 NOTE — Progress Notes (Signed)
   Subjective:    Patient ID: Laura Stuart, female    DOB: 01-03-1946, 74 y.o.   MRN: [id number]  HPI CPE- UTD on mammo, colonoscopy, DEXA, Tdap, flu, pneumonia vaccines, and COVID vaccine.  Reviewed past medical, surgical, family and social histories.   Patient Care Team    Relationship Specialty Notifications Start End  Midge Minium, MD PCP - General Family Medicine  02/04/12    Comment: Jones Skene, Asencion Partridge, MD Consulting Physician Neurology  08/20/15   Irene Shipper, MD Consulting Physician Gastroenterology  08/20/15   Garrel Ridgel, [place] Consulting Physician Podiatry  08/24/17     Health Maintenance  Topic Date Due  . MAMMOGRAM  10/19/2020  . DEXA SCAN  10/19/2021  . COLONOSCOPY  06/28/2025  . TETANUS/TDAP  08/04/2028  . INFLUENZA VACCINE  Completed  . COVID-19 Vaccine  Completed  . Hepatitis C Screening  Completed  . PNA vac Low Risk Adult  Completed      Review of Systems Patient reports no vision/ hearing changes, adenopathy,fever, weight change,  persistant/recurrent hoarseness , swallowing issues, chest pain, edema, persistant/recurrent cough, hemoptysis, dyspnea (rest/exertional/paroxysmal nocturnal), gastrointestinal bleeding (melena, rectal bleeding), abdominal pain, significant heartburn, bowel changes, GU symptoms (dysuria, hematuria, incontinence), Gyn symptoms (abnormal  bleeding, pain),  syncope, focal weakness, memory loss, numbness & tingling, skin/hair/nail changes, abnormal bruising or bleeding, anxiety, or depression.   + occasional palpitations  This visit occurred during the SARS-CoV-2 public health emergency.  Safety protocols were in place, including screening questions prior to the visit, additional usage of staff PPE, and extensive cleaning of exam room while observing appropriate contact time as indicated for disinfecting solutions.       Objective:   Physical Exam General Appearance:    Alert, cooperative, no distress, appears stated  age  Head:    Normocephalic, without obvious abnormality, atraumatic  Eyes:    PERRL, conjunctiva/corneas clear, EOM's intact, fundi    benign, both eyes  Ears:    Deferred due to COVID  Nose:   Throat:   Lips, mucosa, and tongue normal; teeth and gums normal  Neck:   Supple, symmetrical, trachea midline, no adenopathy;    Thyroid: no enlargement/tenderness/nodules  Back:     Symmetric, no curvature, ROM normal, no CVA tenderness  Lungs:     Clear to auscultation bilaterally, respirations unlabored  Chest Wall:    No tenderness or deformity   Heart:    Regular rate and rhythm, S1 and S2 normal, no murmur, rub   or gallop  Breast Exam:    Deferred to GYN  Abdomen:     Soft, non-tender, bowel sounds active all four quadrants,    no masses, no organomegaly  Genitalia:    Deferred to GYN  Rectal:    Extremities:   Extremities normal, atraumatic, no cyanosis or edema  Pulses:   2+ and symmetric all extremities  Skin:   Skin color, texture, turgor normal, no rashes or lesions  Lymph nodes:   Cervical, supraclavicular, and axillary nodes normal  Neurologic:   CNII-XII intact, normal strength, sensation and reflexes    throughout         Assessment & Plan:

## 2020-10-02 NOTE — Assessment & Plan Note (Signed)
Check labs and replete prn. 

## 2020-10-02 NOTE — Assessment & Plan Note (Signed)
Pt's PE WNL.  UTD on mammogram, colonoscopy, immunizations.  Check labs.  Anticipatory guidance provided.

## 2020-12-02 ENCOUNTER — Other Ambulatory Visit: Payer: Self-pay | Admitting: *Deleted

## 2020-12-02 MED ORDER — ZOLPIDEM TARTRATE 10 MG PO TABS: 10.0000 mg | ORAL_TABLET | Freq: Every evening | ORAL | 5 refills | Status: DC | PRN

## 2021-01-08 ENCOUNTER — Ambulatory Visit: Payer: PPO | Admitting: Adult Health

## 2021-01-23 DIAGNOSIS — H524 Presbyopia: Secondary | ICD-10-CM | POA: Diagnosis not present

## 2021-01-23 DIAGNOSIS — H5010 Unspecified exotropia: Secondary | ICD-10-CM | POA: Diagnosis not present

## 2021-01-23 DIAGNOSIS — H2513 Age-related nuclear cataract, bilateral: Secondary | ICD-10-CM | POA: Diagnosis not present

## 2021-02-19 ENCOUNTER — Encounter (HOSPITAL_BASED_OUTPATIENT_CLINIC_OR_DEPARTMENT_OTHER): Payer: Self-pay | Admitting: Emergency Medicine

## 2021-02-19 ENCOUNTER — Telehealth: Payer: Self-pay | Admitting: Family Medicine

## 2021-02-19 ENCOUNTER — Other Ambulatory Visit: Payer: Self-pay

## 2021-02-19 ENCOUNTER — Emergency Department (HOSPITAL_BASED_OUTPATIENT_CLINIC_OR_DEPARTMENT_OTHER): Payer: PPO

## 2021-02-19 ENCOUNTER — Emergency Department (HOSPITAL_BASED_OUTPATIENT_CLINIC_OR_DEPARTMENT_OTHER)
Admission: EM | Admit: 2021-02-19 | Discharge: 2021-02-19 | Disposition: A | Discharge status: Home / Self Care | Payer: PPO | Attending: Emergency Medicine | Admitting: Emergency Medicine

## 2021-02-19 DIAGNOSIS — M1711 Unilateral primary osteoarthritis, right knee: Secondary | ICD-10-CM | POA: Diagnosis not present

## 2021-02-19 DIAGNOSIS — M25561 Pain in right knee: Secondary | ICD-10-CM

## 2021-02-19 DIAGNOSIS — M25461 Effusion, right knee: Secondary | ICD-10-CM | POA: Diagnosis not present

## 2021-02-19 MED ORDER — DICLOFENAC SODIUM 1 % EX GEL: 2.0000 g | Freq: Four times a day (QID) | CUTANEOUS | 0 refills | Status: DC

## 2021-02-19 NOTE — ED Triage Notes (Signed)
Reports right knee pain for the last 5 days.  Hx of meniscus tear.  Reports it feels the same.  Knee gives out at times.

## 2021-02-19 NOTE — ED Provider Notes (Signed)
[place] EMERGENCY DEPARTMENT Provider Note   CSN: [id number] Arrival date & time: 02/19/21  1337     History Chief Complaint  Patient presents with  . Knee Pain    Laura Stuart is a 75 y.o. female.  HPI 75 year old female with a history of hyperlipidemia, sleep apnea, trigeminal neuralgia presents to the ER with complaints of right knee pain.  Patient reports she has had 2 prior torn menisci in the past, states that she started to develop some right knee pain about 5 days ago.  No known inciting injury or fall.  She states that this feels similar to her prior torn meniscus.  She has been taking some Tylenol, wearing a knee brace that her husband has with little relief.  She has pain with ambulation.  She states that if she rests for several hours, and then walks she has been able to walk a little bit more pain-free.  However if she has prolonged physical activity, pain is so severe that she feels like she cannot walk.  Denies any numbness or tingling.  No swelling, redness.    Past Medical History:  Diagnosis Date  . Allergy   . Atypical facial pain   . Hyperlipidemia 08/24/2017  . Neuralgia    Trigeminal  . Sleep apnea    using cpap,OSA  . Trigeminal neuralgia    PAST HX- HAD CRANIOTOMY 08-14-2019 FIXED THIS     Patient Active Problem List   Diagnosis Date Noted  . OSA on CPAP 09/26/2020  . Monocular diplopia 04/10/2019  . Rhinitis 02/02/2019  . Hyperlipidemia 08/24/2017  . Vitamin D deficiency 08/24/2017  . TIA (transient ischemic attack) 02/26/2017  . 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 Sleep apnea with use of continuous positive airway pressure (CPAP) 07/26/2013  . Screening for malignant neoplasm of the cervix 10/29/2011  . Overweight 07/07/2011  . Osteopenia 07/07/2011  . Routine general medical examination at a health care facility 07/07/2011    Past Surgical History:  Procedure Laterality Date  . COLONOSCOPY    . FOOT SURGERY  Left 05/07/2017  . POLYPECTOMY    . ROOT CANAL  06/2013  . toes right foot Bilateral 2010   STRAIGHTENED BOTH FEET   . TONSILLECTOMY AND ADENOIDECTOMY  1950  . torn miniscus  2003   X2  . UPPER GASTROINTESTINAL ENDOSCOPY    . VAGINAL DELIVERY     X2     OB History   No obstetric history on file.     Family History  Problem Relation Age of Onset  . Stroke Mother   . Brain cancer Mother   . Lung cancer Mother   . Heart attack Mother   . Emphysema Father   . Alzheimer's disease Sister   . Dementia Other        evident after a fall with a hip fracture  . Colon cancer Neg Hx   . Colon polyps Neg Hx   . Esophageal cancer Neg Hx   . Rectal cancer Neg Hx   . Stomach cancer Neg Hx     Social History   Tobacco Use  . Smoking status: Never Smoker  . Smokeless tobacco: Never Used  Substance Use Topics  . Alcohol use: Yes    Alcohol/week: 0.0 standard drinks    Comment: 1 glass every 2 months; socially  . Drug use: No    Home Medications Prior to Admission medications   Medication Sig Start Date End Date Taking? Authorizing Provider  diclofenac Sodium (VOLTAREN) 1 % GEL Apply 2 g topically 4 (four) times daily. 02/19/21  Yes Sharyn Lull A, PA-C  atorvastatin (LIPITOR) 10 MG tablet TAKE 1 TABLET BY MOUTH DAILY AT 6PM Patient taking differently: THREE TIMES A WEEK 02/28/20   Midge Minium, MD  Calcium Carb-Cholecalciferol (CALCIUM CARBONATE-VITAMIN D3 PO) Take 1 tablet by mouth daily. 1200 mg daily WITH 5000 IU D    [provider]  cetirizine (ZYRTEC) 10 MG tablet TAKE 1 TABLET BY MOUTH DAILY 03/20/20   Midge Minium, MD  fluticasone [facility]) 50 MCG/ACT nasal spray INSTILL 2 SPRAYS INTO EACH NOSTRIL DAILY 03/20/20   Midge Minium, MD  glucosamine-chondroitin 500-400 MG tablet Take 1 tablet by mouth 3 (three) times daily.    [provider]  zolpidem (AMBIEN) 10 MG tablet Take 1 tablet (10 mg total) by mouth at bedtime as needed for sleep.  12/02/20   Ward Givens, NP    Allergies    Penicillins  Review of Systems   Review of Systems  Musculoskeletal: Positive for arthralgias.  Neurological: Negative for weakness and numbness.    Physical Exam Updated Vital Signs BP (!) 122/53 (BP Location: Left Arm)   Pulse 72   Temp 97.7 F (36.5 C) (Oral)   Resp 20   Ht 5\' 4"  (1.626 m)   Wt 78 kg   SpO2 100%   BMI 29.52 kg/m   Physical Exam Vitals and nursing note reviewed.  Constitutional:      General: She is not in acute distress.    Appearance: She is well-developed.  HENT:     Head: Normocephalic and atraumatic.  Eyes:     Conjunctiva/sclera: Conjunctivae normal.  Cardiovascular:     Rate and Rhythm: Normal rate and regular rhythm.     Heart sounds: No murmur heard.   Pulmonary:     Effort: Pulmonary effort is normal. No respiratory distress.     Breath sounds: Normal breath sounds.  Abdominal:     Palpations: Abdomen is soft.     Tenderness: There is no abdominal tenderness.  Musculoskeletal:        General: Tenderness present. No swelling.     Cervical back: Neck supple.     Comments: Right knee with mild effusion.  Tenderness to palpation superior and inferior to the patella.  No tibial plateau tenderness.  Full flexion and extension of the right knee, 5/5 strength with flexion and extension.  Neurovascularly intact.  No evidence of warmth, drainage.  Skin:    General: Skin is warm and dry.     Coloration: Skin is not pale.     Findings: No bruising or erythema.  Neurological:     General: No focal deficit present.     Mental Status: She is alert and oriented to person, place, and time.     Sensory: No sensory deficit.     Motor: No weakness.     ED Results / Procedures / Treatments   Labs (all labs ordered are listed, but only abnormal results are displayed) Labs Reviewed - No data to display  EKG None  Radiology DG Knee Complete 4 Views Right  Result Date: 02/19/2021 CLINICAL DATA:   Knee pain for several days, initial encounter EXAM: RIGHT KNEE - COMPLETE 4+ VIEW COMPARISON:  None. FINDINGS: Mild tricompartmental degenerative changes are noted. Tiny joint effusion is seen. No acute fracture or dislocation is noted. No other soft tissue abnormality is noted. IMPRESSION: Degenerative change with small joint  effusion. No other focal abnormality is noted. Electronically Signed   By: Inez Catalina M.D.   On: 02/19/2021 14:23    Procedures Procedures   Medications Ordered in ED Medications - No data to display  ED Course  I have reviewed the triage vital signs and the nursing notes.  Pertinent labs & imaging results that were available during my care of the patient were reviewed by me and considered in my medical decision making (see chart for details).    MDM Rules/Calculators/A&P                         75 year old female with complaints of right knee pain.  Physical exam with some tenderness superior and inferior to the patella.  No noticeable joint laxity.  Patient was able to bear weight and take several steps that with pain.  No evidence of tibial plateau tenderness.  Plain films without evidence of fractures.  Patient was offered a knee sleeve but she states that she has one at home.  Encourage Voltaren gel.  Offered crutches, she states that she has a walking stick that she can use at home.  Will refer to sports medicine.  Return precautions discussed.  Stable for discharge Final Clinical Impression(s) / ED Diagnoses Final diagnoses:  Acute pain of right knee    Rx / DC Orders ED Discharge Orders         Ordered    diclofenac Sodium (VOLTAREN) 1 % GEL  4 times daily        02/19/21 1436           Lyndel Safe 02/19/21 [place], [place], DO 02/19/21 1453

## 2021-02-19 NOTE — ED Notes (Signed)
Patient here for ortho referral, states she thought it was an urgent care, patient upset that her husband was made to wait in the parking lot while she was treated. Patient's husband was called and brought back while patient was being discharged.  Patient given discharge paperwork and ortho referral and taken by wheelchair to check out with registration.

## 2021-02-19 NOTE — Telephone Encounter (Signed)
Patient no longer wants referral - she is going to the ER

## 2021-02-19 NOTE — Telephone Encounter (Signed)
FYI

## 2021-02-19 NOTE — Telephone Encounter (Signed)
Patient would like a referral to an orthopedic doctor concerning her knee - she saw someone previously but she doesn't remember who or where.  Please advise

## 2021-02-19 NOTE — Discharge Instructions (Addendum)
Please apply the Voltaren gel to the right knee.  Please call Dr. Doristine Locks office and schedule an appointment.  Continue to wear the knee brace, use the walking stick.  Return to the ER for any new or worsening symptoms

## 2021-02-20 DIAGNOSIS — M25561 Pain in right knee: Secondary | ICD-10-CM | POA: Diagnosis not present

## 2021-02-26 DIAGNOSIS — H5021 Vertical strabismus, right eye: Secondary | ICD-10-CM | POA: Diagnosis not present

## 2021-02-26 DIAGNOSIS — H5034 Intermittent alternating exotropia: Secondary | ICD-10-CM | POA: Diagnosis not present

## 2021-03-25 DIAGNOSIS — G4733 Obstructive sleep apnea (adult) (pediatric): Secondary | ICD-10-CM | POA: Diagnosis not present

## 2021-04-03 ENCOUNTER — Encounter: Payer: Self-pay | Admitting: Family Medicine

## 2021-04-03 ENCOUNTER — Other Ambulatory Visit: Payer: Self-pay

## 2021-04-03 ENCOUNTER — Ambulatory Visit (INDEPENDENT_AMBULATORY_CARE_PROVIDER_SITE_OTHER): Payer: PPO | Admitting: Family Medicine

## 2021-04-03 VITALS — BP 116/74 | HR 70 | Temp 97.4°F | Resp 18 | Ht 64.0 in | Wt 178.8 lb

## 2021-04-03 DIAGNOSIS — E785 Hyperlipidemia, unspecified: Secondary | ICD-10-CM | POA: Diagnosis not present

## 2021-04-03 DIAGNOSIS — R002 Palpitations: Secondary | ICD-10-CM | POA: Diagnosis not present

## 2021-04-03 DIAGNOSIS — E669 Obesity, unspecified: Secondary | ICD-10-CM | POA: Diagnosis not present

## 2021-04-03 LAB — CBC WITH DIFFERENTIAL/PLATELET
Basophils Absolute: 0.1 10*3/uL (ref 0.0–0.1)
Basophils Relative: 1.2 % (ref 0.0–3.0)
Eosinophils Absolute: 0.1 10*3/uL (ref 0.0–0.7)
Eosinophils Relative: 2.2 % (ref 0.0–5.0)
HCT: 41.4 % (ref 36.0–46.0)
Hemoglobin: 13.9 g/dL (ref 12.0–15.0)
Lymphocytes Relative: 22.5 % (ref 12.0–46.0)
Lymphs Abs: 1.1 10*3/uL (ref 0.7–4.0)
MCHC: 33.6 g/dL (ref 30.0–36.0)
MCV: 89 fl (ref 78.0–100.0)
Monocytes Absolute: 0.4 10*3/uL (ref 0.1–1.0)
Monocytes Relative: 8.3 % (ref 3.0–12.0)
Neutro Abs: 3.3 10*3/uL (ref 1.4–7.7)
Neutrophils Relative %: 65.8 % (ref 43.0–77.0)
Platelets: 259 10*3/uL (ref 150.0–400.0)
RBC: 4.66 Mil/uL (ref 3.87–5.11)
RDW: 14.2 % (ref 11.5–15.5)
WBC: 5.1 10*3/uL (ref 4.0–10.5)

## 2021-04-03 LAB — BASIC METABOLIC PANEL
BUN: 18 mg/dL (ref 6–23)
CO2: 28 mEq/L (ref 19–32)
Calcium: 9.6 mg/dL (ref 8.4–10.5)
Chloride: 105 mEq/L (ref 96–112)
Creatinine, Ser: 0.81 mg/dL (ref 0.40–1.20)
GFR: 71.25 mL/min (ref >60.00)
Glucose, Bld: 84 mg/dL (ref 70–99)
Potassium: 4.5 mEq/L (ref 3.5–5.1)
Sodium: 141 mEq/L (ref 135–145)

## 2021-04-03 LAB — LIPID PANEL
Cholesterol: 170 mg/dL (ref 0–200)
HDL: 60.7 mg/dL (ref >39.00)
LDL Cholesterol: 82 mg/dL (ref 0–99)
NonHDL: 109.32
Total CHOL/HDL Ratio: 3
Triglycerides: 135 mg/dL (ref 0.0–149.0)
VLDL: 27 mg/dL (ref 0.0–40.0)

## 2021-04-03 LAB — HEPATIC FUNCTION PANEL
ALT: 15 U/L (ref 0–35)
AST: 22 U/L (ref 0–37)
Albumin: 4 g/dL (ref 3.5–5.2)
Alkaline Phosphatase: 72 U/L (ref 39–117)
Bilirubin, Direct: 0.1 mg/dL (ref 0.0–0.3)
Total Bilirubin: 0.6 mg/dL (ref 0.2–1.2)
Total Protein: 6.2 g/dL (ref 6.0–8.3)

## 2021-04-03 LAB — TSH: TSH: 3.11 u[IU]/mL (ref 0.35–4.50)

## 2021-04-03 NOTE — Patient Instructions (Addendum)
Schedule your complete physical in 6 months We'll notify you of your lab results and make any changes if needed Continue to work on healthy diet and regular exercise- you can do it! Try and log your palpitations so we can see when and how frequently they occur.  If need be, we can refer to Cardiology. Call with any questions or concerns Stay Safe!  Stay Healthy! Happy Mother's Day!!

## 2021-04-03 NOTE — Assessment & Plan Note (Signed)
Chronic problem.  Tolerating Lipitor w/o difficulty.  Discussed need for healthy diet and regular exercise.  Check labs.  Adjust meds prn

## 2021-04-03 NOTE — Assessment & Plan Note (Signed)
New.  Pt has gained 6 lbs since last visit.  Stressed need for healthy diet and regular exercise.  Will continue to follow.

## 2021-04-03 NOTE — Progress Notes (Signed)
   Subjective:    Patient ID: Lucille Passy, female    DOB: 1946-01-14, 75 y.o.   MRN: [id number]  HPI Hyperlipidemia- chronic problem, pt is currently taking Lipitor 10mg  3x/week.  No abd pain, N/V.  Palpitations- pt reports she has not had sxs in the past '3 weeks or so' but over the last few months she has had palpitations- particularly when lying in bed.  Doesn't occur w/ activity- able to do stairs w/o difficulty.  Denies SOB.  Pt doesn't feel sxs are stress related.  Denies any GERD sxs.  No CP, no diaphoresis.  Obesity- pt has gained 6 lbs since last visit.  BMI is now 30.69  Very busy but no formal exercise.  Increased stress recently w/ caring for 75 yr old 3x/week   Review of Systems For ROS see HPI   This visit occurred during the SARS-CoV-2 public health emergency.  Safety protocols were in place, including screening questions prior to the visit, additional usage of staff PPE, and extensive cleaning of exam room while observing appropriate contact time as indicated for disinfecting solutions.       Objective:   Physical Exam Vitals reviewed.  Constitutional:      General: She is not in acute distress.    Appearance: Normal appearance. She is well-developed. She is not ill-appearing.  HENT:     Head: Normocephalic and atraumatic.  Eyes:     Conjunctiva/sclera: Conjunctivae normal.     Pupils: Pupils are equal, round, and reactive to light.  Neck:     Thyroid: No thyromegaly.  Cardiovascular:     Rate and Rhythm: Normal rate and regular rhythm.     Pulses: Normal pulses.     Heart sounds: Normal heart sounds. No murmur heard.   Pulmonary:     Effort: Pulmonary effort is normal. No respiratory distress.     Breath sounds: Normal breath sounds.  Abdominal:     General: There is no distension.     Palpations: Abdomen is soft.     Tenderness: There is no abdominal tenderness.  Musculoskeletal:     Cervical back: Normal range of motion and neck supple.     Right  lower leg: No edema.     Left lower leg: No edema.  Lymphadenopathy:     Cervical: No cervical adenopathy.  Skin:    General: Skin is warm and dry.  Neurological:     Mental Status: She is alert and oriented to person, place, and time.  Psychiatric:        Behavior: Behavior normal.           Assessment & Plan:  Palpitations- new.  Pt hasn't had sxs in >3 weeks so there is no point in doing an EKG today.  Currently asymptomatic.  Check labs to look for metabolic cause.  Encouraged pt to log sxs and if they return, we will refer to Cards.

## 2021-04-14 ENCOUNTER — Other Ambulatory Visit: Payer: Self-pay | Admitting: Family Medicine

## 2021-05-28 ENCOUNTER — Encounter: Payer: Self-pay | Admitting: *Deleted

## 2021-06-30 ENCOUNTER — Encounter: Payer: Self-pay | Admitting: Neurology

## 2021-07-03 ENCOUNTER — Other Ambulatory Visit: Payer: Self-pay | Admitting: Family Medicine

## 2021-07-03 ENCOUNTER — Other Ambulatory Visit: Payer: Self-pay

## 2021-07-03 MED ORDER — ATORVASTATIN CALCIUM 10 MG PO TABS: 10.0000 mg | ORAL_TABLET | Freq: Every day | ORAL | 1 refills | Status: DC

## 2021-07-03 NOTE — Telephone Encounter (Signed)
Medication sent to pharmacy at patient's request, but medication not due until the end of September.

## 2021-07-03 NOTE — Telephone Encounter (Signed)
Patient called in requesting this prescription - please send

## 2021-08-13 DIAGNOSIS — G4733 Obstructive sleep apnea (adult) (pediatric): Secondary | ICD-10-CM | POA: Diagnosis not present

## 2021-08-22 DIAGNOSIS — G4733 Obstructive sleep apnea (adult) (pediatric): Secondary | ICD-10-CM | POA: Diagnosis not present

## 2021-09-02 ENCOUNTER — Ambulatory Visit: Payer: PPO | Admitting: Adult Health

## 2021-09-02 ENCOUNTER — Encounter: Payer: Self-pay | Admitting: Adult Health

## 2021-09-02 VITALS — BP 126/68 | HR 82 | Ht 65.0 in | Wt 187.0 lb

## 2021-09-02 DIAGNOSIS — G4733 Obstructive sleep apnea (adult) (pediatric): Secondary | ICD-10-CM | POA: Diagnosis not present

## 2021-09-02 DIAGNOSIS — Z9989 Dependence on other enabling machines and devices: Secondary | ICD-10-CM | POA: Diagnosis not present

## 2021-09-02 NOTE — Progress Notes (Signed)
PATIENT: Laura Stuart DOB: 1945-12-30  REASON FOR VISIT: follow up HISTORY FROM: patient HISTORY OF PRESENT ILLNESS: Today 09/02/21:  Laura Stuart is a 75 year old female with a history of obstructive sleep apnea on CPAP.  The patient has obtained a new CPAP machine.  Her download is below.  She denies any new issues.      REVIEW OF SYSTEMS: Out of a complete 14 system review of symptoms, the patient complains only of the following symptoms, and all other reviewed systems are negative.   ESS 1  ALLERGIES: Allergies  Allergen Reactions   Penicillins Other (See Comments)    From allergy testing, unsure if allergy exists    HOME MEDICATIONS: Outpatient Medications Prior to Visit  Medication Sig Dispense Refill   atorvastatin (LIPITOR) 10 MG tablet Take 1 tablet (10 mg total) by mouth daily. TAKE 1 TABLET BY MOUTH DAILY AT 6PM (Patient taking differently: Take 10 mg by mouth 3 (three) times a week.) 90 tablet 1   Calcium Carb-Cholecalciferol (CALCIUM CARBONATE-VITAMIN D3 PO) Take 1 tablet by mouth daily. 1200 mg daily WITH 5000 IU D     cetirizine (ZYRTEC) 10 MG tablet TAKE 1 TABLET BY MOUTH DAILY 30 tablet 6   fluticasone (FLONASE) 50 MCG/ACT nasal spray INSTILL 2 SPRAYS INTO EACH NOSTRIL DAILY 16 g 6   glucosamine-chondroitin 500-400 MG tablet Take 1 tablet by mouth 3 (three) times daily.     No facility-administered medications prior to visit.    PAST MEDICAL HISTORY: Past Medical History:  Diagnosis Date   Allergy    Atypical facial pain    Hyperlipidemia 08/24/2017   Neuralgia    Trigeminal   Sleep apnea    using cpap,OSA   Trigeminal neuralgia    PAST HX- HAD CRANIOTOMY 08-14-2019 FIXED THIS     PAST SURGICAL HISTORY: Past Surgical History:  Procedure Laterality Date   COLONOSCOPY     FOOT SURGERY Left 05/07/2017   POLYPECTOMY     ROOT CANAL  06/2013   toes right foot Bilateral 2010   STRAIGHTENED BOTH FEET    TONSILLECTOMY AND ADENOIDECTOMY  1950    torn miniscus  2003   X2   UPPER GASTROINTESTINAL ENDOSCOPY     VAGINAL DELIVERY     X2    FAMILY HISTORY: Family History  Problem Relation Age of Onset   Stroke Mother    Brain cancer Mother    Lung cancer Mother    Heart attack Mother    Emphysema Father    Alzheimer's disease Sister    Dementia Other        evident after a fall with a hip fracture   Colon cancer Neg Hx    Colon polyps Neg Hx    Esophageal cancer Neg Hx    Rectal cancer Neg Hx    Stomach cancer Neg Hx     SOCIAL HISTORY: Social History   Socioeconomic History   Marital status: Married    Spouse name: [name]   Number of children: 2   Years of education: Grad sch.   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   Patient has two adult children.   Patient is retired.   Patient has a Production designer, theatre/television/film.   Patient is right-handed.   Patient drinks 5 cups of caffeine/week, at home drinks decaf coffee.   Social Determinants of Health   Financial Resource Strain: Low Risk    Difficulty of Paying Living Expenses: Not hard at all  Food Insecurity: No Food Insecurity   Worried About Charity fundraiser in the Last Year: Never true   [place] in the Last Year: Never true  Transportation Needs: No Transportation Needs   Lack of Transportation (Medical): No   Lack of Transportation (Non-Medical): No  Physical Activity: Insufficiently Active   Days of Exercise per Week: 4  days   Minutes of Exercise per Session: 30 min  Stress: No Stress Concern Present   Feeling of Stress : Not at all  Social Connections: Socially Integrated   Frequency of Communication with Friends and Family: More than three times a week   Frequency of Social Gatherings with Friends and Family: More than three times a week   Attends Religious Services: More than 4 times per year   Active Member of [employer] or Organizations: Yes   Attends Music therapist: More than 4 times per year   Marital Status: Married  Human resources officer Violence: Not At Risk   Fear of Current or Ex-Partner: No   Emotionally Abused: No   Physically Abused: No   Sexually Abused: No      PHYSICAL EXAM  Vitals:   09/02/21 1438  BP: 126/68  Pulse: 82  Weight: 187 lb (84.8 kg)  Height: 5\' 5"  (1.651 m)   Body mass index is 31.12 kg/m.  Generalized: Well developed, in no acute distress  Chest: Lungs clear to auscultation bilaterally  Neurological examination  Mentation: Alert oriented to time, place, history taking. Follows all commands speech and language fluent Cranial nerve II-XII: Extraocular movements were full, visual field were full on confrontational test Head turning and shoulder shrug  were normal and symmetric. Motor: The motor testing reveals 5 over 5 strength of all 4 extremities. Good symmetric motor tone is noted throughout.  Sensory: Sensory testing is intact to soft touch on all 4 extremities. No evidence of extinction is noted.  Gait and station: Gait is normal.    DIAGNOSTIC DATA (LABS, IMAGING, TESTING) - I reviewed patient records, labs, notes, testing and imaging myself where available.  Lab Results  Component Value Date   WBC 5.1 04/03/2021   HGB 13.9 04/03/2021   HCT 41.4 04/03/2021   MCV 89.0 04/03/2021   PLT 259.0 04/03/2021      Component Value Date/Time   NA 141 04/03/2021 1124   NA 139 04/11/2019 0925   K 4.5 04/03/2021 1124   CL 105 04/03/2021 1124    CO2 28 04/03/2021 1124   GLUCOSE 84 04/03/2021 1124   BUN 18 04/03/2021 1124   BUN 14 04/11/2019 0925   CREATININE 0.81 04/03/2021 1124   CREATININE 0.79 02/05/2013 1117   CALCIUM 9.6 04/03/2021 1124   PROT 6.2 04/03/2021 1124   PROT 6.5 04/11/2019 0925   ALBUMIN 4.0 04/03/2021 1124   ALBUMIN 4.3 04/11/2019 0925   AST 22  04/03/2021 1124   ALT 15 04/03/2021 1124   ALKPHOS 72 04/03/2021 1124   BILITOT 0.6 04/03/2021 1124   BILITOT 0.2 04/11/2019 0925   GFRNONAA 79 04/11/2019 0925   GFRAA 91 04/11/2019 0925   Lab Results  Component Value Date   CHOL 170 04/03/2021   HDL 60.70 04/03/2021   LDLCALC 82 04/03/2021   LDLDIRECT 106.9 05/22/2013   TRIG 135.0 04/03/2021   CHOLHDL 3 04/03/2021   Lab Results  Component Value Date   HGBA1C 5.5 02/27/2017   No results found for: VITAMINB12 Lab Results  Component Value Date   TSH 3.11 04/03/2021      ASSESSMENT AND PLAN 75 y.o. year old female  has a past medical history of Allergy, Atypical facial pain, Hyperlipidemia (08/24/2017), Neuralgia, Sleep apnea, and Trigeminal neuralgia. here with:  OSA on CPAP  - CPAP compliance excellent - Good treatment of AHI  - Encourage patient to use CPAP nightly and > 4 hours each night - F/U in 1 year or sooner if needed    Ward Givens, MSN, NP-C 09/02/2021, 2:43 PM [facility] [address] [place], [place] [address] [phone]

## 2021-09-02 NOTE — Patient Instructions (Signed)
Continue using CPAP nightly and greater than 4 hours each night °If your symptoms worsen or you develop new symptoms please let us know.  ° °

## 2021-09-10 ENCOUNTER — Other Ambulatory Visit: Payer: Self-pay | Admitting: Family Medicine

## 2021-09-10 ENCOUNTER — Other Ambulatory Visit: Payer: Self-pay | Admitting: Adult Health

## 2021-09-12 DIAGNOSIS — G4733 Obstructive sleep apnea (adult) (pediatric): Secondary | ICD-10-CM | POA: Diagnosis not present

## 2021-09-15 ENCOUNTER — Ambulatory Visit: Payer: PPO

## 2021-09-15 ENCOUNTER — Ambulatory Visit (INDEPENDENT_AMBULATORY_CARE_PROVIDER_SITE_OTHER): Payer: PPO | Admitting: *Deleted

## 2021-09-15 DIAGNOSIS — Z0000 Encounter for general adult medical examination without abnormal findings: Secondary | ICD-10-CM

## 2021-09-15 DIAGNOSIS — Z780 Asymptomatic menopausal state: Secondary | ICD-10-CM

## 2021-09-15 NOTE — Progress Notes (Signed)
Subjective:   Laura Stuart is a 75 y.o. female who presents for Medicare Annual (Subsequent) preventive examination.  I connected with  Laura Stuart on 09/15/21 by a telephone enabled telemedicine application and verified that I am speaking with the correct person using two identifiers.   I discussed the limitations of evaluation and management by telemedicine. The patient expressed understanding and agreed to proceed.   Review of Systems     Cardiac Risk Factors include: advanced age (>47men, >98 women);obesity (BMI >30kg/m2)     Objective:    Today's Vitals   There is no height or weight on file to calculate BMI.  Advanced Directives 09/15/2021 02/19/2021 09/09/2020 08/31/2019 08/24/2017 02/27/2017 02/26/2017  Does Patient Have a Medical Advance Directive? Yes Yes Yes Yes Yes No No  Type of Paramedic of [place];Living will [place];Living will [place];Living will Living will;Healthcare Power of Attorney - -  Does patient want to make changes to medical advance directive? - - Yes (MAU/Ambulatory/Procedural Areas - Information given) Yes (MAU/Ambulatory/Procedural Areas - Information given) - - -  Copy of [place] in Chart? No - copy requested - No - copy requested No - copy requested No - copy requested - -  Would patient like information on creating a medical advance directive? - - - - - No - Patient declined No - Patient declined    Current Medications (verified) Outpatient Encounter Medications as of 09/15/2021  Medication Sig   atorvastatin (LIPITOR) 10 MG tablet Take 1 tablet (10 mg total) by mouth daily. TAKE 1 TABLET BY MOUTH DAILY AT 6PM (Patient taking differently: Take 10 mg by mouth 3 (three) times a week.)   Calcium Carb-Cholecalciferol (CALCIUM CARBONATE-VITAMIN D3 PO) Take 1 tablet by mouth daily. 1200 mg daily WITH 5000 IU D   cetirizine  (ZYRTEC) 10 MG tablet TAKE 1 TABLET BY MOUTH DAILY   fluticasone (FLONASE) 50 MCG/ACT nasal spray INSTILL 2 SPRAYS INTO EACH NOSTRIL DAILY   glucosamine-chondroitin 500-400 MG tablet Take 1 tablet by mouth 3 (three) times daily.   No facility-administered encounter medications on file as of 09/15/2021.    Allergies (verified) Penicillins   History: Past Medical History:  Diagnosis Date   Allergy    Atypical facial pain    Hyperlipidemia 08/24/2017   Neuralgia    Trigeminal   Sleep apnea    using cpap,OSA   Trigeminal neuralgia    PAST HX- HAD CRANIOTOMY 08-14-2019 FIXED THIS    Past Surgical History:  Procedure Laterality Date   COLONOSCOPY     FOOT SURGERY Left 05/07/2017   POLYPECTOMY     ROOT CANAL  06/2013   toes right foot Bilateral 2010   STRAIGHTENED BOTH FEET    TONSILLECTOMY AND ADENOIDECTOMY  1950   torn miniscus  2003   X2   UPPER GASTROINTESTINAL ENDOSCOPY     VAGINAL DELIVERY     X2   Family History  Problem Relation Age of Onset   Stroke Mother    Brain cancer Mother    Lung cancer Mother    Heart attack Mother    Emphysema Father    Alzheimer's disease Sister    Dementia Other        evident after a fall with a hip fracture   Colon cancer Neg Hx    Colon polyps Neg Hx    Esophageal cancer Neg Hx    Rectal cancer Neg Hx  Stomach cancer Neg Hx    Social History   Socioeconomic History   Marital status: Married    Spouse name: [name]   Number of children: 2   Years of education: Grad sch.   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   Patient has two adult children.   Patient is retired.   Patient has a Production designer, theatre/television/film.   Patient is right-handed.   Patient drinks 5 cups of caffeine/week, at home drinks decaf coffee.   Social Determinants of Health   Financial Resource Strain: Low Risk    Difficulty of Paying Living Expenses: Not hard at all  Food Insecurity: No Food Insecurity   Worried About Charity fundraiser in the Last Year: Never true   [place] in the Last Year: Never true  Transportation Needs: No Transportation Needs   Lack of Transportation (Medical): No   Lack of Transportation (Non-Medical): No  Physical Activity: Insufficiently Active   Days of Exercise per Week: 4 days   Minutes of Exercise per Session: 30 min  Stress: No Stress Concern Present   Feeling of Stress : Not at all  Social Connections: Socially Integrated   Frequency of Communication with Friends and Family: More than three times a week   Frequency of Social Gatherings with Friends and Family: More than three times a week   Attends Religious Services: More than 4 times per year   Active Member of [employer] or Organizations: Yes   Attends Archivist Meetings: 1 to 4 times per year   Marital Status: Married    Tobacco Counseling Counseling given: Not Answered   Clinical Intake:  Pre-visit preparation completed: Yes  Pain : No/denies pain     Nutritional Risks: None Diabetes: No  How often do you need to have someone help you when you read instructions, pamphlets, or other written materials from your doctor or pharmacy?: 1 - Never  Diabetic?  No   Interpreter Needed?: No  Information entered by :: Leroy Kennedy LPN   Activities of Daily Living In your present state of health, do you have any difficulty performing the following activities: 09/15/2021 04/03/2021  Hearing? N N  Vision? N  Y  Difficulty concentrating or making decisions? N N  Walking or climbing stairs? N N  Dressing or bathing? N N  Doing errands, shopping? N N  Preparing Food and eating ? N -  Using the Toilet? N -  In the past six months, have you accidently leaked urine? N -  Do you have problems with loss of bowel control? N -  Managing your Medications? N -  Managing your Finances? N -  Housekeeping or managing your Housekeeping? N -  Some recent data might be hidden    Patient Care Team: Midge Minium, MD as PCP - General (Family Medicine) [place],  Asencion Partridge, MD as Consulting Physician (Neurology) Irene Shipper, MD as Consulting Physician (Gastroenterology) Garrel Ridgel, DPM as Consulting Physician (Podiatry)  Indicate any recent Medical Services you may have received from other than [facility] providers in the past year (date may be approximate).     Assessment:   This is a routine wellness examination for Laura.  Hearing/Vision screen Hearing Screening - Comments:: No hearing loss Vision Screening - Comments:: Up to d date Dr. Delice Bison  Dietary issues and exercise activities discussed: Current Exercise Habits: Home exercise routine, Type of exercise: walking;strength training/weights, Time (Minutes): 40, Frequency (Times/Week): 3, Weekly Exercise (Minutes/Week): 120, Intensity: Mild   Goals Addressed             This Visit's Progress    Increase physical activity         Depression Screen PHQ 2/9 Scores 09/15/2021 04/03/2021 10/02/2020 09/09/2020 02/23/2020 08/31/2019 08/31/2019  PHQ - 2 Score 0 0 0 0 2 0 0  PHQ- 9 Score - 0 0 - 2 0 0    Fall Risk Fall Risk  09/15/2021 04/03/2021 10/02/2020 09/09/2020 02/23/2020  Falls in the past year? 0 0 0 0 0  Number falls in past yr: 0 0 0 0 0  Injury with Fall? 0 0 0 0 0  Comment - - - - -  Risk for fall due to : - No Fall Risks No Fall Risks - -  Risk for fall due to: Comment - - - - -  Follow up Falls evaluation  completed;Falls prevention discussed - - Falls prevention discussed Falls evaluation completed    FALL RISK PREVENTION PERTAINING TO THE HOME:  Any stairs in or around the home? Yes  If so, are there any without handrails? No  Home free of loose throw rugs in walkways, pet beds, electrical cords, etc? Yes  Adequate lighting in your home to reduce risk of falls? Yes   ASSISTIVE DEVICES UTILIZED TO PREVENT FALLS:  Life alert? No  Use of a cane, walker or w/c? No  Grab bars in the bathroom? No  Shower chair or bench in shower? Yes  Elevated toilet seat or a handicapped toilet? No   TIMED UP AND GO:  Was the test performed? No .    Cognitive Function:  Normal cognitive status assessed by direct observation by this Nurse Health Advisor. No abnormalities found.     Montreal Cognitive Assessment  12/28/2016  Visuospatial/ Executive (0/5) 5  Naming (0/3) 3  Attention: Read list of digits (0/2) 2  Attention: Read list of letters (0/1) 1  Attention: Serial 7 subtraction starting at 100 (0/3) 3  Language: Repeat phrase (0/2) 2  Language : Fluency (0/1) 1  Abstraction (0/2) 2  Delayed Recall (0/5) 4  Orientation (0/6) 6  Total 29  Adjusted Score (based on education) 29      Immunizations Immunization History  Administered Date(s) Administered   Fluad Quad(high Dose 65+) 08/31/2019, 09/11/2021   Hepatitis A 04/20/2011   Influenza Split 10/29/2011   Influenza, High Dose Seasonal PF 08/24/2017, 08/04/2018, 09/03/2020   Influenza,inj,Quad PF,6+ Mos 08/14/2014, 08/20/2015, 08/06/2016   Moderna SARS-COV2 Booster Vaccination 09/24/2020   Moderna Sars-Covid-2 Vaccination 12/27/2019, 01/25/2020, 03/19/2021   PFIZER(Purple Top)SARS-COV-2 Vaccination 08/14/2021   Pneumococcal Conjugate-13 08/20/2015   Pneumococcal Polysaccharide-23 05/22/2013   Tdap 08/04/2018   Typhoid Parenteral 04/20/2011   Zoster Recombinat (Shingrix) 03/12/2018, 06/10/2018   Zoster, Live 11/30/2004    TDAP  status: Up to date  Flu Vaccine status:  Up to date  Pneumococcal vaccine status: Up to date  Covid-19 vaccine status: Completed vaccines  Qualifies for Shingles Vaccine? No   Zostavax completed Yes   Shingrix Completed?: Yes  Screening Tests Health Maintenance  Topic Date Due   MAMMOGRAM  09/15/2022 (Originally 10/19/2020)   DEXA SCAN  10/19/2021   COLONOSCOPY (Pts 45-73yrs Insurance coverage will need to be confirmed)  06/28/2025   TETANUS/TDAP  08/04/2028   INFLUENZA VACCINE  Completed   COVID-19 Vaccine  Completed   Hepatitis C Screening  Completed   Zoster Vaccines- Shingrix  Completed   HPV VACCINES  Aged Out    Health Maintenance  There are no preventive care reminders to display for this patient.   Colorectal cancer screening: Type of screening: Colonoscopy. Completed 2021. Repeat every 5 years  Mammogram:  Declined  Bone Density status: Ordered  . Pt provided with contact info and advised to call to schedule appt.  Lung Cancer Screening: (Low Dose CT Chest recommended if Age 65-80 years, 30 pack-year currently smoking OR have quit w/in 15years.) does not qualify.   Lung Cancer Screening Referral:   Additional Screening:  Hepatitis C Screening: does not qualify; Completed 2021  Vision Screening: Recommended annual ophthalmology exams for early detection of glaucoma and other disorders of the eye. Is the patient up to date with their annual eye exam?  Yes  Who is the provider or what is the name of the office in which the patient attends annual eye exams? Dr. Delice Bison If pt is not established with a provider, would they like to be referred to a provider to establish care? No .   Dental Screening: Recommended annual dental exams for proper oral hygiene  Community Resource Referral / Chronic Care Management: CRR required this visit?  No   CCM required this visit?  No      Plan:     I have personally reviewed and noted the following in the patient's  chart:   Medical and social history Use of alcohol, tobacco or illicit drugs  Current medications and supplements including opioid prescriptions.  Functional ability and status Nutritional status Physical activity Advanced directives List of other physicians Hospitalizations, surgeries, and ER visits in previous 12 months Vitals Screenings to include cognitive, depression, and falls Referrals and appointments  In addition, I have reviewed and discussed with patient certain preventive protocols, quality metrics, and best practice recommendations. A written personalized care plan for preventive services as well as general preventive health recommendations were provided to patient.     Leroy Kennedy, LPN   [id number]   Nurse Notes:

## 2021-09-15 NOTE — Patient Instructions (Signed)
Laura Stuart , Thank you for taking time to come for your Medicare Wellness Visit. I appreciate your ongoing commitment to your health goals. Please review the following plan we discussed and let me know if I can assist you in the future.   Screening recommendations/referrals: Colonoscopy: up to date Mammogram: Education provided Bone Density: Education provided Recommended yearly ophthalmology/optometry visit for glaucoma screening and checkup Recommended yearly dental visit for hygiene and checkup  Vaccinations: Influenza vaccine: up to date Pneumococcal vaccine: up to date Tdap vaccine: up to date Shingles vaccine: up to date    Advanced directives: copy requested  Conditions/risks identified:   Next appointment: 10-08-2021 @ 9:00 Dr. Birdie Riddle   Preventive Care 75 Years and Older, Female Preventive care refers to lifestyle choices and visits with your health care provider that can promote health and wellness. What does preventive care include? A yearly physical exam. This is also called an annual well check. Dental exams once or twice a year. Routine eye exams. Ask your health care provider how often you should have your eyes checked. Personal lifestyle choices, including: Daily care of your teeth and gums. Regular physical activity. Eating a healthy diet. Avoiding tobacco and drug use. Limiting alcohol use. Practicing safe sex. Taking low-dose aspirin every day. Taking vitamin and mineral supplements as recommended by your health care provider. What happens during an annual well check? The services and screenings done by your health care provider during your annual well check will depend on your age, overall health, lifestyle risk factors, and family history of disease. Counseling  Your health care provider may ask you questions about your: Alcohol use. Tobacco use. Drug use. Emotional well-being. Home and relationship well-being. Sexual activity. Eating habits. History  of falls. Memory and ability to understand (cognition). Work and work Statistician. Reproductive health. Screening  You may have the following tests or measurements: Height, weight, and BMI. Blood pressure. Lipid and cholesterol levels. These may be checked every 5 years, or more frequently if you are over 48 years old. Skin check. Lung cancer screening. You may have this screening every year starting at age 98 if you have a 30-pack-year history of smoking and currently smoke or have quit within the past 15 years. Fecal occult blood test (FOBT) of the stool. You may have this test every year starting at age 21. Flexible sigmoidoscopy or colonoscopy. You may have a sigmoidoscopy every 5 years or a colonoscopy every 10 years starting at age 58. Hepatitis C blood test. Hepatitis B blood test. Sexually transmitted disease (STD) testing. Diabetes screening. This is done by checking your blood sugar (glucose) after you have not eaten for a while (fasting). You may have this done every 1-3 years. Bone density scan. This is done to screen for osteoporosis. You may have this done starting at age 29. Mammogram. This may be done every 1-2 years. Talk to your health care provider about how often you should have regular mammograms. Talk with your health care provider about your test results, treatment options, and if necessary, the need for more tests. Vaccines  Your health care provider may recommend certain vaccines, such as: Influenza vaccine. This is recommended every year. Tetanus, diphtheria, and acellular pertussis (Tdap, Td) vaccine. You may need a Td booster every 10 years. Zoster vaccine. You may need this after age 39. Pneumococcal 13-valent conjugate (PCV13) vaccine. One dose is recommended after age 55. Pneumococcal polysaccharide (PPSV23) vaccine. One dose is recommended after age 87. Talk to your health care provider  about which screenings and vaccines you need and how often you need  them. This information is not intended to replace advice given to you by your health care provider. Make sure you discuss any questions you have with your health care provider. Document Released: 12/13/2015 Document Revised: 08/05/2016 Document Reviewed: 09/17/2015 Elsevier Interactive Patient Education  2017 [place] Prevention in the Home Falls can cause injuries. They can happen to people of all ages. There are many things you can do to make your home safe and to help prevent falls. What can I do on the outside of my home? Regularly fix the edges of walkways and driveways and fix any cracks. Remove anything that might make you trip as you walk through a door, such as a raised step or threshold. Trim any bushes or trees on the path to your home. Use bright outdoor lighting. Clear any walking paths of anything that might make someone trip, such as rocks or tools. Regularly check to see if handrails are loose or broken. Make sure that both sides of any steps have handrails. Any raised decks and porches should have guardrails on the edges. Have any leaves, snow, or ice cleared regularly. Use sand or salt on walking paths during winter. Clean up any spills in your garage right away. This includes oil or grease spills. What can I do in the bathroom? Use night lights. Install grab bars by the toilet and in the tub and shower. Do not use towel bars as grab bars. Use non-skid mats or decals in the tub or shower. If you need to sit down in the shower, use a plastic, non-slip stool. Keep the floor dry. Clean up any water that spills on the floor as soon as it happens. Remove soap buildup in the tub or shower regularly. Attach bath mats securely with double-sided non-slip rug tape. Do not have throw rugs and other things on the floor that can make you trip. What can I do in the bedroom? Use night lights. Make sure that you have a light by your bed that is easy to reach. Do not use  any sheets or blankets that are too big for your bed. They should not hang down onto the floor. Have a firm chair that has side arms. You can use this for support while you get dressed. Do not have throw rugs and other things on the floor that can make you trip. What can I do in the kitchen? Clean up any spills right away. Avoid walking on wet floors. Keep items that you use a lot in easy-to-reach places. If you need to reach something above you, use a strong step stool that has a grab bar. Keep electrical cords out of the way. Do not use floor polish or wax that makes floors slippery. If you must use wax, use non-skid floor wax. Do not have throw rugs and other things on the floor that can make you trip. What can I do with my stairs? Do not leave any items on the stairs. Make sure that there are handrails on both sides of the stairs and use them. Fix handrails that are broken or loose. Make sure that handrails are as long as the stairways. Check any carpeting to make sure that it is firmly attached to the stairs. Fix any carpet that is loose or worn. Avoid having throw rugs at the top or bottom of the stairs. If you do have throw rugs, attach them to the floor  with carpet tape. Make sure that you have a light switch at the top of the stairs and the bottom of the stairs. If you do not have them, ask someone to add them for you. What else can I do to help prevent falls? Wear shoes that: Do not have high heels. Have rubber bottoms. Are comfortable and fit you well. Are closed at the toe. Do not wear sandals. If you use a stepladder: Make sure that it is fully opened. Do not climb a closed stepladder. Make sure that both sides of the stepladder are locked into place. Ask someone to hold it for you, if possible. Clearly mark and make sure that you can see: Any grab bars or handrails. First and last steps. Where the edge of each step is. Use tools that help you move around (mobility aids)  if they are needed. These include: Canes. Walkers. Scooters. Crutches. Turn on the lights when you go into a dark area. Replace any light bulbs as soon as they burn out. Set up your furniture so you have a clear path. Avoid moving your furniture around. If any of your floors are uneven, fix them. If there are any pets around you, be aware of where they are. Review your medicines with your doctor. Some medicines can make you feel dizzy. This can increase your chance of falling. Ask your doctor what other things that you can do to help prevent falls. This information is not intended to replace advice given to you by your health care provider. Make sure you discuss any questions you have with your health care provider. Document Released: 09/12/2009 Document Revised: 04/23/2016 Document Reviewed: 12/21/2014 Elsevier Interactive Patient Education  2017 [employer].

## 2021-09-17 ENCOUNTER — Other Ambulatory Visit: Payer: Self-pay | Admitting: Adult Health

## 2021-09-19 ENCOUNTER — Other Ambulatory Visit: Payer: Self-pay | Admitting: Adult Health

## 2021-10-01 ENCOUNTER — Telehealth: Payer: Self-pay

## 2021-10-01 ENCOUNTER — Other Ambulatory Visit: Payer: Self-pay

## 2021-10-01 NOTE — Telephone Encounter (Signed)
Caller name:Mary-Elisabeth Zani   On DPR? :yes  Call back number:[phone]  Provider they see: Birdie Riddle   Reason for call:Pt had the breast center call her about Bone density test to schedule appt and she want to go through [facility] pt said this is where she has all her records at and would like this changed.

## 2021-10-01 NOTE — Telephone Encounter (Signed)
Laura Stuart, Patient would like order changed from [facility] to [facility] because her records are already withy them.

## 2021-10-08 ENCOUNTER — Encounter: Payer: Self-pay | Admitting: Family Medicine

## 2021-10-08 ENCOUNTER — Ambulatory Visit (INDEPENDENT_AMBULATORY_CARE_PROVIDER_SITE_OTHER): Payer: PPO | Admitting: Family Medicine

## 2021-10-08 VITALS — BP 110/70 | HR 64 | Temp 98.2°F | Resp 17 | Ht 64.2 in | Wt 182.6 lb

## 2021-10-08 DIAGNOSIS — Z0000 Encounter for general adult medical examination without abnormal findings: Secondary | ICD-10-CM

## 2021-10-08 DIAGNOSIS — E785 Hyperlipidemia, unspecified: Secondary | ICD-10-CM | POA: Diagnosis not present

## 2021-10-08 DIAGNOSIS — E559 Vitamin D deficiency, unspecified: Secondary | ICD-10-CM | POA: Diagnosis not present

## 2021-10-08 LAB — CBC WITH DIFFERENTIAL/PLATELET
Basophils Absolute: 0 10*3/uL (ref 0.0–0.1)
Basophils Relative: 0.9 % (ref 0.0–3.0)
Eosinophils Absolute: 0.1 10*3/uL (ref 0.0–0.7)
Eosinophils Relative: 2.5 % (ref 0.0–5.0)
HCT: 41.6 % (ref 36.0–46.0)
Hemoglobin: 13.8 g/dL (ref 12.0–15.0)
Lymphocytes Relative: 25.2 % (ref 12.0–46.0)
Lymphs Abs: 1.2 10*3/uL (ref 0.7–4.0)
MCHC: 33 g/dL (ref 30.0–36.0)
MCV: 90.2 fl (ref 78.0–100.0)
Monocytes Absolute: 0.4 10*3/uL (ref 0.1–1.0)
Monocytes Relative: 9.1 % (ref 3.0–12.0)
Neutro Abs: 3.1 10*3/uL (ref 1.4–7.7)
Neutrophils Relative %: 62.3 % (ref 43.0–77.0)
Platelets: 216 10*3/uL (ref 150.0–400.0)
RBC: 4.62 Mil/uL (ref 3.87–5.11)
RDW: 14.1 % (ref 11.5–15.5)
WBC: 4.9 10*3/uL (ref 4.0–10.5)

## 2021-10-08 LAB — BASIC METABOLIC PANEL
BUN: 20 mg/dL (ref 6–23)
CO2: 29 mEq/L (ref 19–32)
Calcium: 9.3 mg/dL (ref 8.4–10.5)
Chloride: 105 mEq/L (ref 96–112)
Creatinine, Ser: 0.76 mg/dL (ref 0.40–1.20)
GFR: 76.64 mL/min (ref >60.00)
Glucose, Bld: 98 mg/dL (ref 70–99)
Potassium: 4.1 mEq/L (ref 3.5–5.1)
Sodium: 140 mEq/L (ref 135–145)

## 2021-10-08 LAB — HEPATIC FUNCTION PANEL
ALT: 14 U/L (ref 0–35)
AST: 21 U/L (ref 0–37)
Albumin: 4.2 g/dL (ref 3.5–5.2)
Alkaline Phosphatase: 71 U/L (ref 39–117)
Bilirubin, Direct: 0.1 mg/dL (ref 0.0–0.3)
Total Bilirubin: 0.6 mg/dL (ref 0.2–1.2)
Total Protein: 6.4 g/dL (ref 6.0–8.3)

## 2021-10-08 LAB — LIPID PANEL
Cholesterol: 167 mg/dL (ref 0–200)
HDL: 59.7 mg/dL (ref >39.00)
LDL Cholesterol: 87 mg/dL (ref 0–99)
NonHDL: 107.49
Total CHOL/HDL Ratio: 3
Triglycerides: 104 mg/dL (ref 0.0–149.0)
VLDL: 20.8 mg/dL (ref 0.0–40.0)

## 2021-10-08 LAB — TSH: TSH: 2.68 u[IU]/mL (ref 0.35–5.50)

## 2021-10-08 LAB — VITAMIN D 25 HYDROXY (VIT D DEFICIENCY, FRACTURES): VITD: 87.54 ng/mL (ref 30.00–100.00)

## 2021-10-08 MED ORDER — ZOLPIDEM TARTRATE 10 MG PO TABS: 10.0000 mg | ORAL_TABLET | Freq: Every evening | ORAL | 5 refills | Status: DC | PRN

## 2021-10-08 NOTE — Patient Instructions (Addendum)
Follow up in 1 year or as needed We'll notify you of your lab results and make any changes if needed Continue to work on healthy diet and regular exercise- you're doing great! Have [facility] send me a copy of their reports Call with any questions or concerns Stay Safe!  Stay healthy! Happy Holidays!!!

## 2021-10-08 NOTE — Assessment & Plan Note (Signed)
Chronic problem.  Tolerating statin w/o difficulty.  Check labs.  Adjust meds prn  

## 2021-10-08 NOTE — Assessment & Plan Note (Signed)
Pt's PE WNL.  UTD on colonoscopy, DEXA, Tdap, flu, PNA vaccines.  Repeat mammo scheduled for 11/15.  Check labs.  Anticipatory guidance provided.

## 2021-10-08 NOTE — Progress Notes (Signed)
   Subjective:    Patient ID: Laura Stuart, female    DOB: [birth year], 75 y.o.   MRN: [id number]  HPI CPE- UTD on colonoscopy, DEXA, Tdap, flu, PNA vaccines.  Due for repeat mammo- scheduled 11/15.  Exercising regularly  Patient Care Team    Relationship Specialty Notifications Start End  Midge Minium, MD PCP - General Family Medicine  02/04/12    Comment: Jones Skene, Asencion Partridge, MD Consulting Physician Neurology  08/20/15   Irene Shipper, MD Consulting Physician Gastroenterology  08/20/15   Garrel Ridgel, [place] Consulting Physician Podiatry  08/24/17     Health Maintenance  Topic Date Due   COVID-19 Vaccine (5 - Booster for Moderna series) 10/09/2021   MAMMOGRAM  09/15/2022 (Originally 10/19/2020)   DEXA SCAN  10/19/2021   COLONOSCOPY (Pts 45-25yrs Insurance coverage will need to be confirmed)  06/28/2025   TETANUS/TDAP  08/04/2028   Pneumonia Vaccine 38+ Years old  Completed   INFLUENZA VACCINE  Completed   Hepatitis C Screening  Completed   Zoster Vaccines- Shingrix  Completed   HPV VACCINES  Aged Out      Review of Systems Patient reports no vision/ hearing changes, adenopathy,fever, weight change,  persistant/recurrent hoarseness , swallowing issues, chest pain, palpitations, edema, persistant/recurrent cough, hemoptysis, dyspnea (rest/exertional/paroxysmal nocturnal), gastrointestinal bleeding (melena, rectal bleeding), abdominal pain, significant heartburn, bowel changes, GU symptoms (dysuria, hematuria, incontinence), Gyn symptoms (abnormal  bleeding, pain),  syncope, focal weakness, memory loss, numbness & tingling, skin/hair/nail changes, abnormal bruising or bleeding, anxiety, or depression.   This visit occurred during the SARS-CoV-2 public health emergency.  Safety protocols were in place, including screening questions prior to the visit, additional usage of staff PPE, and extensive cleaning of exam room while observing appropriate contact time as indicated for  disinfecting solutions.      Objective:   Physical Exam General Appearance:    Alert, cooperative, no distress, appears stated age  Head:    Normocephalic, without obvious abnormality, atraumatic  Eyes:    PERRL, conjunctiva/corneas clear, EOM's intact, fundi    benign, both eyes  Ears:    Normal TM's and external ear canals, both ears  Nose:   Deferred due to COVID  Throat:   Neck:   Supple, symmetrical, trachea midline, no adenopathy;    Thyroid: no enlargement/tenderness/nodules  Back:     Symmetric, no curvature, ROM normal, no CVA tenderness  Lungs:     Clear to auscultation bilaterally, respirations unlabored  Chest Wall:    No tenderness or deformity   Heart:    Regular rate and rhythm, S1 and S2 normal, no murmur, rub   or gallop  Breast Exam:    Deferred to mammo  Abdomen:     Soft, non-tender, bowel sounds active all four quadrants,    no masses, no organomegaly  Genitalia:    Deferred  Rectal:    Extremities:   Extremities normal, atraumatic, no cyanosis or edema  Pulses:   2+ and symmetric all extremities  Skin:   Skin color, texture, turgor normal, no rashes or lesions  Lymph nodes:   Cervical, supraclavicular, and axillary nodes normal  Neurologic:   CNII-XII intact, normal strength, sensation and reflexes    throughout          Assessment & Plan:

## 2021-10-08 NOTE — Assessment & Plan Note (Signed)
Check labs and replete prn. 

## 2021-10-13 DIAGNOSIS — G4733 Obstructive sleep apnea (adult) (pediatric): Secondary | ICD-10-CM | POA: Diagnosis not present

## 2021-10-14 DIAGNOSIS — Z1231 Encounter for screening mammogram for malignant neoplasm of breast: Secondary | ICD-10-CM | POA: Diagnosis not present

## 2021-10-14 LAB — HM MAMMOGRAPHY

## 2021-10-16 ENCOUNTER — Encounter: Payer: Self-pay | Admitting: Family Medicine

## 2021-10-17 ENCOUNTER — Telehealth: Payer: Self-pay | Admitting: *Deleted

## 2021-10-17 NOTE — Chronic Care Management (AMB) (Signed)
  Chronic Care Management   Note  10/17/2021 Name: Laura Stuart MRN: [id number] DOB: 1946-11-17  Laura Stuart is a 75 y.o. year old female who is a primary care patient of Birdie Riddle, Aundra Millet, MD. I reached out to [place] by phone today in response to a referral sent by Ms. Mary-Elisabeth Horstman's PCP.  Laura Stuart was given information about Chronic Care Management services today including:  CCM service includes personalized support from designated clinical staff supervised by her physician, including individualized plan of care and coordination with other care providers 24/7 contact phone numbers for assistance for urgent and routine care needs. Service will only be billed when office clinical staff spend 20 minutes or more in a month to coordinate care. Only one practitioner may furnish and bill the service in a calendar month. The patient may stop CCM services at any time (effective at the end of the month) by phone call to the office staff. The patient is responsible for co-pay (up to 20% after annual deductible is met) if co-pay is required by the individual health plan.   Patient agreed to services and verbal consent obtained.   Follow up plan: Telephone appointment with care management team member scheduled for:10/29/21  [place]: [phone]

## 2021-10-27 DIAGNOSIS — Z780 Asymptomatic menopausal state: Secondary | ICD-10-CM | POA: Diagnosis not present

## 2021-10-27 LAB — HM DEXA SCAN

## 2021-10-29 ENCOUNTER — Encounter: Payer: Self-pay | Admitting: Family Medicine

## 2021-10-29 ENCOUNTER — Ambulatory Visit (INDEPENDENT_AMBULATORY_CARE_PROVIDER_SITE_OTHER): Payer: PPO

## 2021-10-29 DIAGNOSIS — E785 Hyperlipidemia, unspecified: Secondary | ICD-10-CM

## 2021-10-29 DIAGNOSIS — G4733 Obstructive sleep apnea (adult) (pediatric): Secondary | ICD-10-CM

## 2021-10-29 DIAGNOSIS — Z9989 Dependence on other enabling machines and devices: Secondary | ICD-10-CM

## 2021-10-29 DIAGNOSIS — E559 Vitamin D deficiency, unspecified: Secondary | ICD-10-CM

## 2021-10-29 NOTE — Chronic Care Management (AMB) (Signed)
Chronic Care Management   CCM RN Visit Note  10/29/2021 Name: Laura Stuart MRN: [id number] DOB: 07-22-1946  Subjective: Laura Stuart is a 75 y.o. year old female who is a primary care patient of [place], Aundra Millet, MD. The care management team was consulted for assistance with disease management and care coordination needs.    Engaged with patient by telephone for initial visit in response to provider referral for case management and/or care coordination services.   Consent to Services:  The patient was given the following information about Chronic Care Management services today, agreed to services, and gave verbal consent: 1. CCM service includes personalized support from designated clinical staff supervised by the primary care provider, including individualized plan of care and coordination with other care providers 2. 24/7 contact phone numbers for assistance for urgent and routine care needs. 3. Service will only be billed when office clinical staff spend 20 minutes or more in a month to coordinate care. 4. Only one practitioner may furnish and bill the service in a calendar month. 5.The patient may stop CCM services at any time (effective at the end of the month) by phone call to the office staff. 6. The patient will be responsible for cost sharing (co-pay) of up to 20% of the service fee (after annual deductible is met). Patient agreed to services and consent obtained.  Patient agreed to services and verbal consent obtained.   Assessment: Review of patient past medical history, allergies, medications, health status, including review of consultants reports, laboratory and other test data, was performed as part of comprehensive evaluation and provision of chronic care management services.   SDOH (Social Determinants of Health) assessments and interventions performed:  SDOH Interventions    Flowsheet Row Most Recent Value  SDOH Interventions   Food Insecurity Interventions  Intervention Not Indicated  Financial Strain Interventions Intervention Not Indicated  Housing Interventions Intervention Not Indicated  Physical Activity Interventions Other (Comments)  [reviewed insurance fitness benefit]  Stress Interventions Intervention Not Indicated  Transportation Interventions Intervention Not Indicated        CCM Care Plan  Allergies  Allergen Reactions   Penicillins Other (See Comments)    From allergy testing, unsure if allergy exists    Outpatient Encounter Medications as of 10/29/2021  Medication Sig Note   atorvastatin (LIPITOR) 10 MG tablet Take 1 tablet (10 mg total) by mouth daily. TAKE 1 TABLET BY MOUTH DAILY AT 6PM (Patient taking differently: Take 10 mg by mouth 3 (three) times a week.)    Calcium Carb-Cholecalciferol (CALCIUM CARBONATE-VITAMIN D3 PO) Take 1 tablet by mouth daily. 1200 mg daily WITH 5000 IU D 06/27/2018: 1200 mg   cetirizine (ZYRTEC) 10 MG tablet TAKE 1 TABLET BY MOUTH DAILY    FLUAD QUADRIVALENT 0.5 ML injection     fluticasone (FLONASE) 50 MCG/ACT nasal spray INSTILL 2 SPRAYS INTO EACH NOSTRIL DAILY    glucosamine-chondroitin 500-400 MG tablet Take 1 tablet by mouth 3 (three) times daily.    zolpidem (AMBIEN) 10 MG tablet Take 1 tablet (10 mg total) by mouth at bedtime as needed for sleep.    No facility-administered encounter medications on file as of 10/29/2021.    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   Sleep apnea with use of continuous  positive airway pressure (CPAP) 07/26/2013   Screening for malignant neoplasm of the cervix 10/29/2011   Obesity (BMI 30-39.9) 07/07/2011   Osteopenia 07/07/2011   Routine general medical examination at a health care facility 07/07/2011     Conditions to be addressed/monitored:HLD and Vitamin D deficiency, OSA  Care Plan : RN Care Manager Plan of Care  Updates made by Dimitri Ped, RN since 10/29/2021 12:00 AM     Problem: Chronic Disease Management and Care Coordination Needs(HLD, OSA, Vitamin D deficiency)   Priority: High     Long-Range Goal: Establish Plan of Care for Chronic Disease Management Needs (HLD, OSA, Vitamin D deficiency)   Start Date: 10/29/2021  Expected End Date: 10/24/2022  Priority: High  Note:   Current Barriers:  Chronic Disease Management support and education needs related to HLD and OSA, Vitamin D deficiency   States that she tries to eat healthy and is mostly vegetarian.  States she wears her CPAP without any problems and usually feels rested in the morning.  States she has been trying to exercise more.  States she has a rowing machine and weighs that she uses   RNCM Clinical Goal(s):  Patient will verbalize understanding of plan for management of HLD and OSA, Vitamin D deficiency   as evidenced by voiced adherence to plan of care verbalize basic understanding of  HLD and OSA, Vitamin D deficiency   disease process and self health management plan as evidenced by voiced understanding of education take all medications exactly as prescribed and will call provider for medication related questions as evidenced by dispense report and pt verbalization attend all scheduled medical appointments: Dr. Birdie Riddle  as evidenced by review of medical record continue to work with RN Care Manager to address care management and care coordination needs related to  HLD and OSA, Vitamin D deficiency   as evidenced by adherence to CM Team Scheduled appointments through collaboration with RN Care manager, provider, and care team.   Interventions: 1:1 collaboration with primary care provider regarding development and update of comprehensive plan of care as evidenced by provider attestation and  co-signature Inter-disciplinary care team collaboration (see longitudinal plan of care) Evaluation of current treatment plan related to  self management and patient's adherence to plan as established by provider   Vitamin D deficiency   (Status:  New goal.)  Long Term Goal Evaluation of current treatment plan related to  Vitamin D deficiency ,  Vitamin D deficiency  self-management and patient's adherence to plan as established by provider. Discussed plans with patient for ongoing care management follow up and provided patient with direct contact information for care management team Evaluation of current treatment plan related to Vitamin D deficiency and patient's adherence to plan as established by provider Provided education to patient re: Vitamin D deficiency Discussed plans with patient for ongoing care management follow up and provided patient with direct contact information for care management team Assessed social determinant of health barriers  Hyperlipidemia Interventions:  (Status:  New goal.) Long Term Goal Medication review performed; medication list updated in electronic medical record.  Provider established cholesterol goals reviewed Counseled on importance of regular laboratory monitoring as prescribed Provided HLD educational materials Reviewed role and benefits of statin for ASCVD risk reduction Reviewed importance of limiting foods high in cholesterol Reviewed exercise goals and target of 150 minutes per week  Patient Goals/Self-Care Activities: Take all medications as prescribed Attend all scheduled provider appointments Call pharmacy for medication refills 3-7 days in advance  of running out of medications Attend church or other social activities Perform all self care activities independently  Perform IADL's (shopping, preparing meals, housekeeping, managing finances) independently Call provider office for new concerns or questions  call for medicine refill 2 or 3 days  before it runs out take all medications exactly as prescribed call doctor with any symptoms you believe are related to your medicine  Follow Up Plan:  Telephone follow up appointment with care management team member scheduled for:  05/06/22 The patient has been provided with contact information for the care management team and has been advised to call with any health related questions or concerns.       Plan:Telephone follow up appointment with care management team member scheduled for:  05/06/22 The patient has been provided with contact information for the care management team and has been advised to call with any health related questions or concerns.  Peter Garter RN, BSN,CCM, CDE Care Management Coordinator [place] [phone], Mobile [phone]

## 2021-10-29 NOTE — Patient Instructions (Signed)
Visit Information   Thank you for taking time to visit with me today. Please don't hesitate to contact me if I can be of assistance to you before our next scheduled telephone appointment.  Following are the goals we discussed today:  Take all medications as prescribed Attend all scheduled provider appointments Call pharmacy for medication refills 3-7 days in advance of running out of medications Attend church or other social activities Perform all self care activities independently  Perform IADL's (shopping, preparing meals, housekeeping, managing finances) independently Call provider office for new concerns or questions  call for medicine refill 2 or 3 days before it runs out take all medications exactly as prescribed call doctor with any symptoms you believe are related to your medicine Dyslipidemia Dyslipidemia is an imbalance of waxy, fat-like substances (lipids) in the blood. The body needs lipids in small amounts. Dyslipidemia often involves a high level of cholesterol or triglycerides, which are types of lipids. Common forms of dyslipidemia include: High levels of LDL cholesterol. LDL is the type of cholesterol that causes fatty deposits (plaques) to build up in the blood vessels that carry blood away from the heart (arteries). Low levels of HDL cholesterol. HDL cholesterol is the type of cholesterol that protects against heart disease. High levels of HDL remove the LDL buildup from arteries. High levels of triglycerides. Triglycerides are a fatty substance in the blood that is linked to a buildup of plaques in the arteries. What are the causes? There are two main types of dyslipidemia: primary and secondary. Primary dyslipidemia is caused by changes (mutations) in genes that are passed down through families (inherited). These mutations cause several types of dyslipidemia. Secondary dyslipidemia may be caused by various risk factors that can lead to the disease, such as lifestyle  choices and certain medical conditions. What increases the risk? You are more likely to develop this condition if you are an older man or if you are a woman who has gone through menopause. Other risk factors include: Having a family history of dyslipidemia. Taking certain medicines, including birth control pills, steroids, some diuretics, and beta-blockers. Eating a diet high in saturated fat. Smoking cigarettes or excessive alcohol intake. Having certain medical conditions such as diabetes, polycystic ovary syndrome (PCOS), kidney disease, liver disease, or hypothyroidism. Not exercising regularly. Being overweight or obese with too much belly fat. What are the signs or symptoms? In most cases, dyslipidemia does not usually cause any symptoms. In severe cases, very high lipid levels can cause: Fatty bumps under the skin (xanthomas). A white or gray ring around the black center (pupil) of the eye. Very high triglyceride levels can cause inflammation of the pancreas (pancreatitis). How is this diagnosed? Your health care provider may diagnose dyslipidemia based on a routine blood test (fasting blood test). Because most people do not have symptoms of the condition, this blood testing (lipid profile) is done on adults age 41 and older and is repeated every 4-6 years. This test checks: Total cholesterol. This measures the total amount of cholesterol in your blood, including LDL cholesterol, HDL cholesterol, and triglycerides. A healthy number is below 200 mg/dL (5.17 mmol/L). LDL cholesterol. The target number for LDL cholesterol is different for each person, depending on individual risk factors. A healthy number is usually below 100 mg/dL (2.59 mmol/L). Ask your health care provider what your LDL cholesterol should be. HDL cholesterol. An HDL level of 60 mg/dL (1.55 mmol/L) or higher is best because it helps to protect against heart disease. A  number below 40 mg/dL (1.03 mmol/L) for men or below 50  mg/dL (1.29 mmol/L) for women increases the risk for heart disease. Triglycerides. A healthy triglyceride number is below 150 mg/dL (1.69 mmol/L). If your lipid profile is abnormal, your health care provider may do other blood tests. How is this treated? Treatment depends on the type of dyslipidemia that you have and your other risk factors for heart disease and stroke. Your health care provider will have a target range for your lipid levels based on this information. Treatment for dyslipidemia starts with lifestyle changes, such as diet and exercise. Your health care provider may recommend that you: Get regular exercise. Make changes to your diet. Quit smoking if you smoke. Limit your alcohol intake. If diet changes and exercise do not help you reach your goals, your health care provider may also prescribe medicine to lower lipids. The most commonly prescribed type of medicine lowers your LDL cholesterol (statin drug). If you have a high triglyceride level, your provider may prescribe another type of drug (fibrate) or an omega-3 fish oil supplement, or both. Follow these instructions at home: Eating and drinking  Follow instructions from your health care provider or dietitian about eating or drinking restrictions. Eat a healthy diet as told by your health care provider. This can help you reach and maintain a healthy weight, lower your LDL cholesterol, and raise your HDL cholesterol. This may include: Limiting your calories, if you are overweight. Eating more fruits, vegetables, whole grains, fish, and lean meats. Limiting saturated fat, trans fat, and cholesterol. Do not drink alcohol if: Your health care provider tells you not to drink. You are pregnant, may be pregnant, or are planning to become pregnant. If you drink alcohol: Limit how much you have to: 0-1 drink a day for women. 0-2 drinks a day for men. Know how much alcohol is in your drink. In the [place] one drink equals one 12 oz  bottle of beer (355 mL), one 5 oz glass of wine (148 mL), or one 1 oz glass of hard liquor (44 mL). Activity Get regular exercise. Start an exercise and strength training program as told by your health care provider. Ask your health care provider what activities are safe for you. Your health care provider may recommend: 30 minutes of aerobic activity 4-6 days a week. Brisk walking is an example of aerobic activity. Strength training 2 days a week. General instructions Do not use any products that contain nicotine or tobacco. These products include cigarettes, chewing tobacco, and vaping devices, such as e-cigarettes. If you need help quitting, ask your health care provider. Take over-the-counter and prescription medicines only as told by your health care provider. This includes supplements. Keep all follow-up visits. This is important. Contact a health care provider if: You are having trouble sticking to your exercise or diet plan. You are struggling to quit smoking or to control your use of alcohol. Summary Dyslipidemia often involves a high level of cholesterol or triglycerides, which are types of lipids. Treatment depends on the type of dyslipidemia that you have and your other risk factors for heart disease and stroke. Treatment for dyslipidemia starts with lifestyle changes, such as diet and exercise. Your health care provider may prescribe medicine to lower lipids. This information is not intended to replace advice given to you by your health care provider. Make sure you discuss any questions you have with your health care provider. Document Revised: 01/20/2021 Document Reviewed: 01/20/2021 Elsevier Patient Education  2022 Elsevier  [facility] next appointment is by telephone on 05/06/22 at 10 AM  Please call the care guide team at [phone] if you need to cancel or reschedule your appointment.  Vitamin D Deficiency Vitamin D deficiency is when your body does not have enough vitamin  D. Vitamin D is important to your body because: It helps your body use other minerals. It helps to keep your bones strong and healthy. It may help to prevent some diseases. It helps your heart and other muscles work well. Not getting enough vitamin D can make your bones soft. It can also cause other health problems. What are the causes? This condition may be caused by: Not eating enough foods that contain vitamin D. Not getting enough sun. Having diseases that make it hard for your body to absorb vitamin D. Having a surgery in which a part of the stomach or a part of the small intestine is removed. Having kidney disease or liver disease. What increases the risk? You are more likely to get this condition if: You are older. You do not spend much time outdoors. You live in a nursing home. You have had broken bones. You have weak or thin bones (osteoporosis). You have a disease or condition that changes how your body absorbs vitamin D. You have dark skin. You take certain medicines. You are overweight or obese. What are the signs or symptoms? In mild cases, there may not be any symptoms. If the condition is very bad, symptoms may include: Bone pain. Muscle pain. Falling often. Broken bones caused by a minor injury. How is this treated? Treatment may include taking supplements as told by your doctor. Your doctor will tell you what dose is best for you. Supplements may include: Vitamin D. Calcium. Follow these instructions at home: Eating and drinking  Eat foods that contain vitamin D, such as: Dairy products, cereals, or juices with added vitamin D. Check the label. Fish, such as salmon or trout. Eggs. Oysters. Mushrooms. The items listed above may not be a complete list of what you can eat and drink. Contact a dietitian for more options. General instructions Take medicines and supplements only as told by your doctor. Get regular, safe exposure to natural sunlight. Do not  use a tanning bed. Maintain a healthy weight. Lose weight if needed. Keep all follow-up visits as told by your doctor. This is important. How is this prevented? You can get vitamin D by: Eating foods that naturally contain vitamin D. Eating or drinking products that have vitamin D added to them, such as cereals, juices, and milk. Taking vitamin D or a multivitamin that contains vitamin D. Being in the sun. Your body makes vitamin D when your skin is exposed to sunlight. Your body changes the sunlight into a form of the vitamin that it can use. Contact a doctor if: Your symptoms do not go away. You feel sick to your stomach (nauseous). You throw up (vomit). You poop less often than normal, or you have trouble pooping (constipation). Summary Vitamin D deficiency is when your body does not have enough vitamin D. Vitamin D helps to keep your bones strong and healthy. This condition is often treated by taking a supplement. Your doctor will tell you what dose is best for you. This information is not intended to replace advice given to you by your health care provider. Make sure you discuss any questions you have with your health care provider. Document Revised: 07/25/2018 Document Reviewed: 07/25/2018 Elsevier Patient Education  [facility].  If you are experiencing a Mental Health or [place] or need someone to talk to, please call the Suicide and Crisis Lifeline: 988 call 1-800-273-TALK (toll free, 24 hour hotline) go to [facility] [address], [place] [phone]) call 911   Following is a copy of your full care plan:  Care Plan : RN Care Manager Plan of Care  Updates made by Dimitri Ped, RN since 10/29/2021 12:00 AM     Problem: Chronic Disease Management and Care Coordination Needs(HLD, OSA, Vitamin D deficiency)   Priority: High     Long-Range Goal: Establish Plan of Care for Chronic Disease Management  Needs (HLD, OSA, Vitamin D deficiency)   Start Date: 10/29/2021  Expected End Date: 10/24/2022  Priority: High  Note:   Current Barriers:  Chronic Disease Management support and education needs related to HLD and OSA, Vitamin D deficiency   States that she tries to eat healthy and is mostly vegetarian.  States she wears her CPAP without any problems and usually feels rested in the morning.  States she has been trying to exercise more.  States she has a rowing machine and weighs that she uses   RNCM Clinical Goal(s):  Patient will verbalize understanding of plan for management of HLD and OSA, Vitamin D deficiency   as evidenced by voiced adherence to plan of care verbalize basic understanding of  HLD and OSA, Vitamin D deficiency   disease process and self health management plan as evidenced by voiced understanding of education take all medications exactly as prescribed and will call provider for medication related questions as evidenced by dispense report and pt verbalization attend all scheduled medical appointments: Dr. Birdie Riddle  as evidenced by review of medical record continue to work with RN Care Manager to address care management and care coordination needs related to  HLD and OSA, Vitamin D deficiency   as evidenced by adherence to CM Team Scheduled appointments through collaboration with RN Care manager, provider, and care team.   Interventions: 1:1 collaboration with primary care provider regarding development and update of comprehensive plan of care as evidenced by provider attestation and co-signature Inter-disciplinary care team collaboration (see longitudinal plan of care) Evaluation of current treatment plan related to  self management and patient's adherence to plan as established by provider   Vitamin D deficiency   (Status:  New goal.)  Long Term Goal Evaluation of current treatment plan related to  Vitamin D deficiency ,  Vitamin D deficiency  self-management and patient's  adherence to plan as established by provider. Discussed plans with patient for ongoing care management follow up and provided patient with direct contact information for care management team Evaluation of current treatment plan related to Vitamin D deficiency and patient's adherence to plan as established by provider Provided education to patient re: Vitamin D deficiency Discussed plans with patient for ongoing care management follow up and provided patient with direct contact information for care management team Assessed social determinant of health barriers  Hyperlipidemia Interventions:  (Status:  New goal.) Long Term Goal Medication review performed; medication list updated in electronic medical record.  Provider established cholesterol goals reviewed Counseled on importance of regular laboratory monitoring as prescribed Provided HLD educational materials Reviewed role and benefits of statin for ASCVD risk reduction Reviewed importance of limiting foods high in cholesterol Reviewed exercise goals and target of 150 minutes per week  Patient Goals/Self-Care Activities: Take all medications as prescribed Attend  all scheduled provider appointments Call pharmacy for medication refills 3-7 days in advance of running out of medications Attend church or other social activities Perform all self care activities independently  Perform IADL's (shopping, preparing meals, housekeeping, managing finances) independently Call provider office for new concerns or questions  call for medicine refill 2 or 3 days before it runs out take all medications exactly as prescribed call doctor with any symptoms you believe are related to your medicine  Follow Up Plan:  Telephone follow up appointment with care management team member scheduled for:  05/06/22 The patient has been provided with contact information for the care management team and has been advised to call with any health related questions or concerns.        Consent to CCM Services: Ms. Califf was given information about Chronic Care Management services including:  CCM service includes personalized support from designated clinical staff supervised by her physician, including individualized plan of care and coordination with other care providers 24/7 contact phone numbers for assistance for urgent and routine care needs. Service will only be billed when office clinical staff spend 20 minutes or more in a month to coordinate care. Only one practitioner may furnish and bill the service in a calendar month. The patient may stop CCM services at any time (effective at the end of the month) by phone call to the office staff. The patient will be responsible for cost sharing (co-pay) of up to 20% of the service fee (after annual deductible is met).  Patient agreed to services and verbal consent obtained.   Patient verbalizes understanding of instructions provided today and agrees to view in [place].   Telephone follow up appointment with care management team member scheduled for: The patient has been provided with contact information for the care management team and has been advised to call with any health related questions or concerns.  Peter Garter RN, BSN,CCM, CDE Care Management Coordinator [place] [phone], Mobile [phone]

## 2021-11-05 ENCOUNTER — Ambulatory Visit (HOSPITAL_COMMUNITY)
Admission: RE | Admit: 2021-11-05 | Discharge: 2021-11-05 | Disposition: A | Discharge status: Home / Self Care | Payer: PPO | Source: Ambulatory Visit | Attending: Family Medicine | Admitting: Family Medicine

## 2021-11-05 ENCOUNTER — Encounter (HOSPITAL_COMMUNITY): Payer: Self-pay

## 2021-11-05 ENCOUNTER — Other Ambulatory Visit: Payer: Self-pay

## 2021-11-05 VITALS — BP 122/87 | HR 84 | Temp 98.5°F | Resp 18

## 2021-11-05 DIAGNOSIS — J069 Acute upper respiratory infection, unspecified: Secondary | ICD-10-CM

## 2021-11-05 NOTE — ED Provider Notes (Signed)
[facility]    CSN: [id number] Arrival date & time: 11/05/21  1536      History   Chief Complaint Chief Complaint  Patient presents with   Cough    Appt   Sore Throat   Nasal Congestion    HPI [facility] Christians Laura Stuart is a 75 y.o. female.   HPI  Patient presents today with URI symptoms of sore throat, nasal congestion and cough x3 to 4 days.  She reports her most worrisome symptom is a sore throat.  She has been without fever, body aches or chills.  Sick contacts. Throat pain is worse at nighttime.  She is taking over-the-counter antihistamine and use Flonase without relief.  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   Sleep apnea with use of continuous positive airway pressure (CPAP) 07/26/2013   Screening for malignant neoplasm of the cervix 10/29/2011   Obesity (BMI 30-39.9) 07/07/2011   Osteopenia 07/07/2011   Routine general medical examination at a health care facility 07/07/2011    Past Surgical History:  Procedure Laterality Date   COLONOSCOPY     FOOT SURGERY Left 05/07/2017   POLYPECTOMY     ROOT CANAL  06/2013   toes right foot Bilateral 2010   STRAIGHTENED BOTH FEET    TONSILLECTOMY AND ADENOIDECTOMY  1950   torn miniscus  2003   X2   UPPER GASTROINTESTINAL ENDOSCOPY     VAGINAL DELIVERY     X2    OB History   No obstetric history on file.      Home Medications    Prior to Admission medications    Medication Sig Start Date End Date Taking? Authorizing Provider  atorvastatin (LIPITOR) 10 MG tablet Take 1 tablet (10 mg total) by mouth daily. TAKE 1 TABLET BY MOUTH DAILY AT 6PM Patient taking differently: Take 10 mg by mouth 3 (three) times a week. 07/03/21 10/08/21  Midge Minium, MD  Calcium Carb-Cholecalciferol (CALCIUM CARBONATE-VITAMIN D3 PO) Take 1 tablet by mouth daily. 1200 mg daily WITH 5000 IU D    [provider]  cetirizine (ZYRTEC) 10 MG tablet TAKE 1 TABLET BY MOUTH DAILY 04/14/21   Midge Minium, MD  FLUAD QUADRIVALENT 0.5 ML injection  09/11/21   [provider]  fluticasone (FLONASE) 50 MCG/ACT nasal spray INSTILL 2 SPRAYS INTO EACH NOSTRIL DAILY 09/10/21   Midge Minium, MD  glucosamine-chondroitin 500-400 MG tablet Take 1 tablet by mouth 3 (three) times daily.    [provider]  zolpidem (AMBIEN) 10 MG tablet Take 1 tablet (10 mg total) by mouth at bedtime as needed for sleep. 10/08/21   Midge Minium, MD    Family History Family History  Problem Relation Age of Onset   Stroke Mother    Brain cancer Mother    Lung cancer Mother    Heart attack Mother    Emphysema Father  Alzheimer's disease Sister    Dementia Other        evident after a fall with a hip fracture   Colon cancer Neg Hx    Colon polyps Neg Hx    Esophageal cancer Neg Hx    Rectal cancer Neg Hx    Stomach cancer Neg Hx     Social History Social History   Tobacco Use   Smoking status: Never   Smokeless tobacco: Never  Vaping Use   Vaping Use: Never used  Substance Use Topics   Alcohol use: Yes    Alcohol/week: 0.0 standard drinks    Comment: 1 glass every 2 months; socially   Drug use: No     Allergies   Penicillins   Review of Systems Review of Systems Pertinent negatives listed in HPI   Physical Exam Triage Vital Signs ED Triage Vitals  Enc Vitals Group     BP 11/05/21 1601 122/87     Pulse Rate 11/05/21 1601 84     Resp  11/05/21 1601 18     Temp 11/05/21 1601 98.5 F (36.9 C)     Temp Source 11/05/21 1601 Oral     SpO2 11/05/21 1601 97 %     Weight --      Height --      Head Circumference --      Peak Flow --      Pain Score 11/05/21 1559 4     Pain Loc --      Pain Edu? --      Excl. in [place]? --    No data found.  Updated Vital Signs BP 122/87 (BP Location: Right Arm)   Pulse 84   Temp 98.5 F (36.9 C) (Oral)   Resp 18   SpO2 97%   Visual Acuity Right Eye Distance:   Left Eye Distance:   Bilateral Distance:    Right Eye Near:   Left Eye Near:    Bilateral Near:     Physical Exam  General Appearance:    Alert, cooperative, no distress  HENT:   Normocephalic, ears normal, nares mucosal edema with congestion, rhinorrhea, oropharynx patent with erythema or exudate   Eyes:    PERRL, conjunctiva/corneas clear, EOM's intact       Lungs:     Clear to auscultation bilaterally, respirations unlabored  Heart:    Regular rate and rhythm  Neurologic:   Awake, alert, oriented x 3. No apparent focal neurological           defect.      UC Treatments / Results  Labs (all labs ordered are listed, but only abnormal results are displayed) Labs Reviewed - No data to display  EKG   Radiology No results found.  Procedures Procedures (including critical care time)  Medications Ordered in UC Medications - No data to display  Initial Impression / Assessment and Plan / UC Course  I have reviewed the triage vital signs and the nursing notes.  Pertinent labs & imaging results that were available during my care of the patient were reviewed by me and considered in my medical decision making (see chart for details).    Viral upper respiratory illness Symptom management warranted only. Manage fever with Tylenol and ibuprofen.   Nasal symptoms with over-the-counter antihistamines recommended.   Treatment per discharge medications/discharge instructions.   Red flags/ER precautions given.  The  most current CDC isolation/quarantine recommendation advised.   Final Clinical Impressions(s) / UC Diagnoses  Final diagnoses:  Viral upper respiratory illness     Discharge Instructions      Your symptoms are viral and symptoms management is warranted only.  For nasal symptoms, continue Flonase.  You may also benefit from otc nasal decongestant and antihistamine such as Claritin D or cetrizine D.   For sore If you have a sore or scratchy throat, use a saltwater gargle-  to  teaspoon of salt dissolved in a 4-ounce to 8-ounce glass of warm water.  Gargle the solution for approximately 15-30 seconds and then spit.  It is important not to swallow the solution.  You can also use throat lozenges/cough drops and Chloraseptic spray to help with throat pain or discomfort.  Warm or cold liquids can also be helpful in relieving throat pain.      ED Prescriptions   None    PDMP not reviewed this encounter.   Scot Jun, FNP 11/05/21 [phone]

## 2021-11-05 NOTE — ED Triage Notes (Signed)
Pt presents with c/o cough, sore throat and nasal congestion X 4 days.

## 2021-11-05 NOTE — Discharge Instructions (Addendum)
Your symptoms are viral and symptoms management is warranted only.  For nasal symptoms, continue Flonase.  You may also benefit from otc nasal decongestant and antihistamine such as Claritin D or cetrizine D.   For sore If you have a sore or scratchy throat, use a saltwater gargle-  to  teaspoon of salt dissolved in a 4-ounce to 8-ounce glass of warm water.  Gargle the solution for approximately 15-30 seconds and then spit.  It is important not to swallow the solution.  You can also use throat lozenges/cough drops and Chloraseptic spray to help with throat pain or discomfort.  Warm or cold liquids can also be helpful in relieving throat pain.

## 2021-11-06 ENCOUNTER — Encounter: Payer: Self-pay | Admitting: Family Medicine

## 2021-11-07 ENCOUNTER — Encounter: Payer: Self-pay | Admitting: Family Medicine

## 2021-11-12 DIAGNOSIS — G4733 Obstructive sleep apnea (adult) (pediatric): Secondary | ICD-10-CM | POA: Diagnosis not present

## 2021-12-13 DIAGNOSIS — G4733 Obstructive sleep apnea (adult) (pediatric): Secondary | ICD-10-CM | POA: Diagnosis not present

## 2021-12-17 DIAGNOSIS — G4733 Obstructive sleep apnea (adult) (pediatric): Secondary | ICD-10-CM | POA: Diagnosis not present

## 2022-01-13 DIAGNOSIS — G4733 Obstructive sleep apnea (adult) (pediatric): Secondary | ICD-10-CM | POA: Diagnosis not present

## 2022-01-17 DIAGNOSIS — G4733 Obstructive sleep apnea (adult) (pediatric): Secondary | ICD-10-CM | POA: Diagnosis not present

## 2022-01-27 DIAGNOSIS — H2513 Age-related nuclear cataract, bilateral: Secondary | ICD-10-CM | POA: Diagnosis not present

## 2022-01-27 DIAGNOSIS — H524 Presbyopia: Secondary | ICD-10-CM | POA: Diagnosis not present

## 2022-02-10 DIAGNOSIS — G4733 Obstructive sleep apnea (adult) (pediatric): Secondary | ICD-10-CM | POA: Diagnosis not present

## 2022-02-14 DIAGNOSIS — G4733 Obstructive sleep apnea (adult) (pediatric): Secondary | ICD-10-CM | POA: Diagnosis not present

## 2022-03-02 ENCOUNTER — Telehealth (INDEPENDENT_AMBULATORY_CARE_PROVIDER_SITE_OTHER): Payer: Medicare HMO | Admitting: Registered Nurse

## 2022-03-02 DIAGNOSIS — J22 Unspecified acute lower respiratory infection: Secondary | ICD-10-CM

## 2022-03-02 MED ORDER — PREDNISONE 20 MG PO TABS: 20.0000 mg | ORAL_TABLET | Freq: Every day | ORAL | 0 refills | Status: DC

## 2022-03-02 MED ORDER — DOXYCYCLINE HYCLATE 100 MG PO TABS: 100.0000 mg | ORAL_TABLET | Freq: Two times a day (BID) | ORAL | 0 refills | Status: DC

## 2022-03-02 NOTE — Progress Notes (Signed)
? ? ?Telemedicine Encounter- SOAP NOTE Established Patient ? ?This telephone encounter was conducted with the patient's (or proxy's) verbal consent via audio telecommunications: yes/no: Yes ?Patient was instructed to have this encounter in a suitably private space; and to only have persons present to whom they give permission to participate. In addition, patient identity was confirmed by use of name plus two identifiers (DOB and address).  I discussed the limitations, risks, security and privacy concerns of performing an evaluation and management service by telephone and the availability of in person appointments. I also discussed with the patient that there may be a patient responsible charge related to this service. The patient expressed understanding and agreed to proceed. ? ?I spent a total of 15 minutes talking with the patient or their proxy. ? ?Patient at home ?Provider in office ? ?Participants: Kathrin Ruddy, NP and Mary-Elisabeth Nygard ? ?No chief complaint on file. ? ? ?Subjective  ? ?Laura Stuart is a 76 y.o. established patient. Telephone visit today for ? ?HPI ?On the 22nd/23rd, was on a road trip - around others. ?On 02/21/22 woke up with sinus pressure, congestion.  ?On 02/25/22 tested positive for COVID. ?Up to date on vaccinations. ?Took OTC sinus relief, good effect.  ?Cough persisted. By mid/end of week felt like cough migrated to lungs and had chest congestion and productive cough ?Has been taking mucinex. Some effect.  ?Has had some cough worse at night. CPAP helps. ?Some shallow breathing - mostly to avoid coughing.  ? ?Patient Active Problem List  ? Diagnosis Date Noted  ? OSA on CPAP 09/26/2020  ? Monocular diplopia 04/10/2019  ? Rhinitis 02/02/2019  ? Hyperlipidemia 08/24/2017  ? Vitamin D deficiency 08/24/2017  ? TIA (transient ischemic attack) 02/26/2017  ? 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 Sleep apnea with use of continuous positive airway pressure (CPAP) 07/26/2013  ?  Screening for malignant neoplasm of the cervix 10/29/2011  ? Obesity (BMI 30-39.9) 07/07/2011  ? Osteopenia 07/07/2011  ? Routine general medical examination at a health care facility 07/07/2011  ? ? ?Past Medical History:  ?Diagnosis Date  ? Allergy   ? Atypical facial pain   ? Hyperlipidemia 08/24/2017  ? Neuralgia   ? Trigeminal  ? Sleep apnea   ? using cpap,OSA  ? Trigeminal neuralgia   ? PAST HX- HAD CRANIOTOMY 08-14-2019 FIXED THIS   ? ? ?Current Outpatient Medications  ?Medication Sig Dispense Refill  ? doxycycline (VIBRA-TABS) 100 MG tablet Take 1 tablet (100 mg total) by mouth 2 (two) times daily. 20 tablet 0  ? predniSONE (DELTASONE) 20 MG tablet Take 1 tablet (20 mg total) by mouth daily with breakfast. 5 tablet 0  ? atorvastatin (LIPITOR) 10 MG tablet Take 1 tablet (10 mg total) by mouth daily. TAKE 1 TABLET BY MOUTH DAILY AT 6PM (Patient taking differently: Take 10 mg by mouth 3 (three) times a week.) 90 tablet 1  ? Calcium Carb-Cholecalciferol (CALCIUM CARBONATE-VITAMIN D3 PO) Take 1 tablet by mouth daily. 1200 mg daily WITH 5000 IU D    ? cetirizine (ZYRTEC) 10 MG tablet TAKE 1 TABLET BY MOUTH DAILY 30 tablet 6  ? FLUAD QUADRIVALENT 0.5 ML injection     ? fluticasone (FLONASE) 50 MCG/ACT nasal spray INSTILL 2 SPRAYS INTO EACH NOSTRIL DAILY 16 g 6  ? glucosamine-chondroitin 500-400 MG tablet Take 1 tablet by mouth 3 (three) times daily.    ? zolpidem (AMBIEN) 10 MG tablet Take 1 tablet (10 mg total) by mouth at bedtime as  needed for sleep. 30 tablet 5  ? ?No current facility-administered medications for this visit.  ? ? ?Allergies  ?Allergen Reactions  ? Penicillins Other (See Comments)  ?  From allergy testing, unsure if allergy exists  ? ? ?Social History  ? ?Socioeconomic History  ? Marital status: Married  ?  Spouse name: [name]  ? Number of children: 2  ? Years of education: Grad sch.  ? Highest education level: Not on file  ?Occupational History  ?  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 Smoking status: Never  ? Smokeless tobacco: Never  ?Vaping Use  ? Vaping Use: Never used  ?Substance and Sexual Activity  ? Alcohol use: Yes  ?  Alcohol/week: 0.0 standard drinks  ?  Comment: 1 glass every 2 months; socially  ? Drug use: No  ? Sexual activity: Not on file  ?Other Topics Concern  ? Not on file  ?Social History Narrative  ? Patient is married [name]) and lives at home with her husband.  ? Patient has two adult children.  ? Patient is retired.  ? Patient has a Production designer, theatre/television/film.  ? Patient is right-handed.  ? Patient drinks 5 cups of caffeine/week, at home drinks decaf coffee.  ? ?Social Determinants of Health  ? ?Financial Resource Strain: Low Risk   ? Difficulty of Paying Living Expenses: Not hard at all  ?Food Insecurity: No Food Insecurity  ? Worried About Charity fundraiser in the Last Year: Never true  ? Ran Out of Food in the Last Year: Never true  ?Transportation Needs: No Transportation Needs  ? Lack of Transportation (Medical): No  ? Lack of Transportation (Non-Medical): No  ?Physical Activity: Insufficiently Active  ? Days of Exercise per Week: 4 days  ? Minutes of Exercise per Session: 30 min  ?Stress: No Stress Concern Present  ? Feeling of Stress : Not at all  ?Social Connections: Socially Integrated  ? Frequency of Communication with Friends and Family: More than three times a week  ? Frequency of Social Gatherings with Friends and Family: More than three times a week  ? Attends Religious Services: More than 4 times per year  ? Active Member of Clubs or Organizations: Yes  ? Attends Archivist Meetings: 1 to 4 times per year  ? Marital Status: Married  ?Intimate Partner Violence: Not At Risk  ? Fear of Current or Ex-Partner: No  ? Emotionally Abused: No  ? Physically Abused: No  ? Sexually Abused: No  ? ? ?ROS ?Per hpi  ? ?Objective  ? ?Vitals as reported by the patient: ?There were no vitals  filed for this visit. ? ?Diagnoses and all orders for this visit: ? ?Lower respiratory infection ?-     doxycycline (VIBRA-TABS) 100 MG tablet; Take 1 tablet (100 mg total) by mouth 2 (two) times daily. ?-     predniSONE (DELTASONE) 20 MG tablet; Take 1 tablet (20 mg total) by mouth daily with breakfast. ? ? ? ?PLAN ?Doxycycline and prednisone as above ?Continue mucinex ?Return if worsening or failing to improve ?Patient encouraged to call clinic with any questions, comments, or concerns. ? ?I discussed the assessment and treatment plan with the patient. The patient was provided an opportunity to ask questions and all were answered. The patient agreed with the plan and demonstrated an understanding of the instructions. ?  ?The patient was advised to call  back or seek an in-person evaluation if the symptoms worsen or if the condition fails to improve as anticipated. ? ?I provided 15 minutes of non-face-to-face time during this encounter. ? ?Maximiano Coss, NP ? ?

## 2022-03-05 ENCOUNTER — Ambulatory Visit: Payer: Medicare HMO | Admitting: Podiatry

## 2022-03-13 DIAGNOSIS — G4733 Obstructive sleep apnea (adult) (pediatric): Secondary | ICD-10-CM | POA: Diagnosis not present

## 2022-04-12 DIAGNOSIS — G4733 Obstructive sleep apnea (adult) (pediatric): Secondary | ICD-10-CM | POA: Diagnosis not present

## 2022-04-20 ENCOUNTER — Other Ambulatory Visit: Payer: Self-pay | Admitting: Family Medicine

## 2022-04-20 NOTE — Telephone Encounter (Signed)
LOV: 03/02/22 Last Refill:10/08/21 Upcoming appt: 05/06/22 Zolpidem 10 mg

## 2022-04-20 NOTE — Telephone Encounter (Signed)
Patient is requesting a refill of the following medications: Requested Prescriptions   Pending Prescriptions Disp Refills   zolpidem (AMBIEN) 10 MG tablet [Pharmacy Med Name: ZOLPIDEM TARTRATE 10 MG TAB] 30 tablet     Sig: TAKE 1 TABLET BY MOUTH EACH NIGHT AT BEDTIME AS NEEDED FOR SLEEP    Date of patient request: 04/19/2022 Last office visit:  Date of last refill:  Last refill amount:  Follow up time period per chart:

## 2022-05-06 ENCOUNTER — Telehealth: Payer: PPO

## 2022-05-13 DIAGNOSIS — G4733 Obstructive sleep apnea (adult) (pediatric): Secondary | ICD-10-CM | POA: Diagnosis not present

## 2022-05-20 ENCOUNTER — Ambulatory Visit: Payer: Self-pay

## 2022-05-20 DIAGNOSIS — E785 Hyperlipidemia, unspecified: Secondary | ICD-10-CM

## 2022-05-20 NOTE — Chronic Care Management (AMB) (Signed)
  Care Management   Follow Up Note   05/20/2022 Name: Laura Stuart MRN: [id number] DOB: 1946-02-11   Referred by: Midge Minium, MD Reason for referral : Chronic Care Management (Case closed unable to maintain contact)   Case closed unable to maintain contact  Follow Up Plan: No further follow up required: Case closed unable to maintain contact  [place], [facility], CDE Care Management Coordinator [name] [phone]

## 2022-05-20 NOTE — Patient Instructions (Signed)
Visit Information  Thank you for allowing me to share the care management and care coordination services that are available to you as part of your health plan and services through your primary care provider and medical home. Please reach out to me at [phone] if the care management/care coordination team may be of assistance to you in the future.   Aliayah Tyer RN, BSN,CCM, CDE Care Management Coordinator [facility] [phone]   

## 2022-06-12 DIAGNOSIS — G4733 Obstructive sleep apnea (adult) (pediatric): Secondary | ICD-10-CM | POA: Diagnosis not present

## 2022-06-17 DIAGNOSIS — H5030 Unspecified intermittent heterotropia: Secondary | ICD-10-CM | POA: Diagnosis not present

## 2022-06-17 DIAGNOSIS — H5021 Vertical strabismus, right eye: Secondary | ICD-10-CM | POA: Diagnosis not present

## 2022-06-17 DIAGNOSIS — H5509 Other forms of nystagmus: Secondary | ICD-10-CM | POA: Diagnosis not present

## 2022-06-17 DIAGNOSIS — G4733 Obstructive sleep apnea (adult) (pediatric): Secondary | ICD-10-CM | POA: Diagnosis not present

## 2022-06-17 DIAGNOSIS — H5111 Convergence insufficiency: Secondary | ICD-10-CM | POA: Diagnosis not present

## 2022-06-17 DIAGNOSIS — G500 Trigeminal neuralgia: Secondary | ICD-10-CM | POA: Diagnosis not present

## 2022-07-13 DIAGNOSIS — G4733 Obstructive sleep apnea (adult) (pediatric): Secondary | ICD-10-CM | POA: Diagnosis not present

## 2022-07-29 ENCOUNTER — Encounter: Payer: Self-pay | Admitting: Neurology

## 2022-07-29 ENCOUNTER — Ambulatory Visit: Payer: Medicare HMO | Admitting: Neurology

## 2022-07-29 ENCOUNTER — Telehealth: Payer: Self-pay | Admitting: Neurology

## 2022-07-29 VITALS — BP 126/64 | HR 80 | Ht 65.0 in | Wt 179.0 lb

## 2022-07-29 DIAGNOSIS — H532 Diplopia: Secondary | ICD-10-CM | POA: Diagnosis not present

## 2022-07-29 DIAGNOSIS — T421X5A Adverse effect of iminostilbenes, initial encounter: Secondary | ICD-10-CM | POA: Diagnosis not present

## 2022-07-29 DIAGNOSIS — H5509 Other forms of nystagmus: Secondary | ICD-10-CM

## 2022-07-29 DIAGNOSIS — R42 Dizziness and giddiness: Secondary | ICD-10-CM | POA: Diagnosis not present

## 2022-07-29 NOTE — Progress Notes (Signed)
PATIENT: Laura Stuart DOB: [birth year]  REASON FOR VISIT: follow up HISTORY FROM: patient HISTORY OF PRESENT ILLNESS: Today 76/30/23:   07-29-2022: RV for [name] but with a completely new problem: downbeat nystagmus.  I have I have followed never I have followed Laura Stuart as a sleep apnea patient and the patient was trigeminal neuralgia for over a decade or for over a decade, and she has been a compliant CPAP user.  I briefly want to "the download of her recent CPAP use over 30 days she has used the machine 100% of days and 100% over 4 consecutive hours with an average use at time of 8 to 8 hours 3 minutes.  This.  She is using an AutoSet 11 with between 5 and 15 cm centimeter water with 2 cm expiratory pressure relief 3 pressure relief.  Residual AHI is 0.9 95th percentile pressure is 8.3 0.3 cm water.  Based on this we have no worries about sleep apnea sleep apnea today.  Her adverse sleepiness score was endorsed at 0/ 24 points;  her fatigue severity score was endorsed at 9/ 63  her geriatric her geriatric depression scale  was endorsed at 0/ 15 .  The ophthalmology records state a newly discovered downbeat Nystagmus, and the need for a brain MRI with and without contrast,  she reports diplopia, unable to merge the images. Skewed diplopia even with prism glasses.      09-02-2021: MM, NP;      REVIEW OF SYSTEMS: Out of a complete 14 system review of symptoms, the patient complains only of the following symptoms, and all other reviewed systems are negative.   ESS 1  ALLERGIES: Allergies  Allergen Reactions   Penicillins Other (See Comments)    From allergy testing, unsure if allergy exists    HOME MEDICATIONS: Outpatient Medications Prior to Visit  Medication Sig Dispense Refill   atorvastatin (LIPITOR) 10 MG tablet Take 10 mg by mouth 2 (two) times a week.     Calcium Carb-Cholecalciferol (CALCIUM CARBONATE-VITAMIN D3 PO) Take 1 tablet by mouth daily. 1200 mg  daily WITH 5000 IU D     cetirizine (ZYRTEC) 10 MG tablet TAKE 1 TABLET BY MOUTH DAILY (Patient taking differently: Take 10 mg by mouth daily as needed for allergies.) 30 tablet 6   fluticasone (FLONASE) 50 MCG/ACT nasal spray INSTILL 2 SPRAYS INTO EACH NOSTRIL DAILY (Patient taking differently: Place 2 sprays into both nostrils daily as needed for allergies or rhinitis.) 16 g 6   glucosamine-chondroitin 500-400 MG tablet Take 1 tablet by mouth 3 (three) times daily.     atorvastatin (LIPITOR) 10 MG tablet Take 1 tablet (10 mg total) by mouth daily. TAKE 1 TABLET BY MOUTH DAILY AT 6PM (Patient taking differently: Take 10 mg by mouth 2 (two) times a week.) 90 tablet 1   doxycycline (VIBRA-TABS) 100 MG tablet Take 1 tablet (100 mg total) by mouth 2 (two) times daily. 20 tablet 0   FLUAD QUADRIVALENT 0.5 ML injection      predniSONE (DELTASONE) 20 MG tablet Take 1 tablet (20 mg total) by mouth daily with breakfast. 5 tablet 0   zolpidem (AMBIEN) 10 MG tablet TAKE 1 TABLET BY MOUTH EACH NIGHT AT BEDTIME AS NEEDED FOR SLEEP 30 tablet 3   No facility-administered medications prior to visit.    PAST MEDICAL HISTORY: Past Medical History:  Diagnosis Date   Allergy    Atypical facial pain    Hyperlipidemia 08/24/2017   Neuralgia  Trigeminal   Sleep apnea    using cpap,OSA   Trigeminal neuralgia    PAST HX- HAD CRANIOTOMY 08-14-2019 FIXED THIS     PAST SURGICAL HISTORY: Past Surgical History:  Procedure Laterality Date   COLONOSCOPY     FOOT SURGERY Left 05/07/2017   POLYPECTOMY     ROOT CANAL  06/2013   toes right foot Bilateral 2010   STRAIGHTENED BOTH FEET    TONSILLECTOMY AND ADENOIDECTOMY  1950   torn miniscus  2003   X2   UPPER GASTROINTESTINAL ENDOSCOPY     VAGINAL DELIVERY     X2    FAMILY HISTORY: Family History  Problem Relation Age of Onset   Stroke Mother    Brain cancer Mother    Lung cancer Mother    Heart attack Mother    Emphysema Father    Alzheimer's disease  Sister    Dementia Other        evident after a fall with a hip fracture   Colon cancer Neg Hx    Colon polyps Neg Hx    Esophageal cancer Neg Hx    Rectal cancer Neg Hx    Stomach cancer Neg Hx     SOCIAL HISTORY: Social History   Socioeconomic History   Marital status: Married    Spouse name: [name]   Number of children: 2   Years of education: Grad sch.   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   Patient has two adult children.   Patient is retired.   Patient has a Production designer, theatre/television/film.   Patient is right-handed.   Patient drinks 5 cups of caffeine/week, at home drinks decaf coffee.   Social Determinants of Health   Financial Resource Strain: Low Risk  (10/29/2021)   Overall Financial Resource Strain (CARDIA)    Difficulty of Paying Living Expenses: Not hard at all  Food Insecurity: No Food Insecurity (10/29/2021)   Hunger Vital Sign    Worried About Running Out of Food in the Last Year: Never true    Ran Out of Food in the Last Year: Never true  Transportation Needs: No Transportation Needs (10/29/2021)   PRAPARE - Hydrologist (Medical): No    Lack of Transportation (Non-Medical): No  Physical Activity: Insufficiently Active (10/29/2021)   Exercise Vital Sign    Days of Exercise per Week: 4 days    Minutes of Exercise per Session: 30 min  Stress: No Stress Concern Present (10/29/2021)   [place]    Feeling of Stress : Not at all  Social Connections: [place] (09/15/2021)   Social Connection and Isolation Panel [NHANES]    Frequency of Communication with Friends and Family: More than three times a week    Frequency of Social Gatherings with Friends and  Family: More than three times a week    Attends Religious Services: More than 4 times per year    Active Member of Clubs or Organizations: Yes    Attends Archivist Meetings: 1 to 4 times per year    Marital Status: Married  Human resources officer Violence: Not At Risk (09/15/2021)   Humiliation, Afraid, Rape, and Kick questionnaire    Fear of Current or Ex-Partner: No    Emotionally Abused: No    Physically Abused: No    Sexually Abused: No      PHYSICAL EXAM  Vitals:   07/29/22 1420  BP: 126/64  Pulse: 80  Weight: 179 lb (81.2 kg)  Height: '5\' 5"'$  (1.651 m)   Body mass index is 29.79 kg/m.  Generalized: Well developed, in no acute distress  No peripheral edema, no chronic pain, normal posture.  Well groomed.   Hoarse voice , chronically  Neurological examination  Mentation: Alert oriented to time, place, history taking. Follows all commands speech and language fluent Cranial nerve : normal  pupillary response to light, equal pupil size and shape.  Extraocular movements were tested with red lens test and with monocular occlusion.  Single eye testing shows that the patient is able to direct her gaze in all 4 planes upward downward medial and lateral but there is a clear downbeat nystagmus noticed on horizontal eye movements.  This is present to the same extent the same frequency the same amplitude in both eyes with horizontal gaze testing.  The patient reports diplopia and this could well be established by this abnormal eye movement.   Motor: full strength of all 4 extremities- symmetric motor tone , bilateral good grip strength   Gait  and station: Gait is normal by width and speed, turns are not fragmented. Normal arm swing. . No drift, weakness  limping, not stooped.   DIAGNOSTIC DATA (LABS, IMAGING, TESTING) - I reviewed patient records, labs, notes, testing and imaging myself where available.  Dr. Doristine Church D ANG DEA at [facility] noted on 7-19 2023 that the patient was referred by Dr. York Spaniel for a prism check and had previously seen Dr. Everitt Amber.  Diplopia first onset since 2018.  She had a negative acetylcholine receptor antibody test.  She had a normal MRI of the brain.  She had a history of craniotomy in September 2020 for trigeminal neuralgia with vascular decompression.  There was no history known of childhood strabismus, lazy eye "".   Lab Results  Component Value Date   WBC 4.9 10/08/2021   HGB 13.8 10/08/2021   HCT 41.6 10/08/2021   MCV 90.2 10/08/2021   PLT 216.0 10/08/2021      Component Value Date/Time   NA 140 10/08/2021 0943   NA 139 04/11/2019 0925   K 4.1 10/08/2021 0943   CL 105 10/08/2021 0943   CO2 29 10/08/2021 0943   GLUCOSE 98 10/08/2021 0943   BUN 20 10/08/2021 0943   BUN 14 04/11/2019 0925   CREATININE 0.76 10/08/2021 0943   CREATININE 0.79 02/05/2013 1117   CALCIUM 9.3 10/08/2021 0943   PROT 6.4 10/08/2021 0943   PROT 6.5 04/11/2019 0925   ALBUMIN 4.2 10/08/2021 0943   ALBUMIN 4.3 04/11/2019 0925   AST 21 10/08/2021 0943   ALT 14 10/08/2021 0943   ALKPHOS 71 10/08/2021 0943   BILITOT 0.6 10/08/2021 0943   BILITOT 0.2 04/11/2019 0925   GFRNONAA 79 04/11/2019 0925   GFRAA 91 04/11/2019 0925  Lab Results  Component Value Date   CHOL 167 10/08/2021   HDL 59.70 10/08/2021   LDLCALC 87 10/08/2021   LDLDIRECT 106.9 05/22/2013   TRIG 104.0 10/08/2021   CHOLHDL 3 10/08/2021   Lab Results  Component Value Date   HGBA1C 5.5 02/27/2017   No results found for: "VITAMINB12" Lab Results  Component Value Date   TSH 2.68 10/08/2021      ASSESSMENT AND  PLAN 76 y.o. year old female  has a past medical history of Allergy, Atypical facial pain, Hyperlipidemia (08/24/2017), Neuralgia, Sleep apnea, and Trigeminal neuralgia. here with:  NYSTAGMUS_ downbeat - Her medications were reviewed and the only medication I could think of that could maybe affect muscle strength overall would be Lipitor but that would manifest in the large muscles of the torso not in or so not an eye movement abnormality.  The patient is not a tobacco user and never was.   So she has hypertropia of the right eye, convergence insufficiency with diplopia early this diplopia, and down weakness downbeat nystagmus best seen in horizontal eye movements.     So we will order an MRI of the brain with special attention to the brainstem.   Diplopia and hypertropia  right eye, convergence insufficiency.   2) Downbeat nystagmus- needs MRI brain , with and without contrast gadolinum and with 3 mm cuts through brainstem, no history of malignancy.  The differential diagnosis for DBN should also include neoplasia of the cervicomedullary junction or cerebellum, stroke, trauma, paraneoplastic symdromes, drug effects (anti-epileptics, alcohol, lithium), demyelination, nutritional deficiencies (magnesium, B-12), and brainstem encephalitis. Follow up in 2 months.   3.OSA on CPAP  - CPAP compliance excellent - Good treatment of AHI  - Encourage patient to use CPAP nightly and > 4 hours each night - F/U  for OSA in 1 year or sooner if needed    Larey Seat  07/29/2022, 2:47 PM [facility] [address] [place], [place] [address] [phone]

## 2022-07-29 NOTE — Addendum Note (Signed)
Addended by: Larey Seat on: 07/29/2022 03:16 PM   Modules accepted: Orders

## 2022-07-29 NOTE — Telephone Encounter (Signed)
[employer] sent to [facility] they obtain auth

## 2022-07-29 NOTE — Patient Instructions (Addendum)
Nystagmus Nystagmus is a condition that causes uncontrollable eye movements in all directions. The eye movements can go from side to side (horizontal nystagmus), up and down (vertical nystagmus), or in a circular motion (rotary nystagmus). The movements may come and go, be continual, or happen only in certain positions or with certain head movements. The movements are often rapid, rhythmic, and repetitive. Nystagmus usually affects both eyes and causes vision problems. You may also feel off-balance or dizzy. There are different types of nystagmus. They are categorized by when they develop. Infantile nystagmus develops in babies who are around 50 months old. Spasmus nutans develops in children who are between age 77 months and 7 years old. Acquired nystagmus occurs in older children and adults. What are the causes? The most common cause of nystagmus is a neurological problem that a baby is born with or develops shortly after birth. Acquired nystagmus can be caused by a condition that affects the part of the brain that controls eye movement or the part of the inner ear that controls balance. Common disorders that cause nystagmus include: Multiple sclerosis. Brain tumor. Brain injury or infection. Stroke. Inner ear diseases, like Meniere disease, that also cause spinning dizziness. Loss of balance. Alcohol or drug abuse. Some medicines, including medicines that treat bipolar disorder and seizures. Sometimes the cause is not known. What are the signs or symptoms? Symptoms of this condition include: Shaky or wobbly vision. Having to turn your head sideways to see better. Sensitivity to light. Difficulty seeing in the dark. Blurred vision. Loss of depth perception. Vertigo. Dizziness or imbalance. How is this diagnosed? Your health care provider can diagnose nystagmus based on an eye exam. Nystagmus may be diagnosed if your eyes move in any direction, and the movement is rapid, repetitive,  rhythmic, and uncontrolled. You may need additional exams by specialists to rule out other conditions that can cause nystagmus. These may include: An eye exam by an eye specialist (ophthalmologist). An ear exam by an ear, nose, and throat specialist (otolaryngologist). An exam by a brain and nervous system specialist (neurologist). You may also have certain tests at the exams, including imaging studies (like an MRI or CT scan), or a test to measure your eye movements (electronystagmography or ENG). How is this treated? There is no specific treatment for nystagmus. Spasmus nutans often improves without treatment by age 10. Treating the condition that caused nystagmus can relieve symptoms. Follow these instructions at home:  Take over-the-counter and prescription medicines only as told by your health care provider. Return to your normal activities as told by your health care provider. Ask your health care provider what activities are safe for you Keep all follow-up visits. This is important. Contact a health care provider if: You have any new disturbance in your vision. You have loss of balance. You have dizziness or spinning vertigo. Get help right away if: Your vision problems worsen. You have severe headaches. You faint. Summary Nystagmus is a condition that causes uncontrollable eye movements in all directions. It usually affects both eyes and causes vision problems. The most common cause of nystagmus is a neurological problem that a baby is born with or develops shortly after birth. Nystagmus can also be caused by a condition that affects the part of the brain that controls eye movement or the part of the inner ear that controls balance. There is no specific treatment for nystagmus, but treating another condition that causes nystagmus can relieve symptoms. This information is not intended to replace advice  given to you by your health care provider. Make sure you discuss any questions  you have with your health care provider. Document Revised: 08/26/2020 Document Reviewed: 08/26/2020 Elsevier Patient Education  [place].   The differential diagnosis for DBN should also include neoplasia of the cervicomedullary junction or cerebellum, stroke, trauma, paraneoplastic symdromes, drug effects (anti-epileptics, alcohol, lithium), demyelination, nutritional deficiencies (magnesium, B-12), and brainstem encephalitis.

## 2022-08-07 LAB — COMPREHENSIVE METABOLIC PANEL
ALT: 14 IU/L (ref 0–32)
AST: 27 IU/L (ref 0–40)
Albumin/Globulin Ratio: 1.8 (ref 1.2–2.2)
Albumin: 4.4 g/dL (ref 3.8–4.8)
Alkaline Phosphatase: 85 IU/L (ref 44–121)
BUN/Creatinine Ratio: 27 (ref 12–28)
BUN: 24 mg/dL (ref 8–27)
Bilirubin Total: 0.2 mg/dL (ref 0.0–1.2)
CO2: 23 mmol/L (ref 20–29)
Calcium: 10.3 mg/dL (ref 8.7–10.3)
Chloride: 105 mmol/L (ref 96–106)
Creatinine, Ser: 0.88 mg/dL (ref 0.57–1.00)
Globulin, Total: 2.5 g/dL (ref 1.5–4.5)
Glucose: 81 mg/dL (ref 70–99)
Potassium: 5.3 mmol/L — ABNORMAL HIGH (ref 3.5–5.2)
Sodium: 143 mmol/L (ref 134–144)
Total Protein: 6.9 g/dL (ref 6.0–8.5)
eGFR: 68 mL/min/{1.73_m2} (ref >59)

## 2022-08-07 LAB — MUSK ANTIBODIES: MuSK Antibodies: <1 U/mL

## 2022-08-07 LAB — ACHR ABS WITH REFLEX TO MUSK: AChR Binding Ab, Serum: <0.03 nmol/L (ref 0.00–0.24)

## 2022-08-07 LAB — MAGNESIUM: Magnesium: 2.2 mg/dL (ref 1.6–2.3)

## 2022-08-09 NOTE — Progress Notes (Signed)
Negative for Ach Receptor AB and MUSK reflex.

## 2022-08-12 ENCOUNTER — Ambulatory Visit
Admission: RE | Admit: 2022-08-12 | Discharge: 2022-08-12 | Disposition: A | Discharge status: Home / Self Care | Payer: Medicare HMO | Source: Ambulatory Visit | Attending: Neurology | Admitting: Neurology

## 2022-08-12 DIAGNOSIS — H532 Diplopia: Secondary | ICD-10-CM | POA: Diagnosis not present

## 2022-08-12 DIAGNOSIS — H5509 Other forms of nystagmus: Secondary | ICD-10-CM | POA: Diagnosis not present

## 2022-08-12 MED ORDER — GADOBENATE DIMEGLUMINE 529 MG/ML IV SOLN
17.0000 mL | Freq: Once | INTRAVENOUS | Status: AC | PRN
  Administered 2022-08-12: 17 mL via INTRAVENOUS

## 2022-08-13 ENCOUNTER — Encounter: Payer: Self-pay | Admitting: Neurology

## 2022-08-13 DIAGNOSIS — G4733 Obstructive sleep apnea (adult) (pediatric): Secondary | ICD-10-CM | POA: Diagnosis not present

## 2022-08-16 ENCOUNTER — Encounter: Payer: Self-pay | Admitting: Family Medicine

## 2022-08-17 ENCOUNTER — Telehealth: Payer: Self-pay

## 2022-08-17 NOTE — Telephone Encounter (Signed)
-----   Message from Larey Seat, MD sent at 08/16/2022  4:46 PM EDT ----- CLINICAL HISTORY: 76 y.o. year old female with: 1. Downbeat nystagmus  2. Binocular vision disorder with diplopia    MRI brain (with and without) demonstrating: - Mild chronic small vessel ischemic disease, slightly increased from 2018. - No acute findings in orbit and optic nerves, in brain stem/ pons.  Dr Andrey Spearman

## 2022-08-17 NOTE — Telephone Encounter (Signed)
I called patient.  I discussed her MRI brain results.  She would like to cancel the October appointment with Dr. Brett Fairy.  She has a pending ophthalmology appointment in November.  She would like to schedule a 76-monthfollow-up with MJinny Blossom NP for her CPAP.  This appointment was scheduled for February 28 at 11:30 with MJinny Blossom NP.  Patient verbalized understanding of results and of new appointment date and time.  Patient had no further questions or concerns at this time.

## 2022-09-08 ENCOUNTER — Telehealth: Payer: PPO | Admitting: Adult Health

## 2022-09-24 ENCOUNTER — Ambulatory Visit (INDEPENDENT_AMBULATORY_CARE_PROVIDER_SITE_OTHER): Payer: Medicare HMO

## 2022-09-24 VITALS — Ht 65.0 in | Wt 180.0 lb

## 2022-09-24 DIAGNOSIS — Z0000 Encounter for general adult medical examination without abnormal findings: Secondary | ICD-10-CM

## 2022-09-24 NOTE — Patient Instructions (Signed)
Laura Stuart , Thank you for taking time to come for your Medicare Wellness Visit. I appreciate your ongoing commitment to your health goals. Please review the following plan we discussed and let me know if I can assist you in the future.   These are the goals we discussed:  Goals      Increase physical activity     patient     Maintain current health by staying active. Drink more water        This is a list of the screening recommended for you and due dates:  Health Maintenance  Topic Date Due   COVID-19 Vaccine (5 - Moderna series) 10/09/2021   Flu Shot  06/30/2022   Mammogram  10/14/2022   Medicare Annual Wellness Visit  10/25/2023   DEXA scan (bone density measurement)  10/28/2023   Colon Cancer Screening  06/28/2025   Tetanus Vaccine  08/04/2028   Pneumonia Vaccine  Completed   Hepatitis C Screening: USPSTF Recommendation to screen - Ages 18-79 yo.  Completed   Zoster (Shingles) Vaccine  Completed   HPV Vaccine  Aged Out    Advanced directives: In Chart   Conditions/risks identified: Aim for 30 minutes of exercise or brisk walking, 6-8 glasses of water, and 5 servings of fruits and vegetables each day.   Next appointment: Follow up in one year for your annual wellness visit    Preventive Care 65 Years and Older, Female Preventive care refers to lifestyle choices and visits with your health care provider that can promote health and wellness. What does preventive care include? A yearly physical exam. This is also called an annual well check. Dental exams once or twice a year. Routine eye exams. Ask your health care provider how often you should have your eyes checked. Personal lifestyle choices, including: Daily care of your teeth and gums. Regular physical activity. Eating a healthy diet. Avoiding tobacco and drug use. Limiting alcohol use. Practicing safe sex. Taking low-dose aspirin every day. Taking vitamin and mineral supplements as recommended by your health  care provider. What happens during an annual well check? The services and screenings done by your health care provider during your annual well check will depend on your age, overall health, lifestyle risk factors, and family history of disease. Counseling  Your health care provider may ask you questions about your: Alcohol use. Tobacco use. Drug use. Emotional well-being. Home and relationship well-being. Sexual activity. Eating habits. History of falls. Memory and ability to understand (cognition). Work and work Statistician. Reproductive health. Screening  You may have the following tests or measurements: Height, weight, and BMI. Blood pressure. Lipid and cholesterol levels. These may be checked every 5 years, or more frequently if you are over 48 years old. Skin check. Lung cancer screening. You may have this screening every year starting at age 70 if you have a 30-pack-year history of smoking and currently smoke or have quit within the past 15 years. Fecal occult blood test (FOBT) of the stool. You may have this test every year starting at age 31. Flexible sigmoidoscopy or colonoscopy. You may have a sigmoidoscopy every 5 years or a colonoscopy every 10 years starting at age 37. Hepatitis C blood test. Hepatitis B blood test. Sexually transmitted disease (STD) testing. Diabetes screening. This is done by checking your blood sugar (glucose) after you have not eaten for a while (fasting). You may have this done every 1-3 years. Bone density scan. This is done to screen for osteoporosis. You may  have this done starting at age 36. Mammogram. This may be done every 1-2 years. Talk to your health care provider about how often you should have regular mammograms. Talk with your health care provider about your test results, treatment options, and if necessary, the need for more tests. Vaccines  Your health care provider may recommend certain vaccines, such as: Influenza vaccine. This is  recommended every year. Tetanus, diphtheria, and acellular pertussis (Tdap, Td) vaccine. You may need a Td booster every 10 years. Zoster vaccine. You may need this after age 51. Pneumococcal 13-valent conjugate (PCV13) vaccine. One dose is recommended after age 25. Pneumococcal polysaccharide (PPSV23) vaccine. One dose is recommended after age 50. Talk to your health care provider about which screenings and vaccines you need and how often you need them. This information is not intended to replace advice given to you by your health care provider. Make sure you discuss any questions you have with your health care provider. Document Released: 12/13/2015 Document Revised: 08/05/2016 Document Reviewed: 09/17/2015 Elsevier Interactive Patient Education  2017 [place] Prevention in the Home Falls can cause injuries. They can happen to people of all ages. There are many things you can do to make your home safe and to help prevent falls. What can I do on the outside of my home? Regularly fix the edges of walkways and driveways and fix any cracks. Remove anything that might make you trip as you walk through a door, such as a raised step or threshold. Trim any bushes or trees on the path to your home. Use bright outdoor lighting. Clear any walking paths of anything that might make someone trip, such as rocks or tools. Regularly check to see if handrails are loose or broken. Make sure that both sides of any steps have handrails. Any raised decks and porches should have guardrails on the edges. Have any leaves, snow, or ice cleared regularly. Use sand or salt on walking paths during winter. Clean up any spills in your garage right away. This includes oil or grease spills. What can I do in the bathroom? Use night lights. Install grab bars by the toilet and in the tub and shower. Do not use towel bars as grab bars. Use non-skid mats or decals in the tub or shower. If you need to sit down in  the shower, use a plastic, non-slip stool. Keep the floor dry. Clean up any water that spills on the floor as soon as it happens. Remove soap buildup in the tub or shower regularly. Attach bath mats securely with double-sided non-slip rug tape. Do not have throw rugs and other things on the floor that can make you trip. What can I do in the bedroom? Use night lights. Make sure that you have a light by your bed that is easy to reach. Do not use any sheets or blankets that are too big for your bed. They should not hang down onto the floor. Have a firm chair that has side arms. You can use this for support while you get dressed. Do not have throw rugs and other things on the floor that can make you trip. What can I do in the kitchen? Clean up any spills right away. Avoid walking on wet floors. Keep items that you use a lot in easy-to-reach places. If you need to reach something above you, use a strong step stool that has a grab bar. Keep electrical cords out of the way. Do not use floor polish  or wax that makes floors slippery. If you must use wax, use non-skid floor wax. Do not have throw rugs and other things on the floor that can make you trip. What can I do with my stairs? Do not leave any items on the stairs. Make sure that there are handrails on both sides of the stairs and use them. Fix handrails that are broken or loose. Make sure that handrails are as long as the stairways. Check any carpeting to make sure that it is firmly attached to the stairs. Fix any carpet that is loose or worn. Avoid having throw rugs at the top or bottom of the stairs. If you do have throw rugs, attach them to the floor with carpet tape. Make sure that you have a light switch at the top of the stairs and the bottom of the stairs. If you do not have them, ask someone to add them for you. What else can I do to help prevent falls? Wear shoes that: Do not have high heels. Have rubber bottoms. Are comfortable  and fit you well. Are closed at the toe. Do not wear sandals. If you use a stepladder: Make sure that it is fully opened. Do not climb a closed stepladder. Make sure that both sides of the stepladder are locked into place. Ask someone to hold it for you, if possible. Clearly mark and make sure that you can see: Any grab bars or handrails. First and last steps. Where the edge of each step is. Use tools that help you move around (mobility aids) if they are needed. These include: Canes. Walkers. Scooters. Crutches. Turn on the lights when you go into a dark area. Replace any light bulbs as soon as they burn out. Set up your furniture so you have a clear path. Avoid moving your furniture around. If any of your floors are uneven, fix them. If there are any pets around you, be aware of where they are. Review your medicines with your doctor. Some medicines can make you feel dizzy. This can increase your chance of falling. Ask your doctor what other things that you can do to help prevent falls. This information is not intended to replace advice given to you by your health care provider. Make sure you discuss any questions you have with your health care provider. Document Released: 09/12/2009 Document Revised: 04/23/2016 Document Reviewed: 12/21/2014 Elsevier Interactive Patient Education  2017 [employer].

## 2022-09-24 NOTE — Progress Notes (Signed)
Subjective:   Laura Stuart is a 76 y.o. female who presents for Medicare Annual (Subsequent) preventive examination.   I connected with  Laura Stuart on 09/24/22 by a audio enabled telemedicine application and verified that I am speaking with the correct person using two identifiers.  Patient Location: Home  Provider Location: Home Office  I discussed the limitations of evaluation and management by telemedicine. The patient expressed understanding and agreed to proceed.  Review of Systems     Cardiac Risk Factors include: advanced age (>65mn, >>73women);hypertension     Objective:    Today's Vitals   09/24/22 0928  Weight: 180 lb (81.6 kg)  Height: '5\' 5"'$  (1.651 m)   Body mass index is 29.95 kg/m.     09/24/2022    9:33 AM 10/29/2021    9:13 AM 09/15/2021   10:34 AM 02/19/2021    2:11 PM 09/09/2020    3:07 PM 08/31/2019    2:54 PM 08/24/2017    9:33 AM  Advanced Directives  Does Patient Have a Medical Advance Directive? Yes Yes Yes Yes Yes Yes Yes  Type of AParamedicof AGlens Falls NorthLiving will HPierp[place]Living will Healthcare Power of [place]Living will HPacific JunctionLiving will HBirnamwoodLiving will Living will;Healthcare Power of Attorney  Does patient want to make changes to medical advance directive? No - Patient declined No - Patient declined   Yes (MAU/Ambulatory/Procedural Areas - Information given) Yes (MAU/Ambulatory/Procedural Areas - Information given)   Copy of HFalcon Lake Estatesin Chart? Yes - validated most recent copy scanned in chart (See row information) Yes - validated most recent copy scanned in chart (See row information) No - copy requested  No - copy requested No - copy requested No - copy requested    Current Medications (verified) Outpatient Encounter Medications as of 09/24/2022  Medication Sig   atorvastatin (LIPITOR)  10 MG tablet Take 10 mg by mouth 2 (two) times a week.   Calcium Carb-Cholecalciferol (CALCIUM CARBONATE-VITAMIN D3 PO) Take 1 tablet by mouth daily. 1200 mg daily WITH 5000 IU D   cetirizine (ZYRTEC) 10 MG tablet TAKE 1 TABLET BY MOUTH DAILY (Patient taking differently: Take 10 mg by mouth daily as needed for allergies.)   fluticasone (FLONASE) 50 MCG/ACT nasal spray INSTILL 2 SPRAYS INTO EACH NOSTRIL DAILY (Patient taking differently: Place 2 sprays into both nostrils daily as needed for allergies or rhinitis.)   glucosamine-chondroitin 500-400 MG tablet Take 1 tablet by mouth 3 (three) times daily.   No facility-administered encounter medications on file as of 09/24/2022.    Allergies (verified) Penicillins   History: Past Medical History:  Diagnosis Date   Allergy    Atypical facial pain    Hyperlipidemia 08/24/2017   Neuralgia    Trigeminal   Sleep apnea    using cpap,OSA   Trigeminal neuralgia    PAST HX- HAD CRANIOTOMY 08-14-2019 FIXED THIS    Past Surgical History:  Procedure Laterality Date   COLONOSCOPY     FOOT SURGERY Left 05/07/2017   POLYPECTOMY     ROOT CANAL  06/2013   toes right foot Bilateral 2010   STRAIGHTENED BOTH FEET    TONSILLECTOMY AND ADENOIDECTOMY  1950   torn miniscus  2003   X2   UPPER GASTROINTESTINAL ENDOSCOPY     VAGINAL DELIVERY     X2   Family History  Problem Relation Age of Onset   Stroke Mother  Brain cancer Mother    Lung cancer Mother    Heart attack Mother    Emphysema Father    Alzheimer's disease Sister    Dementia Other        evident after a fall with a hip fracture   Colon cancer Neg Hx    Colon polyps Neg Hx    Esophageal cancer Neg Hx    Rectal cancer Neg Hx    Stomach cancer Neg Hx    Social History   Socioeconomic History   Marital status: Married    Spouse name: [name]   Number of children: 2   Years of education: Grad sch.   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   Patient has two adult children.   Patient is retired.   Patient has a Production designer, theatre/television/film.   Patient is right-handed.   Patient drinks 5 cups of caffeine/week, at home drinks decaf coffee.   Social Determinants of Health   Financial Resource Strain: Low Risk  (09/24/2022)   Overall Financial Resource Strain (CARDIA)    Difficulty of Paying Living Expenses: Not hard at all  Food Insecurity: No Food Insecurity (09/24/2022)   Hunger Vital Sign    Worried About Running Out of Food in the Last Year: Never true    Ran Out of Food in the Last Year: Never true  Transportation Needs: No Transportation Needs (09/24/2022)   PRAPARE - Hydrologist (Medical): No    Lack of Transportation (Non-Medical): No  Physical Activity: Insufficiently Active (09/24/2022)   Exercise Vital Sign    Days of Exercise per Week: 3 days    Minutes of Exercise per Session: 30 min  Stress: No Stress Concern Present (09/24/2022)   [place]    Feeling of Stress : Not at all  Social Connections: [place] (09/24/2022)   Social Connection and Isolation Panel [NHANES]    Frequency of Communication with Friends and Family: More than three times a week    Frequency of Social Gatherings with Friends and Family: More than three times a week    Attends Religious Services: More than 4 times per year    Active Member of  [employer] or Organizations: Yes    Attends Music therapist: More than 4 times per year    Marital Status: Married    Tobacco Counseling Counseling given: Not Answered   Clinical Intake:  Pre-visit preparation completed: Yes  Pain : No/denies pain     Nutritional Risks: None Diabetes: No  How often do you need to have someone help you when you read instructions, pamphlets, or other written materials from your doctor or pharmacy?: 1 - Never  Diabetic?no   Interpreter Needed?: No  Information entered by :: Jadene Pierini, LPN   Activities of  Daily Living    09/24/2022    9:32 AM 10/08/2021    9:03 AM  In your present state of health, do you have any difficulty performing the following activities:  Hearing? 0 0  Vision? 0 1  Difficulty concentrating or making decisions? 0 0  Walking or climbing stairs? 0 0  Dressing or bathing? 0 0  Doing errands, shopping? 0 0  Preparing Food and eating ? N   Using the Toilet? N   In the past six months, have you accidently leaked urine? N   Do you have problems with loss of bowel control? N   Managing your Medications? N   Managing your Finances? N   Housekeeping or managing your Housekeeping? N     Patient Care Team: Midge Minium, MD as PCP - General (Family Medicine) Dohmeier, Asencion Partridge, MD as Consulting Physician (Neurology) Irene Shipper, MD as Consulting Physician (Gastroenterology) Garrel Ridgel, DPM as Consulting Physician (Podiatry) Judeth Cornfield, MD  Indicate any recent Medical Services you may have received from other than [facility] providers in the past year (date may be approximate).     Assessment:   This is a routine wellness examination for Laura.  Hearing/Vision screen Vision Screening - Comments:: Annual eye exams wears glasses   Dietary issues and exercise activities discussed: Current Exercise Habits: Home exercise routine, Type of exercise: walking, Time (Minutes): 30, Frequency  (Times/Week): 3, Weekly Exercise (Minutes/Week): 90, Intensity: Mild, Exercise limited by: None identified   Goals Addressed             This Visit's Progress    Increase physical activity   On track      Depression Screen    09/24/2022    9:31 AM 10/29/2021    9:13 AM 10/08/2021    9:03 AM 09/15/2021   10:54 AM 04/03/2021   10:37 AM 10/02/2020    1:30 PM 09/09/2020    3:15 PM  PHQ 2/9 Scores  PHQ - 2 Score 0 0 0 0 0 0 0  PHQ- 9 Score   1  0 0     Fall Risk    09/24/2022    9:29 AM 10/29/2021    9:12 AM 10/08/2021    9:02 AM 09/15/2021   10:35 AM 04/03/2021   10:37 AM  Fall Risk   Falls in the past year? 0 0 0 0 0  Number falls in past yr: 0 0 0 0 0  Injury with Fall? 0 0 0 0 0  Risk for fall due to : No Fall Risks No Fall Risks No Fall Risks  No Fall Risks  Follow up Falls prevention discussed Education provided;Falls prevention discussed  Falls evaluation completed;Falls prevention discussed     FALL RISK PREVENTION PERTAINING TO THE HOME:  Any stairs in or around the home? Yes  If so, are there any without handrails? No  Home free of loose throw rugs in walkways, pet beds, electrical cords, etc? Yes  Adequate lighting in your home to reduce risk of falls? Yes   ASSISTIVE DEVICES UTILIZED TO PREVENT FALLS:  Life alert? No  Use of a cane, walker or w/c? No  Grab bars in the bathroom? No  Shower chair or bench in shower? No  Elevated toilet seat or a handicapped toilet? No        12/28/2016    8:36 AM  Montreal Cognitive Assessment   Visuospatial/ Executive (0/5) 5  Naming (0/3) 3  Attention: Read list  of digits (0/2) 2  Attention: Read list of letters (0/1) 1  Attention: Serial 7 subtraction starting at 100 (0/3) 3  Language: Repeat phrase (0/2) 2  Language : Fluency (0/1) 1  Abstraction (0/2) 2  Delayed Recall (0/5) 4  Orientation (0/6) 6  Total 29  Adjusted Score (based on education) 29      09/24/2022    9:33 AM  6CIT Screen  What Year? 0  points  What month? 0 points  What time? 0 points  Count back from 20 0 points  Months in reverse 0 points  Repeat phrase 0 points  Total Score 0 points    Immunizations Immunization History  Administered Date(s) Administered   Fluad Quad(high Dose 65+) 08/31/2019, 09/11/2021   Hepatitis A 04/20/2011   Influenza Split 10/29/2011   Influenza, High Dose Seasonal PF 08/24/2017, 08/04/2018, 09/03/2020   Influenza,inj,Quad PF,6+ Mos 08/14/2014, 08/20/2015, 08/06/2016   Moderna SARS-COV2 Booster Vaccination 09/24/2020   Moderna Sars-Covid-2 Vaccination 12/27/2019, 01/25/2020, 03/19/2021   PFIZER(Purple Top)SARS-COV-2 Vaccination 08/14/2021   Pneumococcal Conjugate-13 08/20/2015   Pneumococcal Polysaccharide-23 05/22/2013   Tdap 08/04/2018   Typhoid Parenteral 04/20/2011   Zoster Recombinat (Shingrix) 03/12/2018, 06/10/2018   Zoster, Live 11/30/2004    TDAP status: Up to date  Flu Vaccine status: Up to date  Pneumococcal vaccine status: Up to date  Covid-19 vaccine status: Completed vaccines  Qualifies for Shingles Vaccine? Yes   Zostavax completed Yes   Shingrix Completed?: Yes  Screening Tests Health Maintenance  Topic Date Due   COVID-19 Vaccine (5 - Moderna series) 10/09/2021   INFLUENZA VACCINE  06/30/2022   MAMMOGRAM  10/14/2022   Medicare Annual Wellness (AWV)  10/25/2023   DEXA SCAN  10/28/2023   COLONOSCOPY (Pts 45-31yr Insurance coverage will need to be confirmed)  06/28/2025   TETANUS/TDAP  08/04/2028   Pneumonia Vaccine 76 Years old  Completed   Hepatitis C Screening  Completed   Zoster Vaccines- Shingrix  Completed   HPV VACCINES  Aged Out    Health Maintenance  Health Maintenance Due  Topic Date Due   COVID-19 Vaccine (5 - Moderna series) 10/09/2021   INFLUENZA VACCINE  06/30/2022    Colorectal cancer screening: No longer required.   Mammogram status: No longer required due to age .  Bone Density status: Completed 05/16/2019. Results  reflect: Bone density results: OSTEOPENIA. Repeat every 5 years.  Lung Cancer Screening: (Low Dose CT Chest recommended if Age 76-80years, 30 pack-year currently smoking OR have quit w/in 15years.) does not qualify.   Lung Cancer Screening Referral: n/a  Additional Screening:  Hepatitis C Screening: does not qualify;   Vision Screening: Recommended annual ophthalmology exams for early detection of glaucoma and other disorders of the eye. Is the patient up to date with their annual eye exam?  Yes  Who is the provider or what is the name of the office in which the patient attends annual eye exams? Dr.Bowan  If pt is not established with a provider, would they like to be referred to a provider to establish care? No .   Dental Screening: Recommended annual dental exams for proper oral hygiene  Community Resource Referral / Chronic Care Management: CRR required this visit?  No   CCM required this visit?  No      Plan:     I have personally reviewed and noted the following in the patient's chart:   Medical and social history Use of alcohol, tobacco or illicit drugs  Current medications  and supplements including opioid prescriptions. Patient is not currently taking opioid prescriptions. Functional ability and status Nutritional status Physical activity Advanced directives List of other physicians Hospitalizations, surgeries, and ER visits in previous 12 months Vitals Screenings to include cognitive, depression, and falls Referrals and appointments  In addition, I have reviewed and discussed with patient certain preventive protocols, quality metrics, and best practice recommendations. A written personalized care plan for preventive services as well as general preventive health recommendations were provided to patient.     Daphane Shepherd, LPN   30/07/7948   Nurse Notes: none

## 2022-09-28 ENCOUNTER — Ambulatory Visit: Payer: Medicare HMO | Admitting: Neurology

## 2022-10-09 ENCOUNTER — Telehealth: Payer: Self-pay

## 2022-10-09 ENCOUNTER — Encounter: Payer: Self-pay | Admitting: Family Medicine

## 2022-10-09 ENCOUNTER — Ambulatory Visit (INDEPENDENT_AMBULATORY_CARE_PROVIDER_SITE_OTHER): Payer: Medicare HMO | Admitting: Family Medicine

## 2022-10-09 VITALS — BP 118/80 | HR 72 | Temp 99.0°F | Resp 17 | Ht 65.0 in | Wt 184.0 lb

## 2022-10-09 DIAGNOSIS — Z0000 Encounter for general adult medical examination without abnormal findings: Secondary | ICD-10-CM

## 2022-10-09 DIAGNOSIS — E669 Obesity, unspecified: Secondary | ICD-10-CM | POA: Diagnosis not present

## 2022-10-09 DIAGNOSIS — E673 Hypervitaminosis D: Secondary | ICD-10-CM

## 2022-10-09 DIAGNOSIS — E559 Vitamin D deficiency, unspecified: Secondary | ICD-10-CM

## 2022-10-09 LAB — BASIC METABOLIC PANEL
BUN: 19 mg/dL (ref 6–23)
CO2: 32 mEq/L (ref 19–32)
Calcium: 9.7 mg/dL (ref 8.4–10.5)
Chloride: 105 mEq/L (ref 96–112)
Creatinine, Ser: 0.73 mg/dL (ref 0.40–1.20)
GFR: 79.87 mL/min (ref >60.00)
Glucose, Bld: 89 mg/dL (ref 70–99)
Potassium: 4.5 mEq/L (ref 3.5–5.1)
Sodium: 141 mEq/L (ref 135–145)

## 2022-10-09 LAB — HEPATIC FUNCTION PANEL
ALT: 18 U/L (ref 0–35)
AST: 30 U/L (ref 0–37)
Albumin: 4.2 g/dL (ref 3.5–5.2)
Alkaline Phosphatase: 71 U/L (ref 39–117)
Bilirubin, Direct: 0.1 mg/dL (ref 0.0–0.3)
Total Bilirubin: 0.4 mg/dL (ref 0.2–1.2)
Total Protein: 6.8 g/dL (ref 6.0–8.3)

## 2022-10-09 LAB — LIPID PANEL
Cholesterol: 187 mg/dL (ref 0–200)
HDL: 59.6 mg/dL (ref >39.00)
LDL Cholesterol: 95 mg/dL (ref 0–99)
NonHDL: 127.33
Total CHOL/HDL Ratio: 3
Triglycerides: 163 mg/dL — ABNORMAL HIGH (ref 0.0–149.0)
VLDL: 32.6 mg/dL (ref 0.0–40.0)

## 2022-10-09 LAB — CBC WITH DIFFERENTIAL/PLATELET
Basophils Absolute: 0.1 10*3/uL (ref 0.0–0.1)
Basophils Relative: 1 % (ref 0.0–3.0)
Eosinophils Absolute: 0.2 10*3/uL (ref 0.0–0.7)
Eosinophils Relative: 2.6 % (ref 0.0–5.0)
HCT: 40.5 % (ref 36.0–46.0)
Hemoglobin: 13.6 g/dL (ref 12.0–15.0)
Lymphocytes Relative: 24.5 % (ref 12.0–46.0)
Lymphs Abs: 1.4 10*3/uL (ref 0.7–4.0)
MCHC: 33.6 g/dL (ref 30.0–36.0)
MCV: 88.7 fl (ref 78.0–100.0)
Monocytes Absolute: 0.4 10*3/uL (ref 0.1–1.0)
Monocytes Relative: 7.7 % (ref 3.0–12.0)
Neutro Abs: 3.7 10*3/uL (ref 1.4–7.7)
Neutrophils Relative %: 64.2 % (ref 43.0–77.0)
Platelets: 263 10*3/uL (ref 150.0–400.0)
RBC: 4.57 Mil/uL (ref 3.87–5.11)
RDW: 14.4 % (ref 11.5–15.5)
WBC: 5.8 10*3/uL (ref 4.0–10.5)

## 2022-10-09 LAB — VITAMIN D 25 HYDROXY (VIT D DEFICIENCY, FRACTURES): VITD: 112.05 ng/mL (ref 30.00–100.00)

## 2022-10-09 LAB — TSH: TSH: 3.29 u[IU]/mL (ref 0.35–5.50)

## 2022-10-09 NOTE — Telephone Encounter (Signed)
I would hold all calcium and Vit D until we recheck her labs

## 2022-10-09 NOTE — Patient Instructions (Signed)
Follow up in 1 year or as needed We'll notify you of your lab results and make any changes if needed Keep up the good work on healthy diet and regular exercise- you can do it! Ask your team about neuro/vestibular rehab for the nystagmus Call with any questions or concerns Stay Safe!  Stay Healthy! Happy Holidays!!!

## 2022-10-09 NOTE — Telephone Encounter (Signed)
Lab called stating pt Vitamin D is critical 112.05

## 2022-10-09 NOTE — Telephone Encounter (Signed)
Patient is asking about the calcium with vitamin D notes she had been told she has to take vitamin D to get the calcium to "work" is this true?

## 2022-10-09 NOTE — Assessment & Plan Note (Signed)
Check labs and replete prn. 

## 2022-10-09 NOTE — Telephone Encounter (Signed)
Informed pt of lab results . Advised to stop all Calcium and Vitam d until her levels come back up will recheck at next visit

## 2022-10-09 NOTE — Telephone Encounter (Signed)
-----   Message from Midge Minium, MD sent at 10/09/2022  3:39 PM EST ----- Labs look good w/ exception of high Vit D.  Calcium level is normal so I would like her to stop all Calcium and Vit D supplements until her levels are back in the normal range.

## 2022-10-09 NOTE — Telephone Encounter (Signed)
Patient was informed.

## 2022-10-09 NOTE — Assessment & Plan Note (Signed)
Pt's PE WNL w/ exception of BMI.  UTD on mammo, colonoscopy, Tdap, flu, PNA.  Check labs.  Anticipatory guidance provided.

## 2022-10-09 NOTE — Progress Notes (Signed)
   Subjective:    Patient ID: Laura Stuart, female    DOB: December 17, 1945, 76 y.o.   MRN: [id number]  HPI CPE- UTD on mammo, DEXA, colonoscopy, Tdap, flu, PNA  Patient Care Team    Relationship Specialty Notifications Start End  Midge Minium, MD PCP - General Family Medicine  02/04/12    Comment: Jones Skene, Asencion Partridge, MD Consulting Physician Neurology  08/20/15   Irene Shipper, MD Consulting Physician Gastroenterology  08/20/15   Garrel Ridgel, [place] Consulting Physician Podiatry  08/24/17   Judeth Cornfield, MD    06/23/22      Health Maintenance  Topic Date Due   MAMMOGRAM  10/14/2022   Medicare Annual Wellness (AWV)  09/25/2023   DEXA SCAN  10/28/2023   COLONOSCOPY (Pts 45-64yr Insurance coverage will need to be confirmed)  06/28/2025   TETANUS/TDAP  08/04/2028   Pneumonia Vaccine 76 Years old  Completed   INFLUENZA VACCINE  Completed   Hepatitis C Screening  Completed   Zoster Vaccines- Shingrix  Completed   HPV VACCINES  Aged Out   COVID-19 Vaccine  Discontinued      Review of Systems Patient reports no vision/ hearing changes, adenopathy,fever, weight change,  persistant/recurrent hoarseness , swallowing issues, chest pain, palpitations, edema, persistant/recurrent cough, hemoptysis, dyspnea (rest/exertional/paroxysmal nocturnal), gastrointestinal bleeding (melena, rectal bleeding), abdominal pain, significant heartburn, bowel changes, GU symptoms (dysuria, hematuria, incontinence), Gyn symptoms (abnormal  bleeding, pain),  syncope, focal weakness, memory loss, numbness & tingling, skin/hair/nail changes, abnormal bruising or bleeding, anxiety, or depression.     Objective:   Physical Exam General Appearance:    Alert, cooperative, no distress, appears stated age  Head:    Normocephalic, without obvious abnormality, atraumatic  Eyes:    PERRL, conjunctiva/corneas clear, EOM's intact both eyes  Ears:    Normal TM's and external ear canals, both ears  Nose:   Nares  normal, septum midline, mucosa normal, no drainage    or sinus tenderness  Throat:   Lips, mucosa, and tongue normal; teeth and gums normal  Neck:   Supple, symmetrical, trachea midline, no adenopathy;    Thyroid: no enlargement/tenderness/nodules  Back:     Symmetric, no curvature, ROM normal, no CVA tenderness  Lungs:     Clear to auscultation bilaterally, respirations unlabored  Chest Wall:    No tenderness or deformity   Heart:    Regular rate and rhythm, S1 and S2 normal, no murmur, rub   or gallop  Breast Exam:    Deferred to mammo  Abdomen:     Soft, non-tender, bowel sounds active all four quadrants,    no masses, no organomegaly  Genitalia:    Deferred  Rectal:    Extremities:   Extremities normal, atraumatic, no cyanosis or edema  Pulses:   2+ and symmetric all extremities  Skin:   Skin color, texture, turgor normal, no rashes or lesions  Lymph nodes:   Cervical, supraclavicular, and axillary nodes normal  Neurologic:   CNII-XII intact, normal strength, sensation and reflexes    throughout          Assessment & Plan:

## 2022-10-09 NOTE — Telephone Encounter (Signed)
Please have pt stop all Vit D containing supplements and we will recheck Vit D level at lab visit in 2 weeks

## 2022-10-09 NOTE — Assessment & Plan Note (Signed)
Ongoing issue.  Encouraged low carb diet and regular physical activity.  Will follow.

## 2022-10-15 ENCOUNTER — Encounter: Payer: Self-pay | Admitting: Neurology

## 2022-10-15 NOTE — Telephone Encounter (Signed)
Laura Stuart with medical records spoke with pt and pt will get CD from [facility].

## 2022-10-26 ENCOUNTER — Other Ambulatory Visit: Payer: Medicare HMO

## 2022-10-26 ENCOUNTER — Other Ambulatory Visit (INDEPENDENT_AMBULATORY_CARE_PROVIDER_SITE_OTHER): Payer: Medicare HMO

## 2022-10-26 DIAGNOSIS — E559 Vitamin D deficiency, unspecified: Secondary | ICD-10-CM

## 2022-10-26 LAB — VITAMIN D 25 HYDROXY (VIT D DEFICIENCY, FRACTURES): VITD: 96.37 ng/mL (ref 30.00–100.00)

## 2022-10-27 ENCOUNTER — Encounter: Payer: Self-pay | Admitting: Family Medicine

## 2022-10-27 ENCOUNTER — Telehealth: Payer: Self-pay

## 2022-10-27 ENCOUNTER — Other Ambulatory Visit: Payer: Self-pay

## 2022-10-27 DIAGNOSIS — E673 Hypervitaminosis D: Secondary | ICD-10-CM

## 2022-10-27 NOTE — Telephone Encounter (Signed)
Informed pt of lab results  

## 2022-10-27 NOTE — Telephone Encounter (Signed)
-----   Message from Midge Minium, MD sent at 10/27/2022  7:47 AM EST ----- Vit D is now normal.  This is good news.  Please continue to hold calcium and Vit D as it is still at the upper level of normal

## 2022-11-09 ENCOUNTER — Encounter: Payer: Self-pay | Admitting: Family Medicine

## 2022-11-09 ENCOUNTER — Other Ambulatory Visit (INDEPENDENT_AMBULATORY_CARE_PROVIDER_SITE_OTHER): Payer: Medicare HMO

## 2022-11-09 DIAGNOSIS — E673 Hypervitaminosis D: Secondary | ICD-10-CM | POA: Diagnosis not present

## 2022-11-09 LAB — VITAMIN D 25 HYDROXY (VIT D DEFICIENCY, FRACTURES): VITD: 76.2 ng/mL (ref 30.00–100.00)

## 2022-11-09 NOTE — Telephone Encounter (Signed)
Follow up from labs, no celiac what should I tell her about the vitmain D?

## 2022-12-07 ENCOUNTER — Other Ambulatory Visit: Payer: Self-pay

## 2022-12-07 MED ORDER — ATORVASTATIN CALCIUM 10 MG PO TABS: 10.0000 mg | ORAL_TABLET | ORAL | 1 refills | Status: DC

## 2022-12-11 ENCOUNTER — Encounter: Payer: Self-pay | Admitting: Family Medicine

## 2023-01-01 ENCOUNTER — Encounter: Payer: Self-pay | Admitting: Neurology

## 2023-01-04 ENCOUNTER — Encounter (INDEPENDENT_AMBULATORY_CARE_PROVIDER_SITE_OTHER): Payer: Self-pay

## 2023-01-12 DIAGNOSIS — M25562 Pain in left knee: Secondary | ICD-10-CM | POA: Diagnosis not present

## 2023-01-19 ENCOUNTER — Encounter: Payer: Medicare HMO | Admitting: Adult Health

## 2023-01-20 ENCOUNTER — Encounter: Payer: Self-pay | Admitting: Neurology

## 2023-01-20 ENCOUNTER — Ambulatory Visit: Payer: Medicare Other | Admitting: Neurology

## 2023-01-20 VITALS — BP 118/68 | HR 65 | Ht 65.0 in | Wt 187.0 lb

## 2023-01-20 DIAGNOSIS — G4733 Obstructive sleep apnea (adult) (pediatric): Secondary | ICD-10-CM

## 2023-01-20 DIAGNOSIS — G500 Trigeminal neuralgia: Secondary | ICD-10-CM | POA: Diagnosis not present

## 2023-01-20 DIAGNOSIS — H532 Diplopia: Secondary | ICD-10-CM

## 2023-01-20 DIAGNOSIS — H5509 Other forms of nystagmus: Secondary | ICD-10-CM

## 2023-01-20 MED ORDER — PYRIDOSTIGMINE BROMIDE 60 MG PO TABS: 60.0000 mg | ORAL_TABLET | Freq: Three times a day (TID) | ORAL | 0 refills | Status: DC

## 2023-01-20 NOTE — Progress Notes (Signed)
Provider:  Larey Seat, MD  Primary Care Physician:  Midge Minium, MD [address] [place] [address]     Referring Provider: Midge Minium, Md [address] [place],   [id number]          Chief Complaint according to patient   Patient presents with:     New Patient (Initial Visit)           HISTORY OF PRESENT ILLNESS:  Laura Stuart is a 77 y.o. female patient who is here for revisit 01/20/2023 for  Nystagmus follow  -up. The patient is established with our sleep medicine clinic on CPAP.   01-20-2023; Chief concern according to patient :  Eye sight is about the same as the previous visit. She had another MRI completed. She saw a new ophthalmologist who observed her nystagmus. She wanted her to keep the f/u today for MRI completed in September. Her eye glasses have now a prism, but acuity was not changed.  DME [facility] and cpap is working well no issues/concern with that.         07-29-2022: RV for Laura Stuart but with a completely new problem: downbeat nystagmus.  I have I have followed never I have followed Laura Stuart as a sleep apnea patient and the patient was trigeminal neuralgia for over a decade or for over a decade, and she has been a compliant CPAP user.  I briefly want to "the download of her recent CPAP use over 30 days she has used the machine 100% of days and 100% over 4 consecutive hours with an average use at time of 8 to 8 hours 3 minutes.  This.  She is using an AutoSet 11 with between 5 and 15 cm centimeter water with 2 cm expiratory pressure relief 3 pressure relief.  Residual AHI is 0.9 95th percentile pressure is 8.3 0.3 cm water.  Based on this we have no worries about sleep apnea sleep apnea today.  Her adverse sleepiness score was endorsed at 0/ 24 points;  her fatigue severity score was endorsed at 9/ 63  her geriatric her geriatric depression scale  was endorsed at 0/ 15 .   Assessment 1. Downbeat  nystagmus  2. Obstructive sleep apnea  3. Trigeminal neuralgia of right side of face s/p craniotomy in August 2020  4. Convergence insufficiency  5. Hypertropia of right eye  6. Exotropia, intermittent   Ophthalmic Plan of Care:  1. Can continue with same Rx  2. Noted manifest downbeat nystagmus on exam today - would order an MRI Brain to rule out any intracranial leson asked to schedule appointment with neurologist for review  3. Disc asymmetry noted on right side - non-contributory to nystagmus or diplopia - has h/o sleep apnea - glaucoma evaluation with primary ophthalmologist to rule out normal-tension glaucoma  The ophthalmology records state a newly discovered downbeat Nystagmus, and the need for a brain MRI with and without contrast,  she reports diplopia, unable to merge the images. Skewed diplopia even with prism glasses.      09-02-2021: MM, NP;     Review of Systems: Out of a complete 14 system review, the patient complains of only the following symptoms, and all other reviewed systems are negative.:  CPAP user.   How likely are you to doze in the following situations: 0 = not likely, 1 = slight chance, 2 = moderate chance, 3 =  high chance   Sitting and Reading? Watching Television? Sitting inactive in a public place (theater or meeting)? As a passenger in a car for an hour without a break? Lying down in the afternoon when circumstances permit? Sitting and talking to someone? Sitting quietly after lunch without alcohol? In a car, while stopped for a few minutes in traffic?   Total = 1/ 24 points   FSS endorsed at 23/ 63 points.   Social History   Socioeconomic History   Marital status: Married    Spouse name: Laura Stuart   Number of children: 2   Years of education: Grad sch.   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   Patient has two adult children.   Patient is retired.   Patient has a Production designer, theatre/television/film.   Patient is right-handed.   Patient drinks 5 cups of caffeine/week, at home drinks decaf coffee.   Social Determinants of Health   Financial Resource Strain: Low Risk  (09/24/2022)   Overall Financial Resource Strain (CARDIA)    Difficulty of Paying Living Expenses: Not hard at all  Food Insecurity: No Food Insecurity (09/24/2022)   Hunger Vital Sign    Worried About Running Out of Food in the Last Year: Never true    Ran Out of Food in the Last Year: Never true  Transportation Needs: No Transportation Needs (09/24/2022)   PRAPARE - Hydrologist (Medical): No    Lack of Transportation (Non-Medical): No  Physical Activity: Insufficiently Active (09/24/2022)   Exercise Vital Sign    Days of Exercise per Week: 3 days    Minutes of Exercise per Session: 30 min  Stress: No Stress Concern Present (09/24/2022)   [place]    Feeling of Stress : Not at all  Social Connections: [place] (09/24/2022)   Social Connection and Isolation Panel [NHANES]    Frequency of Communication with Friends and Family: More than three times a week    Frequency of Social Gatherings with Friends and Family: More than three times a week    Attends Religious Services: More than 4 times per year    Active Member of [employer] or Organizations: Yes    Attends Music therapist: More than 4 times per  year    Marital Status: Married    Family History  Problem Relation Age of Onset   Stroke Mother    Brain cancer Mother    Lung cancer Mother    Heart attack Mother    Emphysema Father    Alzheimer's disease Sister    Dementia Other        evident after a fall with a hip fracture   Colon cancer Neg Hx    Colon polyps Neg Hx    Esophageal cancer Neg Hx  Rectal cancer Neg Hx    Stomach cancer Neg Hx     Past Medical History:  Diagnosis Date   Allergy    Atypical facial pain    Hyperlipidemia 08/24/2017   Neuralgia    Trigeminal   Sleep apnea    using cpap,OSA   Trigeminal neuralgia    PAST HX- HAD CRANIOTOMY 08-14-2019 FIXED THIS     Past Surgical History:  Procedure Laterality Date   COLONOSCOPY     FOOT SURGERY Left 05/07/2017   POLYPECTOMY     ROOT CANAL  06/2013   toes right foot Bilateral 2010   STRAIGHTENED BOTH FEET    TONSILLECTOMY AND ADENOIDECTOMY  1950   torn miniscus  2003   X2   UPPER GASTROINTESTINAL ENDOSCOPY     VAGINAL DELIVERY     X2     Current Outpatient Medications on File Prior to Visit  Medication Sig Dispense Refill   atorvastatin (LIPITOR) 10 MG tablet Take 1 tablet (10 mg total) by mouth 2 (two) times a week. 90 tablet 1   Calcium Carb-Cholecalciferol (CALCIUM 600-D PO) Take 600 mg by mouth in the morning and at bedtime.     cetirizine (ZYRTEC) 10 MG tablet TAKE 1 TABLET BY MOUTH DAILY (Patient taking differently: Take 10 mg by mouth daily as needed for allergies.) 30 tablet 6   fluticasone (FLONASE) 50 MCG/ACT nasal spray INSTILL 2 SPRAYS INTO EACH NOSTRIL DAILY (Patient taking differently: Place 2 sprays into both nostrils daily as needed for allergies or rhinitis.) 16 g 6   glucosamine-chondroitin 500-400 MG tablet Take 1 tablet by mouth 3 (three) times daily.     Vitamin D, Cholecalciferol, 25 MCG (1000 UT) TABS Take 1,000 Units by mouth daily.     No current facility-administered medications on file prior to visit.    Allergies   Allergen Reactions   Penicillins Other (See Comments)    From allergy testing, unsure if allergy exists     DIAGNOSTIC DATA (LABS, IMAGING, TESTING) - I reviewed patient records, labs, notes, testing and imaging myself where available.  MRI FINDINGS: 08-12-2022.    No abnormal lesions are seen on diffusion-weighted views to suggest acute ischemia. The cortical sulci, fissures and cisterns are normal in size and appearance. Lateral, third and fourth ventricle are normal in size and appearance. No extra-axial fluid collections are seen. No evidence of mass effect or midline shift.  Mild periventricular and subcortical chronic small vessel ischemic disease. No abnormal lesions are seen on post contrast views.     On sagittal views the posterior fossa, pituitary gland and corpus callosum are unremarkable. No evidence of intracranial hemorrhage on SWI views. The orbits and their contents, paranasal sinuses and calvarium are unremarkable.  Intracranial flow voids are present.     IMPRESSION:    MRI brain (with and without) demonstrating: - Mild chronic small vessel ischemic disease, slightly increased from 2018. - No acute findings.       INTERPRETING PHYSICIAN:  Penni Bombard, MD   [facility] 08-13-2022: high Magnesium and High Phosphorus -  ordered by ophthalmology.   Lab Results  Component Value Date   WBC 5.8 10/09/2022   HGB 13.6 10/09/2022   HCT 40.5 10/09/2022   MCV 88.7 10/09/2022   PLT 263.0 10/09/2022      Component Value Date/Time   NA 141 10/09/2022 0924   NA 143 07/29/2022 1518   K 4.5 Hemolysis seen... 10/09/2022 0924   CL 105 10/09/2022 [id number]  CO2 32 10/09/2022 0924   GLUCOSE 89 10/09/2022 0924   BUN 19 10/09/2022 0924   BUN 24 07/29/2022 1518   CREATININE 0.73 10/09/2022 0924   CREATININE 0.79 02/05/2013 1117   CALCIUM 9.7 10/09/2022 0924   PROT 6.8 10/09/2022 0924   PROT 6.9 07/29/2022 1518   ALBUMIN 4.2 10/09/2022 0924   ALBUMIN 4.4 07/29/2022 1518    AST 30 10/09/2022 0924   ALT 18 10/09/2022 0924   ALKPHOS 71 10/09/2022 0924   BILITOT 0.4 10/09/2022 0924   BILITOT 0.2 07/29/2022 1518   GFRNONAA 79 04/11/2019 0925   GFRAA 91 04/11/2019 0925   Lab Results  Component Value Date   CHOL 187 10/09/2022   HDL 59.60 10/09/2022   LDLCALC 95 10/09/2022   LDLDIRECT 106.9 05/22/2013   TRIG 163.0 (H) 10/09/2022   CHOLHDL 3 10/09/2022   Lab Results  Component Value Date   HGBA1C 5.5 02/27/2017   No results found for: "VITAMINB12" Lab Results  Component Value Date   TSH 3.29 10/09/2022    PHYSICAL EXAM:  Today's Vitals   01/20/23 1039  BP: 118/68  Pulse: 65  Weight: 187 lb (84.8 kg)  Height: 5' 5"$  (1.651 m)   Body mass index is 31.12 kg/m.   Wt Readings from Last 3 Encounters:  01/20/23 187 lb (84.8 kg)  10/09/22 184 lb (83.5 kg)  09/24/22 180 lb (81.6 kg)     Ht Readings from Last 3 Encounters:  01/20/23 5' 5"$  (1.651 m)  10/09/22 5' 5"$  (1.651 m)  09/24/22 5' 5"$  (1.651 m)      General: The patient is awake, alert and appears not in acute distress. The patient is well groomed. Head: Normocephalic, atraumatic. Neck is supple.  Cardiovascular:  Regular rate and cardiac rhythm by pulse,  without distended neck veins. Respiratory: Lungs are clear to auscultation.  Skin:  Without evidence of ankle edema, or rash. Trunk: BMI 31   NEUROLOGIC EXAM: The patient is awake and alert, oriented to place and time.   Memory subjective described as intact.  Attention span & concentration ability appears normal.  Speech is fluent,  without  dysarthria, dysphonia or aphasia.  Mood and affect are appropriate.   Cranial nerves: no loss of smell or taste reported  Pupils are equal and briskly reactive to light. Funduscopic exam : there is downbeat nystagmus with gaze to the right more than to the left,  upward gaze makes downbeat nystagmus more visible . The fast component is downwards. Diplopia.  She has strabism- (!)  .    Hearing was intact to soft voice and finger rubbing.    Facial sensation affected by trigeminal neuralgia procedure.   Facial motor strength is symmetric and tongue and uvula move midline.  Neck ROM : rotation, tilt and flexion extension were normal for age and shoulder shrug was symmetrical.    Motor exam:  Symmetric bulk, tone and ROM.   Normal tone without cog wheeling, symmetric grip strength .      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 Changed to Neurontin/ gabapentin and for a while this got better.  Slowly started again in 2023. Prism eye glasses, 3 attempts did not fit her needs. Negative for ach receptor AB. 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 I will order a trial of Mestinon po, 60 mg tid- if this helps her eye sight, I  will adjust the diagnosis to a myasthenic syndrome.   2) downbeat nystagmus - worse when looking upwards or to the right side, both eyes have this nystagmus. No abnormality on MRI brain.   3) trigeminal Neuralgia ,  tegretol until 2020,  gabapentin and gamma knife referral in the same year.  She is pain- symptom free after vascular decompression procedure right face.  Left with right facial numbness and taste is affected. She no longer requires medication for treatment of this condition.   4) OSA on CPAP; the patient is a very compliant CPAP user 100% by days and hours with an average of 8 hours 11 minutes, her AutoSet varies between 5 and 15 cm water pressure with 2 cm expiratory relief.  Her residual AHI is 1.2/h which is excellent, she has low to moderate air leakage, 95th percentile pressure is 8.7 cm water and no central apneas are emerging.  This CPAP machine is relatively new it was issued last year. She is no longer using a so-clean machine.    I plan to follow up either personally or through our NP within 12 months. I have  unfortunately no explanation or the downward nystagmus , except that it may be part of a skewed diplopia compensation mechanism.   I would like to thank Midge Minium, MD and Midge Minium, Md [address] [place],  [place] [address] for allowing me to meet with and to take care of this pleasant patient.   CC: I will share my notes with Dr. Stevan Born .  After spending a total time of  37  minutes face to face and additional time for physical and neurologic examination, review of laboratory studies,  personal review of imaging studies, reports and results of other testing and review of referral information / records as far as provided in visit,   Electronically signed by: Larey Seat, MD 01/20/2023 11:12 AM  [facility] [place] certified by The [employer] of Sleep Medicine and Diplomate of the [employer] of Sleep Medicine. Board certified In Neurology through the [place], Fellow of the [employer] of Neurology. Medical Director of [employer].

## 2023-01-20 NOTE — Patient Instructions (Signed)
Pyridostigmine Tablets What is this medication? PYRIDOSTIGMINE (peer id oh STIG meen) treats myasthenia gravis, a condition that causes muscles to easily weaken or fatigue. It works by decreasing muscle weakness and loss of movement. This medicine may be used for other purposes; ask your health care provider or pharmacist if you have questions. COMMON BRAND NAME(S): Mestinon What should I tell my care team before I take this medication? They need to know if you have any of these conditions: Asthma Difficulty passing urine Heart disease Infection in abdomen, peritonitis Irregular, slow heartbeat Kidney disease Seizures Stomach or bowel obstruction or ulcers Thyroid disease An unusual or allergic reaction to pyridostigmine, bromides, other medications, foods, dyes, or preservatives Pregnant or trying to get pregnant Breast-feeding How should I use this medication? Take this medication by mouth with a glass of water. Take it as directed on the prescription label. Keep taking it unless your care team tells you to stop. Talk to your care team about the use of this medication in children. Special care may be needed. Overdosage: If you think you have taken too much of this medicine contact a poison control center or emergency room at once. NOTE: This medicine is only for you. Do not share this medicine with others. What if I miss a dose? If you miss a dose, take it as soon as you can. If it is almost time for your next dose, take only that dose. Do not take double or extra doses. What may interact with this medication? Do not take this medication with any of the following: Other medications for myasthenia gravis like neostigmine Quinine This medication may also interact with the following: Atropine Bethanechol Disopyramide Edrophonium Guanadrel Guanethidine Mecamylamine Medications that block muscle or nerve pain This list may not describe all possible interactions. Give your  health care provider a list of all the medicines, herbs, non-prescription drugs, or dietary supplements you use. Also tell them if you smoke, drink alcohol, or use illegal drugs. Some items may interact with your medicine. What should I watch for while using this medication? Visit your care team for regular checks on your progress. Tell your care team if your symptoms do not start to get better or if they get worse. Wear a medical ID bracelet or chain. Carry a card that describes your condition. List the medications and doses you take on the card. What side effects may I notice from receiving this medication? Side effects that you should report to your care team as soon as possible: Allergic reactions--skin rash, itching, hives, swelling of the face, lips, tongue, or throat Side effects that usually do not require medical attention (report to your care team if they continue or are bothersome): Diarrhea Excessive sweating Muscle pain or cramps Muscle weakness Nausea Stomach pain This list may not describe all possible side effects. Call your doctor for medical advice about side effects. You may report side effects to FDA at 1-800-FDA-1088. Where should I keep my medication? Keep out of the reach of children and pets. Store between 15 and 30 degrees C (59 and 86 degrees F). Protect from moisture. Keep the container tightly closed. Get rid of any unused medication after the expiration date. To get rid of medications that are no longer needed or have expired: Take the medication to a medication take-back program. Check with your pharmacy or law enforcement to find a location. If you cannot return the medication, check the label or package insert to see if the medication should  be thrown out in the garbage or flushed down the toilet. If you are not sure, ask your care team. If it is safe to put it in the trash, pour the medication out of the container. Mix the medication with cat litter, dirt, coffee  grounds, or other unwanted substance. Seal the mixture in a bag or container. Put it in the trash. NOTE: This sheet is a summary. It may not cover all possible information. If you have questions about this medicine, talk to your doctor, pharmacist, or health care provider.  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 Changed to Neurontin/ gabapentin and for a while this got better.  Slowly started again in 2023. Prism eye glasses, 3 attempts did not fit her needs. Negative for ach receptor AB. 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 I will order a trial of Mestinon po, 60 mg tid- if this helps her eye sight, I will adjust the diagnosis to a myasthenic syndrome.   2) downbeat nystagmus - worse when looking upwards or to the right side, both eyes have this nystagmus. No abnormality on MRI brain.   3) trigeminal Neuralgia ,  tegretol until 2020,  gabapentin and gamma knife referral in the same year.  She is pain- symptom free after vascular decompression procedure right face.  Left with right facial numbness and taste is affected. She no longer requires medication for treatment of this condition.   4) OSA on CPAP; the patient is a very compliant CPAP user 100% by days and hours with an average of 8 hours 11 minutes, her AutoSet varies between 5 and 15 cm water pressure with 2 cm expiratory relief.  Her residual AHI is 1.2/h which is excellent, she has low to moderate air leakage, 95th percentile pressure is 8.7 cm water and no central apneas are emerging.  This CPAP machine is relatively new it was issued last year. She is no longer using a so-clean machine.    I plan to follow up either personally or through our NP within 12 months. I have unfortunately no explanation or the downward nystagmus , except that it may be part of a skewed  diplopia compensation mechanism.   I would like to thank Midge Minium, MD and Midge Minium, Md [address] [place],  [place] [address] for allowing me to meet with and to take care of this pleasant patient.

## 2023-02-08 ENCOUNTER — Encounter (INDEPENDENT_AMBULATORY_CARE_PROVIDER_SITE_OTHER): Payer: Self-pay

## 2023-02-09 DIAGNOSIS — H2513 Age-related nuclear cataract, bilateral: Secondary | ICD-10-CM | POA: Diagnosis not present

## 2023-02-09 DIAGNOSIS — H5310 Unspecified subjective visual disturbances: Secondary | ICD-10-CM | POA: Diagnosis not present

## 2023-02-18 DIAGNOSIS — C44319 Basal cell carcinoma of skin of other parts of face: Secondary | ICD-10-CM | POA: Diagnosis not present

## 2023-02-18 DIAGNOSIS — C44309 Unspecified malignant neoplasm of skin of other parts of face: Secondary | ICD-10-CM | POA: Diagnosis not present

## 2023-03-16 ENCOUNTER — Other Ambulatory Visit: Payer: Self-pay | Admitting: Family Medicine

## 2023-03-16 NOTE — Telephone Encounter (Signed)
Patient is requesting a refill of the following medications: Requested Prescriptions   Pending Prescriptions Disp Refills   zolpidem (AMBIEN) 10 MG tablet [Pharmacy Med Name: zolpidem 10 mg tablet] 30 tablet 3    Sig: TAKE 1 TABLET BY MOUTH EACH NIGHT AT BEDTIME AS NEEDED FOR SLEEP    Date of patient request: 03/16/2023 Last office visit: 10/09/2022 Date of last refill: 04/20/2022 Last refill amount: 30x3 Follow up time period per chart: 1 year

## 2023-03-17 DIAGNOSIS — H5021 Vertical strabismus, right eye: Secondary | ICD-10-CM | POA: Insufficient documentation

## 2023-03-17 DIAGNOSIS — H532 Diplopia: Secondary | ICD-10-CM | POA: Diagnosis not present

## 2023-03-17 DIAGNOSIS — H5030 Unspecified intermittent heterotropia: Secondary | ICD-10-CM | POA: Diagnosis not present

## 2023-03-17 DIAGNOSIS — H5509 Other forms of nystagmus: Secondary | ICD-10-CM | POA: Diagnosis not present

## 2023-03-25 DIAGNOSIS — C44311 Basal cell carcinoma of skin of nose: Secondary | ICD-10-CM | POA: Diagnosis not present

## 2023-04-13 DIAGNOSIS — K080 Exfoliation of teeth due to systemic causes: Secondary | ICD-10-CM | POA: Diagnosis not present

## 2023-04-15 DIAGNOSIS — C44399 Other specified malignant neoplasm of skin of other parts of face: Secondary | ICD-10-CM | POA: Diagnosis not present

## 2023-05-03 ENCOUNTER — Other Ambulatory Visit: Payer: Self-pay | Admitting: Family Medicine

## 2023-06-10 ENCOUNTER — Ambulatory Visit (INDEPENDENT_AMBULATORY_CARE_PROVIDER_SITE_OTHER): Payer: Medicare Other | Admitting: *Deleted

## 2023-06-10 DIAGNOSIS — Z0000 Encounter for general adult medical examination without abnormal findings: Secondary | ICD-10-CM

## 2023-06-10 NOTE — Progress Notes (Signed)
Subjective:   Laura Stuart is a 77 y.o. female who presents for Medicare Annual (Subsequent) preventive examination.  Visit Complete: Virtual  I connected with  Laura Stuart on 06/10/23 by a audio enabled telemedicine application and verified that I am speaking with the correct person using two identifiers.  Patient Location: Home  Provider Location: Home Office  I discussed the limitations of evaluation and management by telemedicine. The patient expressed understanding and agreed to proceed.   Review of Systems     Cardiac Risk Factors include: advanced age (>53men, >32 women)     Objective:    Today's Vitals   There is no height or weight on file to calculate BMI.     06/10/2023    3:26 PM 09/24/2022    9:33 AM 10/29/2021    9:13 AM 09/15/2021   10:34 AM 02/19/2021    2:11 PM 09/09/2020    3:07 PM 08/31/2019    2:54 PM  Advanced Directives  Does Patient Have a Medical Advance Directive? Yes Yes Yes Yes Yes Yes Yes  Type of Estate agent of [employer] Power of [place];Living will Healthcare Power of [place];Living will Healthcare Power of [employer] of [place];Living will Healthcare Power of [place];Living will Healthcare Power of [place];Living will  Does patient want to make changes to medical advance directive?  No - Patient declined No - Patient declined   Yes (MAU/Ambulatory/Procedural Areas - Information given) Yes (MAU/Ambulatory/Procedural Areas - Information given)  Copy of Healthcare Power of Attorney in Chart? Yes - validated most recent copy scanned in chart (See row information) Yes - validated most recent copy scanned in chart (See row information) Yes - validated most recent copy scanned in chart (See row information) No - copy requested  No - copy requested No - copy requested    Current Medications (verified) Outpatient Encounter Medications as of 06/10/2023  Medication Sig   atorvastatin  (LIPITOR) 10 MG tablet Take 1 tablet (10 mg total) by mouth 2 (two) times a week.   Calcium Carb-Cholecalciferol (CALCIUM 600-D PO) Take 600 mg by mouth in the morning and at bedtime.   cetirizine (ZYRTEC) 10 MG tablet TAKE 1 TABLET BY MOUTH DAILY (Patient taking differently: Take 10 mg by mouth daily as needed for allergies.)   fluticasone (FLONASE) 50 MCG/ACT nasal spray INSTILL 2 SPRAYS INTO EACH NOSTRIL DAILY   glucosamine-chondroitin 500-400 MG tablet Take 1 tablet by mouth 3 (three) times daily.   Vitamin D, Cholecalciferol, 25 MCG (1000 UT) TABS Take 1,000 Units by mouth daily.   zolpidem (AMBIEN) 10 MG tablet TAKE 1 TABLET BY MOUTH EACH NIGHT AT BEDTIME AS NEEDED FOR SLEEP   pyridostigmine (MESTINON) 60 MG tablet Take 1 tablet (60 mg total) by mouth 3 (three) times daily. (Patient not taking: Reported on 06/10/2023)   No facility-administered encounter medications on file as of 06/10/2023.    Allergies (verified) Penicillins   History: Past Medical History:  Diagnosis Date   Allergy    Atypical facial pain    Hyperlipidemia 08/24/2017   Neuralgia    Trigeminal   Sleep apnea    using cpap,OSA   Trigeminal neuralgia    PAST HX- HAD CRANIOTOMY 08-14-2019 FIXED THIS    Past Surgical History:  Procedure Laterality Date   COLONOSCOPY     FOOT SURGERY Left 05/07/2017   POLYPECTOMY     ROOT CANAL  06/2013   toes right foot Bilateral 2010   STRAIGHTENED BOTH FEET  TONSILLECTOMY AND ADENOIDECTOMY  1950   torn miniscus  2003   X2   UPPER GASTROINTESTINAL ENDOSCOPY     VAGINAL DELIVERY     X2   Family History  Problem Relation Age of Onset   Stroke Mother    Brain cancer Mother    Lung cancer Mother    Heart attack Mother    Emphysema Father    Alzheimer's disease Sister    Dementia Other        evident after a fall with a hip fracture   Colon cancer Neg Hx    Colon polyps Neg Hx    Esophageal cancer Neg Hx    Rectal cancer Neg Hx    Stomach cancer Neg Hx    Social  History   Socioeconomic History   Marital status: Married    Spouse name: Alain Honey   Number of children: 2   Years of education: Grad sch.   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   Patient has two adult children.   Patient is retired.   Patient has a Environmental manager.   Patient is right-handed.   Patient drinks 5 cups of caffeine/week, at home drinks decaf coffee.   Social Determinants of Health   Financial Resource Strain: Low Risk  (06/10/2023)   Overall Financial Resource Strain (CARDIA)    Difficulty of Paying Living Expenses: Not hard at all  Food Insecurity: No Food Insecurity (06/10/2023)   Hunger Vital Sign    Worried About Running Out of Food in the Last Year: Never true    Ran Out of Food in the Last Year: Never true  Transportation Needs: No Transportation Needs (06/10/2023)   PRAPARE - Administrator, Civil Service (Medical): No    Lack of Transportation (Non-Medical): No  Physical Activity: Insufficiently Active (06/10/2023)   Exercise Vital Sign    Days of Exercise per Week: 2 days    Minutes of Exercise per Session: 20 min  Stress: No Stress Concern Present (06/10/2023)   [employer] of Occupational Health - Occupational Stress Questionnaire    Feeling of Stress : Not at all  Social Connections: Socially Integrated (06/10/2023)   Social Connection and Isolation Panel [NHANES]    Frequency of Communication  with Friends and Family: More than three times a week    Frequency of Social Gatherings with Friends and Family: More than three times a week    Attends Religious Services: More than 4 times per year    Active Member of [employer] or Organizations: Yes    Attends Engineer, structural: More than 4 times per year    Marital Status: Married    Tobacco Counseling Counseling given: Not Answered   Clinical Intake:  Pre-visit preparation completed: Yes  Pain : No/denies pain     Diabetes: No  How often  do you need to have someone help you when you read instructions, pamphlets, or other written materials from your doctor or pharmacy?: 1 - Never  Interpreter Needed?: No  Information entered by :: Remi Haggard LPN   Activities of Daily Living    06/10/2023    3:37 PM 10/09/2022    9:00 AM  In your present state of health, do you have any difficulty performing the following activities:  Hearing? 0 0  Vision? 0 0  Difficulty concentrating or making decisions? 0 0  Walking or climbing stairs? 0   Dressing or bathing? 0 0  Doing errands, shopping? 0 0  Preparing Food and eating ? N   Using the Toilet? N   In the past six months, have you accidently leaked urine? N   Do you have problems with loss of bowel control? N   Managing your Medications? N   Managing your Finances? N   Housekeeping or managing your Housekeeping? N     Patient Care Team: Sheliah Hatch, MD as PCP - General (Family Medicine) Dohmeier, Porfirio Mylar, MD as Consulting Physician (Neurology) Hilarie Fredrickson, MD as Consulting Physician (Gastroenterology) Elinor Parkinson, DPM as Consulting Physician (Podiatry) Geralyn Flash, MD  Indicate any recent Medical Services you may have received from other than [facility] providers in the past year (date may be approximate).     Assessment:   This is a routine wellness examination for Laura Stuart.  Hearing/Vision screen Hearing Screening - Comments:: No trouble  hearing Vision Screening - Comments:: Up to date [facility]  Dietary issues and exercise activities discussed:     Goals Addressed             This Visit's Progress    Increase physical activity         Depression Screen    06/10/2023    3:34 PM 10/09/2022    8:59 AM 09/24/2022    9:31 AM 10/29/2021    9:13 AM 10/08/2021    9:03 AM 09/15/2021   10:54 AM 04/03/2021   10:37 AM  PHQ 2/9 Scores  PHQ - 2 Score 0 1 0 0 0 0 0  PHQ- 9 Score 0 1   1  0    Fall Risk    06/10/2023    3:35 PM 06/10/2023    3:31 PM 10/09/2022    8:59 AM 09/24/2022    9:29 AM 10/29/2021    9:12 AM  Fall Risk   Falls in the past year? 0 0 0 0 0  Number falls in past yr: 0 0  0 0  Injury with Fall? 0 0  0 0  Risk for fall due to :   No Fall Risks No Fall Risks No Fall Risks  Follow up Falls evaluation completed;Education provided;Falls prevention discussed Falls evaluation completed;Education provided;Falls prevention discussed Falls evaluation completed Falls prevention discussed Education provided;Falls prevention discussed    MEDICARE RISK AT HOME:  Medicare Risk at Home - 06/10/23 1536     Any stairs in or around the home? Yes    If so, are there any without handrails? No    Home free of loose throw rugs in walkways, pet beds, electrical cords, etc? Yes    Adequate lighting in your home to reduce risk of falls? Yes    Life alert? No    Use of a cane, walker or w/c? No    Grab bars in the bathroom? No  Shower chair or bench in shower? Yes    Elevated toilet seat or a handicapped toilet? No             TIMED UP AND GO:  Was the test performed?  No    Cognitive Function:      12/28/2016    8:36 AM  Montreal Cognitive Assessment   Visuospatial/ Executive (0/5) 5  Naming (0/3) 3  Attention: Read list of digits (0/2) 2  Attention: Read list of letters (0/1) 1  Attention: Serial 7 subtraction starting at 100 (0/3) 3  Language: Repeat phrase (0/2) 2  Language  : Fluency (0/1) 1  Abstraction (0/2) 2  Delayed Recall (0/5) 4  Orientation (0/6) 6  Total 29  Adjusted Score (based on education) 29      06/10/2023    3:38 PM 09/24/2022    9:33 AM  6CIT Screen  What Year?  0 points  What month?  0 points  What time? 0 points 0 points  Count back from 20 0 points 0 points  Months in reverse 0 points 0 points  Repeat phrase 0 points 0 points  Total Score  0 points    Immunizations Immunization History  Administered Date(s) Administered   Fluad Quad(high Dose 65+) 08/31/2019, 09/11/2021   Hepatitis A 04/20/2011   Influenza Split 10/29/2011   Influenza, High Dose Seasonal PF 08/24/2017, 08/04/2018, 09/03/2020   Influenza,inj,Quad PF,6+ Mos 08/14/2014, 08/20/2015, 08/06/2016   Influenza-Unspecified 08/15/2022   Moderna SARS-COV2 Booster Vaccination 09/24/2020   Moderna Sars-Covid-2 Vaccination 12/27/2019, 01/25/2020, 03/19/2021, 08/15/2022   PFIZER(Purple Top)SARS-COV-2 Vaccination 08/14/2021   Pneumococcal Conjugate-13 08/20/2015   Pneumococcal Polysaccharide-23 05/22/2013   Respiratory Syncytial Virus Vaccine,Recomb Aduvanted(Arexvy) 08/05/2022   Tdap 08/04/2018   Typhoid Parenteral 04/20/2011   Zoster Recombinant(Shingrix) 03/12/2018, 06/10/2018   Zoster, Live 11/30/2004    TDAP status: Up to date  Flu Vaccine status: Up to date  Pneumococcal vaccine status: Up to date  Covid-19 vaccine status: Information provided on how to obtain vaccines.   Qualifies for Shingles Vaccine? No   Zostavax completed Yes   Shingrix Completed?: Yes  Screening Tests Health Maintenance  Topic Date Due   MAMMOGRAM  06/09/2024 (Originally 10/14/2022)   INFLUENZA VACCINE  07/01/2023   DEXA SCAN  10/28/2023   Medicare Annual Wellness (AWV)  06/09/2024   Colonoscopy  06/28/2025   DTaP/Tdap/Td (2 - Td or Tdap) 08/04/2028   Pneumonia Vaccine 27+ Years old  Completed   Hepatitis C Screening  Completed   Zoster Vaccines- Shingrix  Completed   HPV  VACCINES  Aged Out   COVID-19 Vaccine  Discontinued    Health Maintenance  There are no preventive care reminders to display for this patient.   Colorectal cancer screening: No longer required.   Mammogram status: No longer required due to age.  Bone Density status: Completed 2022. Results reflect: Bone density results: NORMAL. Repeat every 2 years.  Lung Cancer Screening: (Low Dose CT Chest recommended if Age 51-80 years, 20 pack-year currently smoking OR have quit w/in 15years.) does not qualify.   Lung Cancer Screening Referral:   Additional Screening:  Hepatitis C Screening: does not qualify; Completed 2021  Vision Screening: Recommended annual ophthalmology exams for early detection of glaucoma and other disorders of the eye. Is the patient up to date with their annual eye exam?  Yes  Who is the provider or what is the name of the office in which the patient attends annual eye exams? [facility] If pt is  not established with a provider, would they like to be referred to a provider to establish care? No .   Dental Screening: Recommended annual dental exams for proper oral hygiene    Community Resource Referral / Chronic Care Management: CRR required this visit?  No   CCM required this visit?  No     Plan:     I have personally reviewed and noted the following in the patient's chart:   Medical and social history Use of alcohol, tobacco or illicit drugs  Current medications and supplements including opioid prescriptions. Patient is not currently taking opioid prescriptions. Functional ability and status Nutritional status Physical activity Advanced directives List of other physicians Hospitalizations, surgeries, and ER visits in previous 12 months Vitals Screenings to include cognitive, depression, and falls Referrals and appointments  In addition, I have reviewed and discussed with patient certain preventive protocols, quality metrics, and best practice  recommendations. A written personalized care plan for preventive services as well as general preventive health recommendations were provided to patient.     Remi Haggard, LPN   1/61/0960   After Visit Summary: (MyChart) Due to this being a telephonic visit, the after visit summary with patients personalized plan was offered to patient via MyChart   Nurse Notes:

## 2023-06-10 NOTE — Patient Instructions (Signed)
Laura Stuart , Thank you for taking time to come for your Medicare Wellness Visit. I appreciate your ongoing commitment to your health goals. Please review the following plan we discussed and let me know if I can assist you in the future.   Screening recommendations/referrals: Colonoscopy: no longer required Mammogram: no longer required Bone Density: up to date Recommended yearly ophthalmology/optometry visit for glaucoma screening and checkup Recommended yearly dental visit for hygiene and checkup  Vaccinations: Influenza vaccine: up to date Pneumococcal vaccine: up to date Tdap vaccine: up to date Shingles vaccine: up to date    Advanced directives: up to date     Preventive Care 65 Years and Older, Female Preventive care refers to lifestyle choices and visits with your health care provider that can promote health and wellness. What does preventive care include? A yearly physical exam. This is also called an annual well check. Dental exams once or twice a year. Routine eye exams. Ask your health care provider how often you should have your eyes checked. Personal lifestyle choices, including: Daily care of your teeth and gums. Regular physical activity. Eating a healthy diet. Avoiding tobacco and drug use. Limiting alcohol use. Practicing safe sex. Taking low-dose aspirin every day. Taking vitamin and mineral supplements as recommended by your health care provider. What happens during an annual well check? The services and screenings done by your health care provider during your annual well check will depend on your age, overall health, lifestyle risk factors, and family history of disease. Counseling  Your health care provider may ask you questions about your: Alcohol use. Tobacco use. Drug use. Emotional well-being. Home and relationship well-being. Sexual activity. Eating habits. History of falls. Memory and ability to understand (cognition). Work and work  Astronomer. Reproductive health. Screening  You may have the following tests or measurements: Height, weight, and BMI. Blood pressure. Lipid and cholesterol levels. These may be checked every 5 years, or more frequently if you are over 32 years old. Skin check. Lung cancer screening. You may have this screening every year starting at age 49 if you have a 30-pack-year history of smoking and currently smoke or have quit within the past 15 years. Fecal occult blood test (FOBT) of the stool. You may have this test every year starting at age 66. Flexible sigmoidoscopy or colonoscopy. You may have a sigmoidoscopy every 5 years or a colonoscopy every 10 years starting at age 23. Hepatitis C blood test. Hepatitis B blood test. Sexually transmitted disease (STD) testing. Diabetes screening. This is done by checking your blood sugar (glucose) after you have not eaten for a while (fasting). You may have this done every 1-3 years. Bone density scan. This is done to screen for osteoporosis. You may have this done starting at age 17. Mammogram. This may be done every 1-2 years. Talk to your health care provider about how often you should have regular mammograms. Talk with your health care provider about your test results, treatment options, and if necessary, the need for more tests. Vaccines  Your health care provider may recommend certain vaccines, such as: Influenza vaccine. This is recommended every year. Tetanus, diphtheria, and acellular pertussis (Tdap, Td) vaccine. You may need a Td booster every 10 years. Zoster vaccine. You may need this after age 85. Pneumococcal 13-valent conjugate (PCV13) vaccine. One dose is recommended after age 49. Pneumococcal polysaccharide (PPSV23) vaccine. One dose is recommended after age 22. Talk to your health care provider about which screenings and vaccines you need  and how often you need them. This information is not intended to replace advice given to you by  your health care provider. Make sure you discuss any questions you have with your health care provider. Document Released: 12/13/2015 Document Revised: 08/05/2016 Document Reviewed: 09/17/2015 Elsevier Interactive Patient Education  2017 [employer].  Fall Prevention in the Home Falls can cause injuries. They can happen to people of all ages. There are many things you can do to make your home safe and to help prevent falls. What can I do on the outside of my home? Regularly fix the edges of walkways and driveways and fix any cracks. Remove anything that might make you trip as you walk through a door, such as a raised step or threshold. Trim any bushes or trees on the path to your home. Use bright outdoor lighting. Clear any walking paths of anything that might make someone trip, such as rocks or tools. Regularly check to see if handrails are loose or broken. Make sure that both sides of any steps have handrails. Any raised decks and porches should have guardrails on the edges. Have any leaves, snow, or ice cleared regularly. Use sand or salt on walking paths during winter. Clean up any spills in your garage right away. This includes oil or grease spills. What can I do in the bathroom? Use night lights. Install grab bars by the toilet and in the tub and shower. Do not use towel bars as grab bars. Use non-skid mats or decals in the tub or shower. If you need to sit down in the shower, use a plastic, non-slip stool. Keep the floor dry. Clean up any water that spills on the floor as soon as it happens. Remove soap buildup in the tub or shower regularly. Attach bath mats securely with double-sided non-slip rug tape. Do not have throw rugs and other things on the floor that can make you trip. What can I do in the bedroom? Use night lights. Make sure that you have a light by your bed that is easy to reach. Do not use any sheets or blankets that are too big for your bed. They should not hang  down onto the floor. Have a firm chair that has side arms. You can use this for support while you get dressed. Do not have throw rugs and other things on the floor that can make you trip. What can I do in the kitchen? Clean up any spills right away. Avoid walking on wet floors. Keep items that you use a lot in easy-to-reach places. If you need to reach something above you, use a strong step stool that has a grab bar. Keep electrical cords out of the way. Do not use floor polish or wax that makes floors slippery. If you must use wax, use non-skid floor wax. Do not have throw rugs and other things on the floor that can make you trip. What can I do with my stairs? Do not leave any items on the stairs. Make sure that there are handrails on both sides of the stairs and use them. Fix handrails that are broken or loose. Make sure that handrails are as long as the stairways. Check any carpeting to make sure that it is firmly attached to the stairs. Fix any carpet that is loose or worn. Avoid having throw rugs at the top or bottom of the stairs. If you do have throw rugs, attach them to the floor with carpet tape. Make sure that you  have a light switch at the top of the stairs and the bottom of the stairs. If you do not have them, ask someone to add them for you. What else can I do to help prevent falls? Wear shoes that: Do not have high heels. Have rubber bottoms. Are comfortable and fit you well. Are closed at the toe. Do not wear sandals. If you use a stepladder: Make sure that it is fully opened. Do not climb a closed stepladder. Make sure that both sides of the stepladder are locked into place. Ask someone to hold it for you, if possible. Clearly mark and make sure that you can see: Any grab bars or handrails. First and last steps. Where the edge of each step is. Use tools that help you move around (mobility aids) if they are needed. These  include: Canes. Walkers. Scooters. Crutches. Turn on the lights when you go into a dark area. Replace any light bulbs as soon as they burn out. Set up your furniture so you have a clear path. Avoid moving your furniture around. If any of your floors are uneven, fix them. If there are any pets around you, be aware of where they are. Review your medicines with your doctor. Some medicines can make you feel dizzy. This can increase your chance of falling. Ask your doctor what other things that you can do to help prevent falls. This information is not intended to replace advice given to you by your health care provider. Make sure you discuss any questions you have with your health care provider. Document Released: 09/12/2009 Document Revised: 04/23/2016 Document Reviewed: 12/21/2014 Elsevier Interactive Patient Education  2017 [employer].

## 2023-08-05 ENCOUNTER — Telehealth: Payer: Self-pay | Admitting: Family Medicine

## 2023-08-05 ENCOUNTER — Encounter: Payer: Self-pay | Admitting: Family Medicine

## 2023-08-05 ENCOUNTER — Telehealth: Payer: Self-pay | Admitting: Internal Medicine

## 2023-08-05 ENCOUNTER — Ambulatory Visit: Payer: Medicare Other

## 2023-08-05 ENCOUNTER — Telehealth (INDEPENDENT_AMBULATORY_CARE_PROVIDER_SITE_OTHER): Payer: Medicare Other | Admitting: Family Medicine

## 2023-08-05 DIAGNOSIS — U071 COVID-19: Secondary | ICD-10-CM

## 2023-08-05 MED ORDER — NIRMATRELVIR/RITONAVIR (PAXLOVID)TABLET: 3.0000 | ORAL_TABLET | Freq: Two times a day (BID) | ORAL | 0 refills | Status: DC

## 2023-08-05 MED ORDER — NIRMATRELVIR/RITONAVIR (PAXLOVID)TABLET: 3.0000 | ORAL_TABLET | Freq: Two times a day (BID) | ORAL | 0 refills | Status: AC

## 2023-08-05 NOTE — Progress Notes (Signed)
Virtual Visit via Video Note  I connected with Laura Stuart on 08/05/23 at 10:23 by a video enabled telemedicine application and verified that I am speaking with the correct person using two identifiers.   Patient Location: Home Provider Location: office - [facility].    I discussed the limitations, risks, security and privacy concerns of performing an evaluation and management service by telephone and the availability of in person appointments. I also discussed with the patient that there may be a patient responsible charge related to this service. The patient expressed understanding and agreed to proceed, consent obtained  Chief Complaint  Patient presents with   Covid Positive    Pt reports Covid yesterday SX fever, nasal drainage . OTC Tylenol Pt reports unable to check vitals at home    History of Present Illness: Laura Stuart is a 77 y.o. female tested positive covid at home.   +fever +nasal drainage/congestion +sinus congestion +fatigue   -sore throat -ear ache or drainage -headache -SHOB -Cough -Chest pain  -Nausea/vomiting   Symptoms started yesterday.  Patient has took OTC Tylenol and has helped some.  Recent travel to [place], [place] for a wedding.   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   Sleep apnea with use of continuous positive airway pressure (CPAP) 07/26/2013   Obesity (BMI 30-39.9) 07/07/2011   Osteopenia 07/07/2011   Routine general medical examination at a health care facility 07/07/2011   Past Medical History:  Diagnosis Date   Allergy    Atypical facial pain     Hyperlipidemia 08/24/2017   Neuralgia    Trigeminal   Sleep apnea    using cpap,OSA   Trigeminal neuralgia    PAST HX- HAD CRANIOTOMY 08-14-2019 FIXED THIS    Past Surgical History:  Procedure Laterality Date   COLONOSCOPY     FOOT SURGERY Left 05/07/2017   POLYPECTOMY     ROOT CANAL  06/2013   toes right foot Bilateral 2010   STRAIGHTENED BOTH FEET    TONSILLECTOMY AND ADENOIDECTOMY  1950   torn miniscus  2003   X2   UPPER GASTROINTESTINAL ENDOSCOPY     VAGINAL DELIVERY     X2   Allergies  Allergen Reactions   Penicillins Other (See Comments)    From allergy testing, unsure if allergy exists   Prior to Admission medications   Medication Sig Start Date End Date Taking? Authorizing Provider  atorvastatin (LIPITOR) 10 MG tablet Take 1 tablet (10 mg total) by mouth 2 (two) times a week. 12/07/22  Yes Sheliah Hatch, MD  Calcium Carb-Cholecalciferol (CALCIUM 600-D PO) Take 600 mg by mouth in the morning and at bedtime.   Yes [provider]  cetirizine (ZYRTEC) 10 MG tablet TAKE 1 TABLET BY MOUTH DAILY Patient taking differently: Take 10 mg by mouth daily as needed for allergies. 04/14/21  Yes Sheliah Hatch, MD  fluticasone [facility]) 50 MCG/ACT nasal spray INSTILL 2 SPRAYS INTO EACH NOSTRIL DAILY 05/03/23  Yes Sheliah Hatch, MD  glucosamine-chondroitin 500-400 MG tablet Take 1 tablet by mouth 3 (three) times daily.   Yes [provider]  Vitamin D, Cholecalciferol, 25  MCG (1000 UT) TABS Take 1,000 Units by mouth daily.   Yes [provider]  zolpidem (AMBIEN) 10 MG tablet TAKE 1 TABLET BY MOUTH EACH NIGHT AT BEDTIME AS NEEDED FOR SLEEP 03/16/23  Yes Sheliah Hatch, MD  pyridostigmine (MESTINON) 60 MG tablet Take 1 tablet (60 mg total) by mouth 3 (three) times daily. Patient not taking: Reported on 06/10/2023 01/20/23   Dohmeier, Porfirio Mylar, MD   Social History   Socioeconomic History   Marital status: Married    Spouse name: Alain Honey    Number of children: 2   Years of education: Grad sch.   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   Patient has two adult children.   Patient is retired.   Patient has a Environmental manager.   Patient is right-handed.   Patient drinks 5 cups of caffeine/week, at home drinks decaf coffee.   Social Determinants of Health   Financial Resource Strain: Low Risk  (06/10/2023)   Overall Financial Resource Strain (CARDIA)    Difficulty of Paying Living Expenses: Not hard at all  Food Insecurity: No Food Insecurity (06/10/2023)   Hunger Vital Sign    Worried About Running Out of Food in the Last Year: Never true    Ran Out of Food in the Last Year: Never true  Transportation Needs: No Transportation Needs (06/10/2023)   PRAPARE - Administrator, Civil Service (Medical): No    Lack of Transportation (Non-Medical): No  Physical Activity: Insufficiently Active (06/10/2023)   Exercise Vital Sign    Days of Exercise per Week: 2 days    Minutes of Exercise per Session: 20 min  Stress: No Stress Concern Present (06/10/2023)   [employer] of Occupational Health - Occupational Stress Questionnaire    Feeling of Stress : Not at all  Social Connections: Socially Integrated (06/10/2023)   Social Connection and Isolation Panel [NHANES]    Frequency of Communication with Friends and Family: More than three times a week    Frequency of Social Gatherings with  Friends and Family: More than three times a week    Attends Religious Services: More than 4 times per year    Active Member of [employer] or Organizations: Yes    Attends Banker Meetings: More than 4 times per year    Marital Status: Married  Catering manager Violence: Not At Risk (06/10/2023)   Humiliation, Afraid, Rape, and Kick questionnaire    Fear of Current or Ex-Partner: No    Emotionally Abused: No    Physically Abused: No    Sexually Abused: No    Observations/Objective: There were no vitals filed for this visit. Physical Exam Vitals reviewed.  Constitutional:      General: She is not in acute distress.    Appearance: Normal appearance. She is ill-appearing (Mild). She is not toxic-appearing or  diaphoretic.  Eyes:     General:        Right eye: No discharge.        Left eye: No discharge.     Conjunctiva/sclera: Conjunctivae normal.     Comments: Wearing glasses   Cardiovascular:     Rate and Rhythm: Normal rate.  Pulmonary:     Effort: Pulmonary effort is normal. No respiratory distress.  Skin:    General: Skin is dry.  Neurological:     General: No focal deficit present.     Mental Status: She is alert and oriented to person, place, and time. Mental status is at baseline.  Psychiatric:        Mood and Affect: Mood normal.        Behavior: Behavior normal.        Thought Content: Thought content normal.        Judgment: Judgment normal.    Assessment and Plan: COVID -     nirmatrelvir/ritonavir; Take 3 tablets by mouth 2 (two) times daily for 5 days. (Take nirmatrelvir 150 mg two tablets twice daily for 5 days and ritonavir 100 mg one tablet twice daily for 5 days) Patient GFR is 79.87.  Dispense: 30 tablet; Refill: 0  -Prescribed Paxlovid for covid. Advised to not take Atorvastatin 10mg  for 2 weeks.  -Discussed about supportive care. May take OTC Tylenol, Ibuprofen, and Mucinex as directed.  -Rest, hydrate  -Discussed covid quarantine guidelines.   -Discussed red flags of needed to go to the emergency department. -Discussed about taking Vitamin C 1,000mg  and Zinc 50-100mg  daily for 30 days to support immune symptoms.   Follow Up Instructions: If not improved.    I discussed the assessment and treatment plan with the patient. The patient was provided an opportunity to ask questions and all were answered. The patient agreed with the plan and demonstrated an understanding of the instructions.   The patient was advised to call back or seek an in-person evaluation if the symptoms worsen or if the condition fails to improve as anticipated.  Zandra Abts, NP

## 2023-08-05 NOTE — Telephone Encounter (Signed)
Received call from call service.  Pt was seen today - diagnosed with covid.  Treated with paxlovid.  Rx was sent in to [facility]. Pharmacy does not have medication.  [facility] [address]  [facility]  Reviewed note.  Pt has already been informed to hold atorvastatin.

## 2023-08-06 ENCOUNTER — Telehealth: Payer: Self-pay | Admitting: Family Medicine

## 2023-08-06 ENCOUNTER — Telehealth: Payer: Self-pay

## 2023-08-06 ENCOUNTER — Encounter: Payer: Self-pay | Admitting: Family Medicine

## 2023-08-06 ENCOUNTER — Other Ambulatory Visit: Payer: Self-pay | Admitting: Internal Medicine

## 2023-08-06 DIAGNOSIS — U071 COVID-19: Secondary | ICD-10-CM

## 2023-08-06 NOTE — Telephone Encounter (Signed)
Pt is calling back to see if prescription situation has been resolved.

## 2023-08-06 NOTE — Telephone Encounter (Signed)
Contacted pharmacy and clarified standard dosing, and contacted pt let her know it is ready

## 2023-08-06 NOTE — Telephone Encounter (Signed)
Caller name: Haskell Flirt Fauth  On DPR?: Yes  Call back number: [phone] (mobile)  Provider they see: Sheliah Hatch, MD  Reason for call:  Pt called night triage after mychart video 08/05/23 being covid +. The pharmacy that the prescription was originally sent to has Paxlovid on back order. So one of the dr's on night triage sent in another prescription to a pharmacy that has Paxlovid in stock but they cannot read the prescription so they need clarification. [facility] [address] If someone could call and ask for Eliberto Ivory (one of the pharmacists) at [phone].

## 2023-08-06 NOTE — Telephone Encounter (Signed)
Pharmacist stated cannot do a titration dose the full prescription must be same daily dosing, please advise

## 2023-08-06 NOTE — Telephone Encounter (Signed)
This has been addressed via mychart

## 2023-08-06 NOTE — Telephone Encounter (Signed)
Patient Name First: Laura BETH Last: Stuart Gender: Female DOB: [birth year] Age: 77 Y 3 M 8 D Return Phone Number: [phone] (Primary) Address: City/ State/ Zip: [place]  [address] Client [facility] - C Client Site [facility] - Night Provider Lezlie Octave- MD Contact Type Call Who Is Calling Patient / Member / Family / Caregiver Call Type Triage / Clinical Relationship To Patient Self Return Phone Number [phone] (Primary) Chief Complaint Nasal Congestion Reason for Call Symptomatic / Request for Health Information Initial Comment Caller states she had an appt. today. Paxlovid was sent to pharmacy but it is in back order. She is needing to get it sent to another pharmacy. She is Covid positive. Congestion, runny nose. NP Clinton Sawyer virtual visit today. Additional Comment [phone] [facility] at 9 Translation No Nurse Assessment Nurse: Elijah Birk, RN, Vernona Rieger Date/Time Lamount Cohen Time): 08/05/2023 8:25:53 PM Confirm and document reason for call. If symptomatic, describe symptoms. ---Caller states she had a virtual visit today and she is positive for Covid and a Rx for Paxlovid was sent to [facility] and they are out of stock and they were going to be [facility] but they do not [facility] it. Does the patient have any new or worsening symptoms? ---Yes Will a triage be completed? ---Yes Related visit to physician within the last 2 weeks? ---Yes Does the PT have any chronic conditions? (i.e. diabetes, asthma, this includes High risk factors for pregnancy, etc.) ---No Is this a behavioral health or substance abuse call? ---No Guidelines Guideline Title Affirmed Question Affirmed Notes Nurse Date/Time Lamount Cohen Time) Recent Medical Visit for Illness Follow-up Call [1] Caller has URGENT question (includes prescribed Mallie Darting 08/05/2023  8:27:32 PM PLEASE NOTE: All timestamps contained within this report are represented as [place] Standard Time. CONFIDENTIALTY NOTICE: This fax transmission is intended only for the addressee. It contains information that is legally privileged, confidential or otherwise protected from use or disclosure. If you are not the intended recipient, you are strictly prohibited from reviewing, disclosing, copying using or disseminating any of this information or taking any action in reliance on or regarding this information. If you have received this fax in error, please notify us immediately by telephone so that we can arrange for its return to [place]. Phone: [phone], Toll-Free: [phone], Fax: [phone] Page: 2 of 3 Call Id: [id number] Guidelines Guideline Title Affirmed Question Affirmed Notes Nurse Date/Time Lamount Cohen Time) medication questions) AND [2] triager unable to answer question Disp. Time Lamount Cohen Time) Disposition Final User 08/05/2023 7:53:04 PM Send to Clinical Alanda Amass, RN, Doree Fudge 08/05/2023 8:24:17 PM Send To RN Personal Tretha Sciara, RN, Doree Fudge 08/05/2023 8:30:31 PM Called On-Call Provider Elijah Birk, RN, Vernona Rieger 08/05/2023 8:36:35 PM Call PCP Now Yes Elijah Birk, RN, Vernona Rieger Final Disposition 08/05/2023 8:36:35 PM Call PCP Now Yes Elijah Birk, RN, Durenda Guthrie Disagree/Comply Comply Caller Understands Yes PreDisposition Call Doctor Care Advice Given Per Guideline CALL PCP NOW: CALL BACK IF: * You become worse CARE ADVICE given per Recent Medical Visit for Illness: Follow-Up Call (Adult) guideline. Comments User: Beverley Fiedler, RN Date/Time Lamount Cohen Time): 08/05/2023 8:37:14 PM Caller aware that the on call sent the [facility]. Paging DoctorName Phone DateTime Result/ Outcome Message Type Notes Dale Causey - MD [phone] 08/05/2023 8:30:31 PM Called On Call Provider - Reached Doctor Paged Dale [place] - MD 08/05/2023 8:36:08 PM Spoke with On Call - General Message  Result Spoke to the  on call and made aware that the pt was seen today on a virtual visit and that she is positive for Covid and was prescribed Paxlovid and her pharmacy has it on back order and they were supposed to send PLEASE NOTE: All timestamps contained within this report are represented as [place] Standard Time. CONFIDENTIALTY NOTICE: This fax transmission is intended only for the addressee. It contains information that is legally privileged, confidential or otherwise protected from use or disclosure. If you are not the intended recipient, you are strictly prohibited from reviewing, disclosing, copying using or disseminating any of this information or taking any action in reliance on or regarding this information. If you have received this fax in error, please notify us immediately by telephone so that we can arrange for its return to [place]. Phone: [phone], Toll-Free: [phone], Fax: [phone] Page: 3 of 3 Call Id: [id number] Paging DoctorName Phone DateTime Result/ Outcome Message Type [facility] [address] but they do not have it. On call states she will send it in now to that [facility]

## 2023-08-06 NOTE — Telephone Encounter (Signed)
   Pharmacy comment: Script Clarification:PLEASE RECLARIFY DRUG DOSE. RENAL DOSE OR STANDARD DOSE?

## 2023-08-06 NOTE — Telephone Encounter (Signed)
Error

## 2023-08-12 ENCOUNTER — Other Ambulatory Visit: Payer: Self-pay

## 2023-08-12 ENCOUNTER — Emergency Department (HOSPITAL_BASED_OUTPATIENT_CLINIC_OR_DEPARTMENT_OTHER): Payer: Medicare Other | Admitting: Radiology

## 2023-08-12 ENCOUNTER — Emergency Department (HOSPITAL_BASED_OUTPATIENT_CLINIC_OR_DEPARTMENT_OTHER): Payer: Medicare Other

## 2023-08-12 ENCOUNTER — Encounter (HOSPITAL_BASED_OUTPATIENT_CLINIC_OR_DEPARTMENT_OTHER): Payer: Self-pay

## 2023-08-12 ENCOUNTER — Emergency Department (HOSPITAL_BASED_OUTPATIENT_CLINIC_OR_DEPARTMENT_OTHER)
Admission: EM | Admit: 2023-08-12 | Discharge: 2023-08-12 | Disposition: A | Discharge status: Home / Self Care | Payer: Medicare Other | Attending: Emergency Medicine | Admitting: Emergency Medicine

## 2023-08-12 ENCOUNTER — Telehealth: Payer: Self-pay | Admitting: Family Medicine

## 2023-08-12 DIAGNOSIS — I700 Atherosclerosis of aorta: Secondary | ICD-10-CM | POA: Diagnosis not present

## 2023-08-12 DIAGNOSIS — R0600 Dyspnea, unspecified: Secondary | ICD-10-CM | POA: Diagnosis not present

## 2023-08-12 DIAGNOSIS — R0602 Shortness of breath: Secondary | ICD-10-CM | POA: Diagnosis not present

## 2023-08-12 DIAGNOSIS — R7989 Other specified abnormal findings of blood chemistry: Secondary | ICD-10-CM

## 2023-08-12 DIAGNOSIS — R791 Abnormal coagulation profile: Secondary | ICD-10-CM | POA: Insufficient documentation

## 2023-08-12 DIAGNOSIS — R0789 Other chest pain: Secondary | ICD-10-CM | POA: Diagnosis not present

## 2023-08-12 LAB — CBC WITH DIFFERENTIAL/PLATELET
Abs Immature Granulocytes: 0.01 10*3/uL (ref 0.00–0.07)
Basophils Absolute: 0 10*3/uL (ref 0.0–0.1)
Basophils Relative: 0 %
Eosinophils Absolute: 0.1 10*3/uL (ref 0.0–0.5)
Eosinophils Relative: 2 %
HCT: 39.3 % (ref 36.0–46.0)
Hemoglobin: 13.1 g/dL (ref 12.0–15.0)
Immature Granulocytes: 0 %
Lymphocytes Relative: 17 %
Lymphs Abs: 1.4 10*3/uL (ref 0.7–4.0)
MCH: 29.6 pg (ref 26.0–34.0)
MCHC: 33.3 g/dL (ref 30.0–36.0)
MCV: 88.9 fL (ref 80.0–100.0)
Monocytes Absolute: 0.6 10*3/uL (ref 0.1–1.0)
Monocytes Relative: 8 %
Neutro Abs: 5.7 10*3/uL (ref 1.7–7.7)
Neutrophils Relative %: 73 %
Platelets: 234 10*3/uL (ref 150–400)
RBC: 4.42 MIL/uL (ref 3.87–5.11)
RDW: 13.4 % (ref 11.5–15.5)
WBC: 7.9 10*3/uL (ref 4.0–10.5)
nRBC: 0 % (ref 0.0–0.2)

## 2023-08-12 LAB — TROPONIN I (HIGH SENSITIVITY)
Troponin I (High Sensitivity): 5 ng/L (ref <18)
Troponin I (High Sensitivity): 5 ng/L (ref <18)

## 2023-08-12 LAB — COMPREHENSIVE METABOLIC PANEL
ALT: 17 U/L (ref 0–44)
AST: 23 U/L (ref 15–41)
Albumin: 3.9 g/dL (ref 3.5–5.0)
Alkaline Phosphatase: 66 U/L (ref 38–126)
Anion gap: 12 (ref 5–15)
BUN: 22 mg/dL (ref 8–23)
CO2: 27 mmol/L (ref 22–32)
Calcium: 9.2 mg/dL (ref 8.9–10.3)
Chloride: 104 mmol/L (ref 98–111)
Creatinine, Ser: 0.72 mg/dL (ref 0.44–1.00)
GFR, Estimated: >60 mL/min (ref >60)
Glucose, Bld: 97 mg/dL (ref 70–99)
Potassium: 3.6 mmol/L (ref 3.5–5.1)
Sodium: 143 mmol/L (ref 135–145)
Total Bilirubin: 0.3 mg/dL (ref 0.3–1.2)
Total Protein: 6.5 g/dL (ref 6.5–8.1)

## 2023-08-12 LAB — D-DIMER, QUANTITATIVE: D-Dimer, Quant: 2.76 ug{FEU}/mL — ABNORMAL HIGH (ref 0.00–0.50)

## 2023-08-12 MED ORDER — IOHEXOL 300 MG/ML  SOLN
80.0000 mL | Freq: Once | INTRAMUSCULAR | Status: AC | PRN
  Administered 2023-08-12: 80 mL via INTRAVENOUS

## 2023-08-12 NOTE — Telephone Encounter (Signed)
Pt called complaining of shortness of breath, was transferred to triage.

## 2023-08-12 NOTE — Telephone Encounter (Signed)
Patient Name First: Laura [facility] Last: Stuart Gender: Female DOB: 1946/08/28 Age: 77 Y 3 M 14 D Return Phone Number: [phone] (Primary), [phone] (Secondary) Address: City/ State/ Zip: [place]  [address] Client [facility] Day - [place] Client Site [facility] - Day Provider Lezlie Octave- MD Contact Type Call Who Is Calling Patient / Member / Family / Caregiver Call Type Triage / Clinical Caller Name Laura Stuart from [place] Relationship To Patient Other Return Phone Number [phone] (Primary) Chief Complaint CHEST PAIN - pain, pressure, heaviness or tightness Reason for Call Symptomatic / Request for Health Information Initial Comment Caller Vanessa from [place] and patient is post covid, stating tightness in chest and difficult to breathe. Patient said shallow breathing and difficult to get a deep breath. She said it doesnt hurt but its an effort. Translation No Nurse Assessment Nurse: Gasper Sells, RN, Marylu Lund Date/Time Lamount Cohen Time): 08/12/2023 10:19:57 AM Confirm and document reason for call. If symptomatic, describe symptoms. ---[facility] from [place] and patient is post covid, stating tightness in chest and difficult to breathe. Patient said shallow breathing and difficult to get a deep breath. She said it doesn't hurt but its an effort. 9/4 tested +, took Paxlovid. Labored. Trouble taking deep breath. Constant pressure. Does the patient have any new or worsening symptoms? ---Yes Will a triage be completed? ---Yes Related visit to physician within the last 2 weeks? ---Yes Does the PT have any chronic conditions? (i.e. diabetes, asthma, this includes High risk factors for pregnancy, etc.) ---Yes List chronic conditions. ---CPAP, eyes, twice a week, cholesterol meds, Is this a behavioral health or substance abuse call? ---No PLEASE NOTE: All timestamps contained within this report are represented as  [place] Standard Time. CONFIDENTIALTY NOTICE: This fax transmission is intended only for the addressee. It contains information that is legally privileged, confidential or otherwise protected from use or disclosure. If you are not the intended recipient, you are strictly prohibited from reviewing, disclosing, copying using or disseminating any of this information or taking any action in reliance on or regarding this information. If you have received this fax in error, please notify us immediately by telephone so that we can arrange for its return to [place]. Phone: [phone], Toll-Free: [phone], Fax: [phone] Page: 2 of 2 Call Id: [id number] Guidelines Guideline Title Affirmed Question Affirmed Notes Nurse Date/Time Lamount Cohen Time) COVID-19 - Diagnosed or Suspected SEVERE or constant chest pain or pressure (Exception: Mild central chest pain, present only when coughing.) Quentin Cornwall 08/12/2023 10:22:32 AM Disp. Time Lamount Cohen Time) Disposition Final User 08/12/2023 10:17:57 AM Send to Urgent Queue Linna Caprice 08/12/2023 10:26:04 AM Go to ED Now Yes Gasper Sells, RN, Marylu Lund Final Disposition 08/12/2023 10:26:04 AM Go to ED Now Yes Gasper Sells, RN, Herbert Pun Disagree/Comply Comply Caller Understands Yes PreDisposition Call Doctor Care Advice Given Per Guideline GO TO ED NOW: * You need to be seen in the Emergency Department. * Go to the ED at ___________ Hospital. * Leave now. Drive carefully. TELL HEALTHCARE PERSONNEL THAT YOU MIGHT HAVE COVID-19: * Tell the first healthcare worker you meet that you may have COVID-19. WEAR A MASK - COVER YOUR MOUTH AND NOSE: * Wear a mask that fits snuggly over your mouth and nose. CALL 911 IF: * You become worse CARE ADVICE given per COVID-19 - DIAGNOSED OR SUSPECTED (Adult) guideline. ANOTHER ADULT SHOULD DRIVE: * It is better and safer if another adult drives instead of you. Referrals [facility] - ED

## 2023-08-12 NOTE — ED Triage Notes (Signed)
Pt presents with c/o of being unable to take a deep breath. Pt was dx and started having COVID symptoms on 9/4. Pt reports that she got better from the COVID symptoms and then today started having the trouble breathing. Pt did take a course of Paxlovid.

## 2023-08-12 NOTE — ED Provider Notes (Signed)
Received handoff from Felicita Gage PA, pending dc as long as OK CTA.  Physical Exam  BP 128/70 (BP Location: Right Arm)   Pulse 78   Temp 98.3 F (36.8 C) (Oral)   Resp 16   Ht 5\' 5"  (1.651 m)   Wt 81.6 kg   SpO2 100%   BMI 29.95 kg/m   Physical Exam Vitals and nursing note reviewed.  Constitutional:      General: She is not in acute distress.    Appearance: She is well-developed.  HENT:     Head: Normocephalic and atraumatic.  Eyes:     Conjunctiva/sclera: Conjunctivae normal.  Cardiovascular:     Rate and Rhythm: Normal rate and regular rhythm.     Heart sounds: No murmur heard. Pulmonary:     Effort: Pulmonary effort is normal. No respiratory distress.     Breath sounds: Normal breath sounds.  Abdominal:     Palpations: Abdomen is soft.     Tenderness: There is no abdominal tenderness.  Musculoskeletal:        General: No swelling.     Cervical back: Neck supple.  Skin:    General: Skin is warm and dry.     Capillary Refill: Capillary refill takes less than 2 seconds.  Neurological:     Mental Status: She is alert.  Psychiatric:        Mood and Affect: Mood normal.     Procedures  Procedures  ED Course / MDM    Medical Decision Making I was handed off this person, pending the CTA.  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  I reviewed her blood work which was unremarkable.  Patient was discharged home with strict return precautions  Amount and/or Complexity of Data Reviewed Labs: ordered. Radiology: ordered.  Risk Prescription drug management.         Pete Pelt, [place] 08/12/23 2036    Lonell Grandchild, MD 08/13/23 1218

## 2023-08-12 NOTE — Telephone Encounter (Signed)
Patient has been advised and she is going to [facility]

## 2023-08-12 NOTE — Telephone Encounter (Signed)
I have spoken to the pt and she states she has some labored breathing . When she takes a deep breath it does hurt  Tested positive for COVID last Wed  She finished the paxlovid  Pt was instructed by triage as well as my self to go to the ER .

## 2023-08-12 NOTE — Telephone Encounter (Signed)
Agree w/ advice to go to ER for SOB

## 2023-08-12 NOTE — Discharge Instructions (Addendum)
Please read and follow all provided instructions.  Your diagnoses today include:  1. Shortness of breath   2. Chest tightness   3. Elevated d-dimer     Tests performed today include: An EKG of your heart A chest x-ray Cardiac enzymes - a blood test for heart muscle damage Blood counts and electrolytes CT scan of the chest to evaluate for blood clots D-dimer test was elevated Vital signs. See below for your results today.   Medications prescribed:  None  Take any prescribed medications only as directed.  Follow-up instructions: Please follow-up with your primary care provider in the next 3 days if you aren't feeling better.   Return instructions:  SEEK IMMEDIATE MEDICAL ATTENTION IF: You have severe chest pain, especially if the pain is crushing or pressure-like and spreads to the arms, back, neck, or jaw, or if you have sweating, nausea or vomiting, or trouble with breathing. THIS IS AN EMERGENCY. Do not wait to see if the pain will go away. Get medical help at once. Call 911. DO NOT drive yourself to the hospital.  Your chest pain gets worse and does not go away after a few minutes of rest.  You have an attack of chest pain lasting longer than what you usually experience.  You have significant dizziness, if you pass out, or have trouble walking.  You have chest pain not typical of your usual pain for which you originally saw your caregiver.  You have any other emergent concerns regarding your health.  Additional Information: Chest pain comes from many different causes. Your caregiver has diagnosed you as having chest pain that is not specific for one problem, but does not require admission.  You are at low risk for an acute heart condition or other serious illness.   Your vital signs today were: BP 128/70 (BP Location: Right Arm)   Pulse 78   Temp 98.3 F (36.8 C) (Oral)   Resp 16   Ht 5\' 5"  (1.651 m)   Wt 81.6 kg   SpO2 100%   BMI 29.95 kg/m  If your blood pressure  (BP) was elevated above 135/85 this visit, please have this repeated by your doctor within one month. --------------

## 2023-08-12 NOTE — ED Provider Notes (Signed)
Laura Stuart EMERGENCY DEPARTMENT AT [facility] Provider Note   CSN: [id number] Arrival date & time: 08/12/23  1343     History  Chief Complaint  Patient presents with   Shortness of Breath         Laura Stuart is a 77 y.o. female.  Patient presents to the emergency department today for evaluation of chest heaviness and mild shortness of breath.  Patient has a history of high cholesterol, no pulmonary history or cardiac history.  No history of blood clots.  Patient became symptomatic with a "head cold" on 9/4 and tested positive for COVID that day.  She started taking Paxlovid on 9/6 and finished this 2 days ago.  She tolerated it well.  She states that her symptoms resolved.  Today while out she developed a heaviness in her mid chest.  No worsening cough.  She denies any overt chest pains.  She has not been short of breath to the point where it has interfered with activities.  As symptoms persisted, she wanted to come and get checked.  She was concerned about pneumonia.  Patient denies risk factors for pulmonary embolism including: unilateral leg swelling, history of DVT/PE/other blood clots, use of exogenous hormones, recent immobilizations, recent surgery, recent travel (>4hr segment), malignancy, hemoptysis.        Home Medications Prior to Admission medications   Medication Sig Start Date End Date Taking? Authorizing Provider  atorvastatin (LIPITOR) 10 MG tablet Take 1 tablet (10 mg total) by mouth 2 (two) times a week. 12/07/22   Sheliah Hatch, MD  Calcium Carb-Cholecalciferol (CALCIUM 600-D PO) Take 600 mg by mouth in the morning and at bedtime.    [provider]  cetirizine (ZYRTEC) 10 MG tablet TAKE 1 TABLET BY MOUTH DAILY Patient taking differently: Take 10 mg by mouth daily as needed for allergies. 04/14/21   Sheliah Hatch, MD  fluticasone Aleda Grana) 50 MCG/ACT nasal spray INSTILL 2 SPRAYS INTO EACH NOSTRIL DAILY 05/03/23   Sheliah Hatch, MD  glucosamine-chondroitin 500-400 MG tablet Take 1 tablet by mouth 3 (three) times daily.    [provider]  zolpidem (AMBIEN) 10 MG tablet TAKE 1 TABLET BY MOUTH EACH NIGHT AT BEDTIME AS NEEDED FOR SLEEP 03/16/23   Sheliah Hatch, MD      Allergies    Penicillins    Review of Systems   Review of Systems  Physical Exam Updated Vital Signs BP 138/70 (BP Location: Right Arm)   Pulse 94   Temp 98.3 F (36.8 C) (Oral)   Resp 20   Ht 5\' 5"  (1.651 m)   Wt 81.6 kg   SpO2 100%   BMI 29.95 kg/m   Physical Exam Vitals and nursing note reviewed.  Constitutional:      Appearance: She is well-developed. She is not diaphoretic.  HENT:     Head: Normocephalic and atraumatic.     Mouth/Throat:     Mouth: Mucous membranes are not dry.  Eyes:     Conjunctiva/sclera: Conjunctivae normal.  Neck:     Vascular: Normal carotid pulses. No JVD.     Trachea: Trachea normal. No tracheal deviation.  Cardiovascular:     Rate and Rhythm: Normal rate and regular rhythm.     Pulses: No decreased pulses.          Radial pulses are 2+ on the right side and 2+ on the left side.     Heart sounds: Normal heart sounds, S1 normal  and S2 normal. No murmur heard. Pulmonary:     Effort: Pulmonary effort is normal. No respiratory distress.     Breath sounds: No wheezing, rhonchi or rales.  Chest:     Chest wall: No tenderness.  Abdominal:     General: Bowel sounds are normal.     Palpations: Abdomen is soft.     Tenderness: There is no abdominal tenderness. There is no guarding or rebound.  Musculoskeletal:        General: Normal range of motion.     Cervical back: Normal range of motion and neck supple. No muscular tenderness.  Skin:    General: Skin is warm and dry.     Coloration: Skin is not pale.  Neurological:     Mental Status: She is alert.     ED Results / Procedures / Treatments   Labs (all labs ordered are listed, but only abnormal results are displayed) Labs  Reviewed  D-DIMER, QUANTITATIVE (NOT AT [facility]) - Abnormal; Notable for the following components:      Result Value   D-Dimer, Quant 2.76 (*)    All other components within normal limits  CBC WITH DIFFERENTIAL/PLATELET  COMPREHENSIVE METABOLIC PANEL  TROPONIN I (HIGH SENSITIVITY)  TROPONIN I (HIGH SENSITIVITY)    EKG EKG Interpretation Date/Time:  Thursday August 12 2023 15:00:07 EDT Ventricular Rate:  72 PR Interval:  184 QRS Duration:  78 QT Interval:  361 QTC Calculation: 395 R Axis:   53  Text Interpretation: Sinus rhythm RSR' in V1 or V2, right VCD or RVH Borderline T abnormalities, anterior leads Confirmed by Vonita Moss [id number]) on 08/12/2023 3:59:50 PM  Radiology DG Chest 2 View  Result Date: 08/12/2023 CLINICAL DATA:  Shortness of breath EXAM: CHEST - 2 VIEW COMPARISON:  02/05/2013 FINDINGS: Cardiac and mediastinal contours are within normal limits. No focal pulmonary opacity. No pleural effusion or pneumothorax. No acute osseous abnormality. IMPRESSION: No acute cardiopulmonary process. Electronically Signed   By: Wiliam Ke M.D.   On: 08/12/2023 15:40    Procedures Procedures    Medications Ordered in ED Medications  iohexol (OMNIPAQUE) 300 MG/ML solution 80 mL (80 mLs Intravenous Contrast Given 08/12/23 1806)    ED Course/ Medical Decision Making/ A&P    Patient seen and examined. History obtained directly from patient.   Labs/EKG: Ordered CBC, CMP, troponin, D-dimer.  EKG.  Imaging: Ordered chest x-ray.  Medications/Fluids: None ordered  Most recent vital signs reviewed and are as follows: BP 138/70 (BP Location: Right Arm)   Pulse 94   Temp 98.3 F (36.8 C) (Oral)   Resp 20   Ht 5\' 5"  (1.651 m)   Wt 81.6 kg   SpO2 100%   BMI 29.95 kg/m   Initial impression: Patient is overall well-appearing, reassuring vital signs.  She is up in the hallway without any distress.  Lung sounds are clear.  Given recent COVID infection, will evaluate for  pneumonia, PE.  Given chest heaviness, will also perform cardiac workup with EKG, chest x-ray and troponin.  6:25 PM Reassessment performed. Patient appears comfortable.  Labs personally reviewed and interpreted including: CBC unremarkable; CMP unremarkable; troponin normal at 5.  D-dimer is elevated at 2.76.  Patient does have EKG with nonspecific T wave changes, similar changes to 2018 EKG but QRS is shorter today.  Imaging personally visualized and interpreted including: Chest x-ray, agree negative.  Reviewed pertinent lab work and imaging with patient at bedside. Questions answered.   Most current vital signs reviewed  and are as follows: BP 128/70 (BP Location: Right Arm)   Pulse 78   Temp 98.3 F (36.8 C) (Oral)   Resp 16   Ht 5\' 5"  (1.651 m)   Wt 81.6 kg   SpO2 100%   BMI 29.95 kg/m   Plan: Awaiting CT angio of the chest to evaluate for PE.  If negative, patient aware that she will be discharged.  We discussed continue supportive treatment.  Small PA-C aware of patient at shift change. Dispo per PE study.                                  Medical Decision Making Amount and/or Complexity of Data Reviewed Labs: ordered. Radiology: ordered.  Risk Prescription drug management.   For this patient's complaint of chest pain/SOB, the following emergent conditions were considered on the differential diagnosis: acute coronary syndrome, pulmonary embolism, pneumothorax, myocarditis, pericardial tamponade, aortic dissection, thoracic aortic aneurysm complication, esophageal perforation.   Other causes were also considered including: gastroesophageal reflux disease, musculoskeletal pain including costochondritis, pneumonia/pleurisy, herpes zoster, pericarditis.  In regards to possibility of ACS, patient has atypical features of pain, non-ischemic and unchanged EKG and negative troponin(s).  Overall low concern for ACS including stable or unstable angina.  In regards to possibility  of PE, recent COVID infection, elevated D-dimer, CTA pending.  Symptoms did worsen after discontinuation of Paxlovid.  There is some possibility of mild rebound symptoms after taking the antiviral.  Overall patient appears comfortable, is not hypoxic or tachycardic, no hypotension.  If remainder of workup is reassuring, I would have no concerns about her going home tonight with return precautions and PCP follow-up as outpatient.        Final Clinical Impression(s) / ED Diagnoses Final diagnoses:  Shortness of breath  Chest tightness  Elevated d-dimer    Rx / DC Orders ED Discharge Orders     None         Renne Crigler, Cordelia Poche 08/12/23 1828    Rondel Baton, MD 08/15/23 1946

## 2023-09-09 ENCOUNTER — Telehealth: Payer: Self-pay

## 2023-09-09 NOTE — Telephone Encounter (Signed)
Transition Care Management Unsuccessful Follow-up Telephone Call  Date of discharge and from where:  08/12/2023 [facility]  Attempts:  1st Attempt  Reason for unsuccessful TCM follow-up call:  No answer/busy  Yosselyn Tax [facility]  [facility], [facility] Guide Direct Dial: [phone]  Website: [email or web address]

## 2023-09-10 ENCOUNTER — Telehealth: Payer: Self-pay

## 2023-09-10 NOTE — Telephone Encounter (Signed)
Transition Care Management Unsuccessful Follow-up Telephone Call  Date of discharge and from where:  08/12/2023 [facility]  Attempts:  2nd Attempt  Reason for unsuccessful TCM follow-up call:  Left voice message  Chealsey Miyamoto [facility]  [facility], [facility] Guide Direct Dial: [phone]  Website: [email or web address]

## 2023-10-11 ENCOUNTER — Encounter: Payer: Self-pay | Admitting: Family Medicine

## 2023-10-11 ENCOUNTER — Ambulatory Visit: Payer: Medicare Other | Admitting: Family Medicine

## 2023-10-11 VITALS — BP 128/70 | HR 73 | Temp 98.0°F | Ht 65.0 in | Wt 186.0 lb

## 2023-10-11 DIAGNOSIS — E559 Vitamin D deficiency, unspecified: Secondary | ICD-10-CM

## 2023-10-11 DIAGNOSIS — Z0000 Encounter for general adult medical examination without abnormal findings: Secondary | ICD-10-CM | POA: Diagnosis not present

## 2023-10-11 DIAGNOSIS — E785 Hyperlipidemia, unspecified: Secondary | ICD-10-CM | POA: Diagnosis not present

## 2023-10-11 LAB — TSH: TSH: 3.62 u[IU]/mL (ref 0.35–5.50)

## 2023-10-11 LAB — CBC WITH DIFFERENTIAL/PLATELET
Basophils Absolute: 0.1 10*3/uL (ref 0.0–0.1)
Basophils Relative: 1.2 % (ref 0.0–3.0)
Eosinophils Absolute: 0.2 10*3/uL (ref 0.0–0.7)
Eosinophils Relative: 3.7 % (ref 0.0–5.0)
HCT: 42.5 % (ref 36.0–46.0)
Hemoglobin: 14.4 g/dL (ref 12.0–15.0)
Lymphocytes Relative: 25.5 % (ref 12.0–46.0)
Lymphs Abs: 1.4 10*3/uL (ref 0.7–4.0)
MCHC: 33.8 g/dL (ref 30.0–36.0)
MCV: 90.3 fL (ref 78.0–100.0)
Monocytes Absolute: 0.4 10*3/uL (ref 0.1–1.0)
Monocytes Relative: 7.9 % (ref 3.0–12.0)
Neutro Abs: 3.3 10*3/uL (ref 1.4–7.7)
Neutrophils Relative %: 61.7 % (ref 43.0–77.0)
Platelets: 255 10*3/uL (ref 150.0–400.0)
RBC: 4.71 Mil/uL (ref 3.87–5.11)
RDW: 14.4 % (ref 11.5–15.5)
WBC: 5.3 10*3/uL (ref 4.0–10.5)

## 2023-10-11 LAB — LIPID PANEL
Cholesterol: 200 mg/dL (ref 0–200)
HDL: 60.9 mg/dL (ref >39.00)
LDL Cholesterol: 117 mg/dL — ABNORMAL HIGH (ref 0–99)
NonHDL: 139.3
Total CHOL/HDL Ratio: 3
Triglycerides: 114 mg/dL (ref 0.0–149.0)
VLDL: 22.8 mg/dL (ref 0.0–40.0)

## 2023-10-11 LAB — BASIC METABOLIC PANEL
BUN: 19 mg/dL (ref 6–23)
CO2: 29 meq/L (ref 19–32)
Calcium: 9.8 mg/dL (ref 8.4–10.5)
Chloride: 105 meq/L (ref 96–112)
Creatinine, Ser: 0.77 mg/dL (ref 0.40–1.20)
GFR: 74.39 mL/min (ref >60.00)
Glucose, Bld: 97 mg/dL (ref 70–99)
Potassium: 4.7 meq/L (ref 3.5–5.1)
Sodium: 141 meq/L (ref 135–145)

## 2023-10-11 LAB — HEPATIC FUNCTION PANEL
ALT: 19 U/L (ref 0–35)
AST: 27 U/L (ref 0–37)
Albumin: 4.4 g/dL (ref 3.5–5.2)
Alkaline Phosphatase: 84 U/L (ref 39–117)
Bilirubin, Direct: 0 mg/dL (ref 0.0–0.3)
Total Bilirubin: 0.5 mg/dL (ref 0.2–1.2)
Total Protein: 7.1 g/dL (ref 6.0–8.3)

## 2023-10-11 LAB — VITAMIN D 25 HYDROXY (VIT D DEFICIENCY, FRACTURES): VITD: 67.26 ng/mL (ref 30.00–100.00)

## 2023-10-11 NOTE — Assessment & Plan Note (Signed)
Pt's PE WNL w/ exception of BMI.  UTD on immunizations.  No longer doing mammo or colonoscopy.  Check labs.  Anticipatory guidance provided.

## 2023-10-11 NOTE — Assessment & Plan Note (Signed)
Check labs and replete prn. 

## 2023-10-11 NOTE — Patient Instructions (Signed)
Follow up in 6 months to recheck cholesterol We'll notify you of your lab results and make any changes if needed Keep up the good work on healthy diet and regular exercise- you're doing great! Call with any questions or concerns Stay Safe!  Stay Healthy! Happy Thanksgiving!!

## 2023-10-11 NOTE — Progress Notes (Signed)
   Subjective:    Patient ID: Laura Stuart, female    DOB: Jul 25, 1946, 77 y.o.   MRN: [id number]  HPI CPE- UTD on colonoscopy, Tdap, PNA, flu  Patient Care Team    Relationship Specialty Notifications Start End  Sheliah Hatch, MD PCP - General Family Medicine  02/04/12    Comment: Urbano Heir, Porfirio Mylar, MD Consulting Physician Neurology  08/20/15   Hilarie Fredrickson, MD Consulting Physician Gastroenterology  08/20/15   Elinor Parkinson, [place] Consulting Physician Podiatry  08/24/17   Geralyn Flash, MD    06/23/22      Health Maintenance  Topic Date Due   MAMMOGRAM  06/09/2024 (Originally 10/14/2022)   DEXA SCAN  10/28/2023   Medicare Annual Wellness (AWV)  06/09/2024   Colonoscopy  06/28/2025   DTaP/Tdap/Td (2 - Td or Tdap) 08/04/2028   Pneumonia Vaccine 33+ Years old  Completed   INFLUENZA VACCINE  Completed   Hepatitis C Screening  Completed   Zoster Vaccines- Shingrix  Completed   HPV VACCINES  Aged Out   COVID-19 Vaccine  Discontinued      Review of Systems Patient reports no hearing changes, adenopathy,fever, weight change,  persistant/recurrent hoarseness , swallowing issues, chest pain, palpitations, edema, persistant/recurrent cough, hemoptysis, dyspnea (rest/exertional/paroxysmal nocturnal), gastrointestinal bleeding (melena, rectal bleeding), abdominal pain, significant heartburn, bowel changes, GU symptoms (dysuria, hematuria, incontinence), Gyn symptoms (abnormal  bleeding, pain),  syncope, focal weakness, memory loss, numbness & tingling, skin/hair/nail changes, abnormal bruising or bleeding, anxiety, or depression.   + worsening vision    Objective:   Physical Exam General Appearance:    Alert, cooperative, no distress, appears stated age  Head:    Normocephalic, without obvious abnormality, atraumatic  Eyes:    PERRL, conjunctiva/corneas clear, EOM's intact both eyes  Ears:    Normal TM's and external ear canals, both ears  Nose:   Nares normal, septum  midline, mucosa normal, no drainage    or sinus tenderness  Throat:   Lips, mucosa, and tongue normal; teeth and gums normal  Neck:   Supple, symmetrical, trachea midline, no adenopathy;    Thyroid: no enlargement/tenderness/nodules  Back:     Symmetric, no curvature, ROM normal, no CVA tenderness  Lungs:     Clear to auscultation bilaterally, respirations unlabored  Chest Wall:    No tenderness or deformity   Heart:    Regular rate and rhythm, S1 and S2 normal, no murmur, rub   or gallop  Breast Exam:    Deferred  Abdomen:     Soft, non-tender, bowel sounds active all four quadrants,    no masses, no organomegaly  Genitalia:    Deferred  Rectal:    Extremities:   Extremities normal, atraumatic, no cyanosis or edema  Pulses:   2+ and symmetric all extremities  Skin:   Skin color, texture, turgor normal, no rashes or lesions  Lymph nodes:   Cervical, supraclavicular, and axillary nodes normal  Neurologic:   CNII-XII intact, normal strength, sensation and reflexes    throughout         Assessment & Plan:

## 2023-10-11 NOTE — Assessment & Plan Note (Signed)
Chronic problem.  Tolerating statin w/o difficulty.  Check labs.  Adjust meds prn  

## 2023-10-13 ENCOUNTER — Telehealth: Payer: Self-pay

## 2023-10-13 NOTE — Telephone Encounter (Signed)
-----   Message from Neena Rhymes sent at 10/13/2023  7:36 AM EST ----- Labs look great!  No changes at this time

## 2023-11-05 DIAGNOSIS — G4733 Obstructive sleep apnea (adult) (pediatric): Secondary | ICD-10-CM | POA: Diagnosis not present

## 2023-11-10 DIAGNOSIS — K080 Exfoliation of teeth due to systemic causes: Secondary | ICD-10-CM | POA: Diagnosis not present

## 2023-12-06 DIAGNOSIS — G4733 Obstructive sleep apnea (adult) (pediatric): Secondary | ICD-10-CM | POA: Diagnosis not present

## 2024-01-01 ENCOUNTER — Other Ambulatory Visit: Payer: Self-pay | Admitting: Family Medicine

## 2024-01-19 ENCOUNTER — Other Ambulatory Visit: Payer: Self-pay | Admitting: Medical Genetics

## 2024-02-07 ENCOUNTER — Other Ambulatory Visit: Payer: Self-pay | Admitting: Family Medicine

## 2024-02-11 ENCOUNTER — Encounter: Payer: Self-pay | Admitting: Family Medicine

## 2024-02-11 DIAGNOSIS — Z0184 Encounter for antibody response examination: Secondary | ICD-10-CM

## 2024-02-14 ENCOUNTER — Telehealth: Payer: Self-pay

## 2024-02-14 NOTE — Telephone Encounter (Signed)
 Called patient just to set up appointment, then noticed appointment was already made, verified appointment and to make sure this appointment time worked for her.

## 2024-02-15 ENCOUNTER — Other Ambulatory Visit

## 2024-02-15 DIAGNOSIS — Z0184 Encounter for antibody response examination: Secondary | ICD-10-CM

## 2024-02-15 DIAGNOSIS — H524 Presbyopia: Secondary | ICD-10-CM | POA: Diagnosis not present

## 2024-02-16 LAB — MEASLES/MUMPS/RUBELLA IMMUNITY
Mumps IgG: 153 [AU]/ml
Rubella: 21.7 {index}
Rubeola IgG: >300 [AU]/ml

## 2024-02-17 ENCOUNTER — Encounter: Payer: Self-pay | Admitting: Family Medicine

## 2024-02-17 NOTE — Telephone Encounter (Signed)
 Called patient to relay results, Left VM to return call

## 2024-02-17 NOTE — Telephone Encounter (Signed)
 Patient is aware, she reviewed on my chart.

## 2024-02-17 NOTE — Telephone Encounter (Signed)
-----   Message from Neena Rhymes sent at 02/17/2024  7:27 AM EDT ----- Immune to measles, mumps, and rubella- great news!

## 2024-02-19 ENCOUNTER — Encounter (HOSPITAL_COMMUNITY): Payer: Self-pay

## 2024-02-19 ENCOUNTER — Emergency Department (HOSPITAL_COMMUNITY)
Admission: EM | Admit: 2024-02-19 | Discharge: 2024-02-19 | Disposition: A | Discharge status: Home / Self Care | Attending: Emergency Medicine | Admitting: Emergency Medicine

## 2024-02-19 ENCOUNTER — Emergency Department (HOSPITAL_COMMUNITY)

## 2024-02-19 DIAGNOSIS — Z7901 Long term (current) use of anticoagulants: Secondary | ICD-10-CM | POA: Diagnosis not present

## 2024-02-19 DIAGNOSIS — R071 Chest pain on breathing: Secondary | ICD-10-CM | POA: Diagnosis not present

## 2024-02-19 DIAGNOSIS — I4891 Unspecified atrial fibrillation: Secondary | ICD-10-CM | POA: Insufficient documentation

## 2024-02-19 DIAGNOSIS — R002 Palpitations: Secondary | ICD-10-CM | POA: Diagnosis not present

## 2024-02-19 LAB — TSH: TSH: 2.388 u[IU]/mL (ref 0.350–4.500)

## 2024-02-19 LAB — BASIC METABOLIC PANEL
Anion gap: 8 (ref 5–15)
BUN: 19 mg/dL (ref 8–23)
CO2: 24 mmol/L (ref 22–32)
Calcium: 9.7 mg/dL (ref 8.9–10.3)
Chloride: 112 mmol/L — ABNORMAL HIGH (ref 98–111)
Creatinine, Ser: 1.02 mg/dL — ABNORMAL HIGH (ref 0.44–1.00)
GFR, Estimated: 57 mL/min — ABNORMAL LOW (ref >60)
Glucose, Bld: 146 mg/dL — ABNORMAL HIGH (ref 70–99)
Potassium: 4 mmol/L (ref 3.5–5.1)
Sodium: 144 mmol/L (ref 135–145)

## 2024-02-19 LAB — CBC
HCT: 39.8 % (ref 36.0–46.0)
Hemoglobin: 13.1 g/dL (ref 12.0–15.0)
MCH: 29.4 pg (ref 26.0–34.0)
MCHC: 32.9 g/dL (ref 30.0–36.0)
MCV: 89.2 fL (ref 80.0–100.0)
Platelets: 213 10*3/uL (ref 150–400)
RBC: 4.46 MIL/uL (ref 3.87–5.11)
RDW: 13.3 % (ref 11.5–15.5)
WBC: 6.6 10*3/uL (ref 4.0–10.5)
nRBC: 0 % (ref 0.0–0.2)

## 2024-02-19 LAB — TROPONIN I (HIGH SENSITIVITY)
Troponin I (High Sensitivity): 84 ng/L — ABNORMAL HIGH (ref <18)
Troponin I (High Sensitivity): 113 ng/L (ref <18)

## 2024-02-19 LAB — MAGNESIUM: Magnesium: 2.1 mg/dL (ref 1.7–2.4)

## 2024-02-19 MED ORDER — APIXABAN 5 MG PO TABS: 5.0000 mg | ORAL_TABLET | Freq: Two times a day (BID) | ORAL | 0 refills | Status: DC

## 2024-02-19 MED ORDER — DILTIAZEM HCL 30 MG PO TABS: 30.0000 mg | ORAL_TABLET | Freq: Once | ORAL | 0 refills | Status: DC | PRN

## 2024-02-19 MED ORDER — APIXABAN 5 MG PO TABS
5.0000 mg | ORAL_TABLET | Freq: Two times a day (BID) | ORAL | 0 refills | Status: DC
Start: 2024-02-19 — End: 2024-03-20

## 2024-02-19 MED ORDER — SODIUM CHLORIDE 0.9 % IV BOLUS
500.0000 mL | Freq: Once | INTRAVENOUS | Status: AC
  Administered 2024-02-19: 500 mL via INTRAVENOUS

## 2024-02-19 MED ORDER — APIXABAN 5 MG PO TABS
5.0000 mg | ORAL_TABLET | Freq: Two times a day (BID) | ORAL | Status: DC
  Administered 2024-02-19: 5 mg via ORAL
  Filled 2024-02-19: qty 1

## 2024-02-19 MED ORDER — DILTIAZEM HCL-DEXTROSE 125-5 MG/125ML-% IV SOLN (PREMIX)
5.0000 mg/h | INTRAVENOUS | Status: DC
  Administered 2024-02-19: 5 mg/h via INTRAVENOUS
  Filled 2024-02-19: qty 125

## 2024-02-19 MED ORDER — DILTIAZEM LOAD VIA INFUSION
20.0000 mg | Freq: Once | INTRAVENOUS | Status: AC
  Administered 2024-02-19: 20 mg via INTRAVENOUS
  Filled 2024-02-19: qty 20

## 2024-02-19 NOTE — ED Triage Notes (Signed)
 Pt c/o central chest pressure, palpitations, and SOB starting this afternoon.  Pain score 7/10.  Pt reports symptoms started after walking up steps.  Pt denies cardiac Hx.

## 2024-02-19 NOTE — ED Notes (Signed)
 MD Haviland aware of Troponin 113, order to continue with discharge.

## 2024-02-19 NOTE — ED Notes (Signed)
 Patient verbalizes understanding of discharge instructions. Opportunity for questioning and answers were provided. Armband removed by staff, pt discharged from ED. Pt taken to ED entrance via wheel chair.

## 2024-02-19 NOTE — ED Provider Notes (Addendum)
 [place] EMERGENCY DEPARTMENT AT [facility] Provider Note   CSN: [id number] Arrival date & time: 02/19/24  1450     History  Chief Complaint  Patient presents with   Palpitations   Shortness of Breath    Laura Stuart is a 78 y.o. female.  Pt is a 78 yo female with pmhx significant for sleep apnea, HLD, and trigeminal neuralgia (now fixed with surgery).  Pt said she developed chest pressure and sob starting around 1400.  Pt said she has never had this happen to her in the past.        Home Medications Prior to Admission medications   Medication Sig Start Date End Date Taking? Authorizing Provider  apixaban (ELIQUIS) 5 MG TABS tablet Take 1 tablet (5 mg total) by mouth 2 (two) times daily. 02/19/24 03/20/24 Yes Jacalyn Lefevre, MD  diltiazem (CARDIZEM) 30 MG tablet Take 1 tablet (30 mg total) by mouth once as needed for up to 1 dose (HR over 100). 02/19/24  Yes Jacalyn Lefevre, MD  atorvastatin (LIPITOR) 10 MG tablet TAKE 1 TABLET BY MOUTH TWICE A WEEK 01/03/24   Sheliah Hatch, MD  Calcium Carb-Cholecalciferol (CALCIUM 600-D PO) Take 600 mg by mouth in the morning and at bedtime.    [provider]  cetirizine (ZYRTEC) 10 MG tablet TAKE 1 TABLET BY MOUTH DAILY Patient taking differently: Take 10 mg by mouth daily as needed for allergies. 04/14/21   Sheliah Hatch, MD  fluticasone Aleda Grana) 50 MCG/ACT nasal spray INSTILL 2 SPRAYS INTO EACH NOSTRIL DAILY 05/03/23   Sheliah Hatch, MD  glucosamine-chondroitin 500-400 MG tablet Take 1 tablet by mouth 3 (three) times daily.    [provider]  zolpidem (AMBIEN) 10 MG tablet TAKE 1 TABLET BY MOUTH EACH NIGHT AT BEDTIME AS NEEDED FOR SLEEP 02/07/24   Sheliah Hatch, MD      Allergies    Penicillins    Review of Systems   Review of Systems  Cardiovascular:  Positive for chest pain and palpitations.  All other systems reviewed and are negative.   Physical Exam Updated Vital  Signs BP (!) 105/53   Pulse 93   Temp (!) 97.5 F (36.4 C)   Resp (!) 26   Ht 5\' 5"  (1.651 m)   Wt 81.6 kg   SpO2 97%   BMI 29.95 kg/m  Physical Exam Vitals and nursing note reviewed.  Constitutional:      Appearance: She is well-developed.  HENT:     Head: Normocephalic and atraumatic.     Mouth/Throat:     Mouth: Mucous membranes are moist.     Pharynx: Oropharynx is clear.  Eyes:     Extraocular Movements: Extraocular movements intact.     Pupils: Pupils are equal, round, and reactive to light.  Cardiovascular:     Rate and Rhythm: Tachycardia present. Rhythm irregular.  Pulmonary:     Effort: Pulmonary effort is normal.     Breath sounds: Normal breath sounds.  Abdominal:     General: Bowel sounds are normal.     Palpations: Abdomen is soft.  Musculoskeletal:        General: Normal range of motion.     Cervical back: Normal range of motion and neck supple.  Skin:    General: Skin is warm.     Capillary Refill: Capillary refill takes less than 2 seconds.  Neurological:     General: No focal deficit present.  Mental Status: She is alert and oriented to person, place, and time.  Psychiatric:        Mood and Affect: Mood normal.        Behavior: Behavior normal.     ED Results / Procedures / Treatments   Labs (all labs ordered are listed, but only abnormal results are displayed) Labs Reviewed  BASIC METABOLIC PANEL - Abnormal; Notable for the following components:      Result Value   Chloride 112 (*)    Glucose, Bld 146 (*)    Creatinine, Ser 1.02 (*)    GFR, Estimated 57 (*)    All other components within normal limits  TROPONIN I (HIGH SENSITIVITY) - Abnormal; Notable for the following components:   Troponin I (High Sensitivity) 84 (*)    All other components within normal limits  CBC  MAGNESIUM  TSH  TROPONIN I (HIGH SENSITIVITY)    EKG EKG Interpretation Date/Time:  Saturday February 19 2024 17:25:15 EDT Ventricular Rate:  89 PR  Interval:  209 QRS Duration:  96 QT Interval:  348 QTC Calculation: 424 R Axis:   57  Text Interpretation: Sinus rhythm Low voltage, precordial leads Abnormal R-wave progression, early transition Borderline T abnormalities, anterior leads now in sinus Confirmed by Jacalyn Lefevre [phone]) on 02/19/2024 5:32:50 PM  Radiology DG Chest 2 View Result Date: 02/19/2024 CLINICAL DATA:  Chest pain short of breath EXAM: CHEST - 2 VIEW COMPARISON:  08/12/2023 FINDINGS: The heart size and mediastinal contours are within normal limits. Both lungs are clear. The visualized skeletal structures are unremarkable. IMPRESSION: No active cardiopulmonary disease. Electronically Signed   By: Jasmine Pang M.D.   On: 02/19/2024 16:42    Procedures Procedures    Medications Ordered in ED Medications  diltiazem (CARDIZEM) 1 mg/mL load via infusion 20 mg (20 mg Intravenous Bolus from Bag 02/19/24 1652)    And  diltiazem (CARDIZEM) 125 mg in dextrose 5% 125 mL (1 mg/mL) infusion (0 mg/hr Intravenous Stopped 02/19/24 1738)  sodium chloride 0.9 % bolus 500 mL (0 mLs Intravenous Stopped 02/19/24 1817)    ED Course/ Medical Decision Making/ A&P                                 Medical Decision Making Amount and/or Complexity of Data Reviewed Labs: ordered. Radiology: ordered.  Risk Prescription drug management.   This patient presents to the ED for concern of cp, this involves an extensive number of treatment options, and is a complaint that carries with it a high risk of complications and morbidity.  The differential diagnosis includes cad, afib/aflutter/svt   Co morbidities that complicate the patient evaluation  leep apnea, HLD, and trigeminal neuralgia (now fixed with surgery)   Additional history obtained:  Additional history obtained from epic chart review External records from outside source obtained and reviewed including husband   Lab Tests:  I Ordered, and personally interpreted labs.   The pertinent results include:  cbc nl, bmp nl, trop elevated at 84, mg 2.1   Imaging Studies ordered:  I ordered imaging studies including cxr  I independently visualized and interpreted imaging which showed No active cardiopulmonary disease.  I agree with the radiologist interpretation   Cardiac Monitoring:  The patient was maintained on a cardiac monitor.  I personally viewed and interpreted the cardiac monitored which showed an underlying rhythm of: afib   Medicines ordered and prescription drug management:  I ordered medication including cardizem  for sx  Reevaluation of the patient after these medicines showed that the patient improved I have reviewed the patients home medicines and have made adjustments as needed  Critical Interventions:  Cardizem/cards   Consultations Obtained:  I requested consultation with the cardiologist (Dr. Virgina Jock),  and discussed lab and imaging findings as well as pertinent plan - she recommends d/c home now that she has converted to NSR.  She recommends 1 month of Eliquis and referral to the afib clinic.  She also recommends a 30 mg dilt "pill in the pocket" to take if HR goes above 100.   Problem List / ED Course:  Afib with rvr:  cardizem given and brought HR down from 140s to 80s and cp is better.  Pt has converted to NSR now. CHA2DS2/VAS Stroke Risk Points:  72 (age, female)  Elevated trop likely due to elevated HR.  CP gone after HR back to normal.  Cards not concerned.   Reevaluation:  After the interventions noted above, I reevaluated the patient and found that they have :improved   Social Determinants of Health:  Lives at home   Dispostion:  After consideration of the diagnostic results and the patients response to treatment, I feel that the patent would benefit from discharge with outpatient f/u  CRITICAL CARE Performed by: Jacalyn Lefevre   Total critical care time: 30 minutes  Critical care time was exclusive of  separately billable procedures and treating other patients.  Critical care was necessary to treat or prevent imminent or life-threatening deterioration.  Critical care was time spent personally by me on the following activities: development of treatment plan with patient and/or surrogate as well as nursing, discussions with consultants, evaluation of patient's response to treatment, examination of patient, obtaining history from patient or surrogate, ordering and performing treatments and interventions, ordering and review of laboratory studies, ordering and review of radiographic studies, pulse oximetry and re-evaluation of patient's condition.         Final Clinical Impression(s) / ED Diagnoses Final diagnoses:  Atrial fibrillation with RVR (HCC)    Rx / DC Orders ED Discharge Orders          Ordered    Amb referral to AFIB Clinic        02/19/24 1810    apixaban (ELIQUIS) 5 MG TABS tablet  2 times daily        02/19/24 1830    diltiazem (CARDIZEM) 30 MG tablet  Once PRN        02/19/24 1831              Jacalyn Lefevre, MD 02/19/24 Ovidio Kin    Jacalyn Lefevre, MD 02/19/24 [phone]

## 2024-02-23 ENCOUNTER — Encounter: Payer: Self-pay | Admitting: Family Medicine

## 2024-02-25 ENCOUNTER — Other Ambulatory Visit (HOSPITAL_COMMUNITY): Payer: Self-pay

## 2024-02-25 ENCOUNTER — Ambulatory Visit (HOSPITAL_COMMUNITY)
Admission: RE | Admit: 2024-02-25 | Discharge: 2024-02-25 | Disposition: A | Discharge status: Home / Self Care | Source: Ambulatory Visit | Attending: Internal Medicine | Admitting: Internal Medicine

## 2024-02-25 VITALS — BP 124/68 | HR 68 | Ht 65.0 in | Wt 183.0 lb

## 2024-02-25 DIAGNOSIS — D6869 Other thrombophilia: Secondary | ICD-10-CM | POA: Diagnosis not present

## 2024-02-25 DIAGNOSIS — G4730 Sleep apnea, unspecified: Secondary | ICD-10-CM | POA: Insufficient documentation

## 2024-02-25 DIAGNOSIS — I480 Paroxysmal atrial fibrillation: Secondary | ICD-10-CM | POA: Diagnosis not present

## 2024-02-25 DIAGNOSIS — Z7901 Long term (current) use of anticoagulants: Secondary | ICD-10-CM | POA: Diagnosis not present

## 2024-02-25 DIAGNOSIS — E785 Hyperlipidemia, unspecified: Secondary | ICD-10-CM | POA: Insufficient documentation

## 2024-02-25 DIAGNOSIS — I4891 Unspecified atrial fibrillation: Secondary | ICD-10-CM

## 2024-02-25 MED ORDER — DILTIAZEM HCL 30 MG PO TABS: ORAL_TABLET | ORAL | 1 refills | Status: DC

## 2024-02-25 MED ORDER — APIXABAN 5 MG PO TABS: 5.0000 mg | ORAL_TABLET | Freq: Two times a day (BID) | ORAL | 5 refills | Status: DC

## 2024-02-25 NOTE — Patient Instructions (Addendum)
 Echocardiogram - scheduling will contact after insurance authorization

## 2024-02-25 NOTE — Progress Notes (Addendum)
 Primary Care Physician: Sheliah Hatch, MD Primary Cardiologist: None Electrophysiologist: None     Referring Physician: ED     Haskell Flirt Messinger is a 78 y.o. female with a history of sleep apnea on CPAP, HLD, trigeminal neuralgia, and atrial fibrillation who presents for consultation in the [facility]. ED visit on 02/19/24 for palpitations and SOB found to be in new Afib with RVR; discharged in NSR with diltiazem 30 mg PRN. Patient is on Eliquis 5 mg BID for a CHADS2VASC score of 3.  On evaluation today, she is currently in NSR. She notes an additional episode of Afib that occurred on 3/25; it resolved after diltiazem 30 mg PRN. She uses her CPAP regularly but she does note prior to ED visit she had the flu 3/5 and did not use her CPAP for the entire week due to not feeling well. She is back to using it regularly. She drinks 1-2 cups of coffee daily and rarely drinks alcohol. She has been compliant with Eliquis since ED visit. She has an Apple watch.  Today, she denies symptoms of chest pain, shortness of breath, orthopnea, PND, lower extremity edema, dizziness, presyncope, syncope, snoring, daytime somnolence, bleeding, or neurologic sequela. The patient is tolerating medications without difficulties and is otherwise without complaint today.    Atrial Fibrillation Risk Factors:  she does have symptoms or diagnosis of sleep apnea. she is compliant with CPAP therapy.   she has a BMI of Body mass index is 30.45 kg/m.Marland Kitchen Filed Weights   02/25/24 0928  Weight: 83 kg    Current Outpatient Medications  Medication Sig Dispense Refill   acetaminophen (TYLENOL) 325 MG tablet Take 650 mg by mouth as needed.     atorvastatin (LIPITOR) 10 MG tablet TAKE 1 TABLET BY MOUTH TWICE A WEEK 90 tablet 1   Calcium Carb-Cholecalciferol (CALCIUM 600-D PO) Take 600 mg by mouth in the morning and at bedtime.     cetirizine (ZYRTEC) 10 MG tablet TAKE 1 TABLET BY MOUTH  DAILY (Patient taking differently: Take 10 mg by mouth daily as needed for allergies.) 30 tablet 6   Cyanocobalamin 2000 MCG TBCR Take 2,000 mcg by mouth 2 (two) times a week.     fluticasone (FLONASE) 50 MCG/ACT nasal spray INSTILL 2 SPRAYS INTO EACH NOSTRIL DAILY (Patient taking differently: 2 sprays as needed.) 16 g 6   glucosamine-chondroitin 500-400 MG tablet Take 1 tablet by mouth 3 (three) times daily.     Halcinonide 0.1 % CREA Apply topically as needed.     Turmeric 400 MG CAPS Take 1 capsule by mouth as needed.     zolpidem (AMBIEN) 10 MG tablet TAKE 1 TABLET BY MOUTH EACH NIGHT AT BEDTIME AS NEEDED FOR SLEEP 30 tablet 3   apixaban (ELIQUIS) 5 MG TABS tablet Take 1 tablet (5 mg total) by mouth 2 (two) times daily. 60 tablet 5   diltiazem (CARDIZEM) 30 MG tablet Take 1 tablet every 4 hours AS NEEDED for AFIB heart rate >100 as long as top BP >100. 45 tablet 1   No current facility-administered medications for this encounter.    Atrial Fibrillation Management history:  Previous antiarrhythmic drugs: none Previous cardioversions: none Previous ablations: none Anticoagulation history: Eliquis 5 mg BID   ROS- All systems are reviewed and negative except as per the HPI above.  Physical Exam: BP 124/68   Pulse 68   Ht 5\' 5"  (1.651 m)   Wt 83 kg  BMI 30.45 kg/m   GEN: Well nourished, well developed in no acute distress NECK: No JVD; No carotid bruits CARDIAC: Regular rate and rhythm, no murmurs, rubs, gallops RESPIRATORY:  Clear to auscultation without rales, wheezing or rhonchi  ABDOMEN: Soft, non-tender, non-distended EXTREMITIES:  No edema; No deformity   EKG today demonstrates  Vent. rate 68 BPM PR interval 192 ms QRS duration 76 ms QT/QTcB 410/435 ms P-R-T axes 66 32 62 Normal sinus rhythm Normal ECG No significant change since last tracing Confirmed by Olga Millers ([id number]) on 02/25/2024 10:06:02 AM  Echo 2018 showed normal function  ASSESSMENT &  PLAN CHA2DS2-VASc Score = 3  The patient's score is based upon: CHF History: 0 HTN History: 0 Diabetes History: 0 Stroke History: 0 Vascular Disease History: 0 Age Score: 2 Gender Score: 1       ASSESSMENT AND PLAN: Paroxysmal Atrial Fibrillation (ICD10:  I48.0) The patient's CHA2DS2-VASc score is 3, indicating a 3.2% annual risk of stroke.    She is in NSR. Education provided about Afib. Discussion about triggers and rate vs rhythm control regarding medication treatments and ablation going forward if indicated. After discussion, we will proceed with conservative observation at this time. She would like to continue diltiazem 30 mg PRN and declines maintenance dose of diltiazem at this time. Continue monitoring rhythm via Apple watch. Order updated echocardiogram.   Secondary Hypercoagulable State (ICD10:  D68.69) The patient is at significant risk for stroke/thromboembolism based upon her CHA2DS2-VASc Score of 3.  Continue Apixaban (Eliquis).  Discussion about risks vs benefits of OAC and she wishes to continue Eliquis. Will require CBC in 1 month.    Follow up 1 month to reassess burden.    Lake Bells, PA-C  [facility] [address] [place], [place] [address] [phone]

## 2024-02-29 DIAGNOSIS — L738 Other specified follicular disorders: Secondary | ICD-10-CM | POA: Diagnosis not present

## 2024-02-29 DIAGNOSIS — Z85828 Personal history of other malignant neoplasm of skin: Secondary | ICD-10-CM | POA: Diagnosis not present

## 2024-02-29 DIAGNOSIS — H2513 Age-related nuclear cataract, bilateral: Secondary | ICD-10-CM | POA: Diagnosis not present

## 2024-02-29 DIAGNOSIS — D485 Neoplasm of uncertain behavior of skin: Secondary | ICD-10-CM | POA: Diagnosis not present

## 2024-02-29 DIAGNOSIS — L570 Actinic keratosis: Secondary | ICD-10-CM | POA: Diagnosis not present

## 2024-02-29 DIAGNOSIS — D692 Other nonthrombocytopenic purpura: Secondary | ICD-10-CM | POA: Diagnosis not present

## 2024-02-29 DIAGNOSIS — L821 Other seborrheic keratosis: Secondary | ICD-10-CM | POA: Diagnosis not present

## 2024-03-06 ENCOUNTER — Telehealth: Payer: Self-pay

## 2024-03-06 NOTE — Telephone Encounter (Signed)
 Copied from CRM [id number]. Topic: Clinical - Medication Question >> Mar 06, 2024 11:29 AM Drema Balzarine wrote: Reason for CRM: Morrie Sheldon from Opthamology requesting call back today for verbal consent for patient to stop her blood thinner today due to surgery coming up. Please call and ask for Morrie Sheldon at [phone].

## 2024-03-06 NOTE — Telephone Encounter (Signed)
 [facility] sent in a form requesting to hold the anticoagulant meds.  Has been labeled & placed in provider bin

## 2024-03-06 NOTE — Telephone Encounter (Signed)
 Called patient to relay Dr.Tabori's note, Patient was overwhelmed with the amount of things she has going on. I verbalized with patient I would call cardiology and relay the phone number and ophthalmology ladies name to call with the authorization for the Blood thinners. Left afib clinic a message to return my call.

## 2024-03-06 NOTE — Telephone Encounter (Signed)
 Okay to stop blood thinner today for a upcoming procedure ?

## 2024-03-06 NOTE — Telephone Encounter (Signed)
 I am not able to authorize her to hold her blood thinner.  This would need to come from the Afib Clinic (who she has seen recently)

## 2024-03-06 NOTE — Telephone Encounter (Signed)
 Faxed back to them informing them to contact cardiology

## 2024-03-17 ENCOUNTER — Ambulatory Visit (HOSPITAL_COMMUNITY)
Admission: RE | Admit: 2024-03-17 | Discharge: 2024-03-17 | Disposition: A | Discharge status: Home / Self Care | Source: Ambulatory Visit | Attending: Internal Medicine | Admitting: Internal Medicine

## 2024-03-17 DIAGNOSIS — I071 Rheumatic tricuspid insufficiency: Secondary | ICD-10-CM | POA: Diagnosis not present

## 2024-03-17 DIAGNOSIS — I4891 Unspecified atrial fibrillation: Secondary | ICD-10-CM | POA: Insufficient documentation

## 2024-03-17 LAB — ECHOCARDIOGRAM COMPLETE
AR max vel: 1.88 cm2
AV Area VTI: 2.05 cm2
AV Area mean vel: 2.01 cm2
AV Mean grad: 4 mmHg
AV Peak grad: 6.9 mmHg
Ao pk vel: 1.31 m/s
Area-P 1/2: 5.02 cm2
S' Lateral: 2.7 cm

## 2024-03-20 ENCOUNTER — Encounter (HOSPITAL_COMMUNITY): Payer: Self-pay

## 2024-03-28 ENCOUNTER — Ambulatory Visit (HOSPITAL_COMMUNITY)
Admission: RE | Admit: 2024-03-28 | Discharge: 2024-03-28 | Disposition: A | Discharge status: Home / Self Care | Source: Ambulatory Visit | Attending: Internal Medicine | Admitting: Internal Medicine

## 2024-03-28 VITALS — BP 126/68 | HR 59 | Ht 65.0 in | Wt 185.8 lb

## 2024-03-28 DIAGNOSIS — D6869 Other thrombophilia: Secondary | ICD-10-CM

## 2024-03-28 DIAGNOSIS — I4891 Unspecified atrial fibrillation: Secondary | ICD-10-CM

## 2024-03-28 DIAGNOSIS — I480 Paroxysmal atrial fibrillation: Secondary | ICD-10-CM

## 2024-03-28 NOTE — Progress Notes (Signed)
 Primary Care Physician: Laura Morita, MD Primary Cardiologist: None Electrophysiologist: None     Referring Physician: ED     Laura Stuart is a 78 y.o. female with a history of sleep apnea on CPAP, HLD, trigeminal neuralgia, and atrial fibrillation who presents for consultation in the [facility]. ED visit on 02/19/24 for palpitations and SOB found to be in new Afib with RVR; discharged in NSR with diltiazem  30 mg PRN. Patient is on Eliquis  5 mg BID for a CHADS2VASC score of 3.  On evaluation today, she is currently in NSR. She notes an additional episode of Afib that occurred on 3/25; it resolved after diltiazem  30 mg PRN. She uses her CPAP regularly but she does note prior to ED visit she had the flu 3/5 and did not use her CPAP for the entire week due to not feeling well. She is back to using it regularly. She drinks 1-2 cups of coffee daily and rarely drinks alcohol. She has been compliant with Eliquis  since ED visit. She has an Apple watch.  On follow up 03/28/24, she is currently in NSR. She has had no episodes of Afib since last office visit. She has been doing well overall. She noted one episode of dizziness that was brief but did not check HR or BP at that time; no recurrence.   Today, she denies symptoms of chest pain, shortness of breath, orthopnea, PND, lower extremity edema, dizziness, presyncope, syncope, snoring, daytime somnolence, bleeding, or neurologic sequela. The patient is tolerating medications without difficulties and is otherwise without complaint today.    Atrial Fibrillation Risk Factors:  she does have symptoms or diagnosis of sleep apnea. she is compliant with CPAP therapy.   she has a BMI of Body mass index is 30.92 kg/m.Laura Stuart Filed Weights   03/28/24 1014  Weight: 84.3 kg     Current Outpatient Medications  Medication Sig Dispense Refill   acetaminophen  (TYLENOL ) 325 MG tablet Take 650 mg by mouth as needed.      apixaban  (ELIQUIS ) 5 MG TABS tablet Take 1 tablet (5 mg total) by mouth 2 (two) times daily. 60 tablet 5   atorvastatin  (LIPITOR) 10 MG tablet TAKE 1 TABLET BY MOUTH TWICE A WEEK 90 tablet 1   Calcium  Carb-Cholecalciferol (CALCIUM  600-D PO) Take 600 mg by mouth in the morning and at bedtime.     cetirizine  (ZYRTEC ) 10 MG tablet TAKE 1 TABLET BY MOUTH DAILY (Patient taking differently: Take 10 mg by mouth daily as needed for allergies.) 30 tablet 6   Cyanocobalamin 2000 MCG TBCR Take 2,000 mcg by mouth 2 (two) times a week.     diltiazem  (CARDIZEM ) 30 MG tablet Take 1 tablet every 4 hours AS NEEDED for AFIB heart rate >100 as long as top BP >100. 45 tablet 1   fluticasone  (FLONASE ) 50 MCG/ACT nasal spray INSTILL 2 SPRAYS INTO EACH NOSTRIL DAILY (Patient taking differently: 2 sprays as needed.) 16 g 6   glucosamine-chondroitin 500-400 MG tablet Take 1 tablet by mouth 3 (three) times daily.     Halcinonide  0.1 % CREA Apply topically as needed.     Turmeric 400 MG CAPS Take 1 capsule by mouth as needed.     zolpidem  (AMBIEN ) 10 MG tablet TAKE 1 TABLET BY MOUTH EACH NIGHT AT BEDTIME AS NEEDED FOR SLEEP 30 tablet 3   No current facility-administered medications for this encounter.    Atrial Fibrillation Management history:  Previous antiarrhythmic drugs: none  Previous cardioversions: none Previous ablations: none Anticoagulation history: Eliquis  5 mg BID   ROS- All systems are reviewed and negative except as per the HPI above.  Physical Exam: BP 126/68   Pulse (!) 59   Ht 5\' 5"  (1.651 m)   Wt 84.3 kg   BMI 30.92 kg/m   GEN- The patient is well appearing, alert and oriented x 3 today.   Neck - no JVD or carotid bruit noted Lungs- Clear to ausculation bilaterally, normal work of breathing Heart- Regular rate and rhythm, no murmurs, rubs or gallops, PMI not laterally displaced Extremities- no clubbing, cyanosis, or edema Skin - no rash or ecchymosis noted   EKG today demonstrates   Vent. rate 59 BPM PR interval 198 ms QRS duration 84 ms QT/QTcB 416/411 ms P-R-T axes 69 26 51 Sinus bradycardia When compared with ECG of 25-Feb-2024 09:45, No significant change was found Confirmed by Laura Stuart ([id number]) on 03/28/2024 10:33:23 AM  Echo 03/17/24: 1. Left ventricular ejection fraction, by estimation, is 55 to 60%. The  left ventricle has normal function. The left ventricle has no regional  wall motion abnormalities. Left ventricular diastolic parameters were  normal.   2. Right ventricular systolic function is normal. The right ventricular  size is normal.   3. The mitral valve is normal in structure. Trivial mitral valve  regurgitation. No evidence of mitral stenosis.   4. The aortic valve is normal in structure. Aortic valve regurgitation is  not visualized. No aortic stenosis is present.   5. The inferior vena cava is normal in size with greater than 50%  respiratory variability, suggesting right atrial pressure of 3 mmHg.    ASSESSMENT & PLAN CHA2DS2-VASc Score = 3  The patient's score is based upon: CHF History: 0 HTN History: 0 Diabetes History: 0 Stroke History: 0 Vascular Disease History: 0 Age Score: 2 Gender Score: 1       ASSESSMENT AND PLAN: Paroxysmal Atrial Fibrillation (ICD10:  I48.0) The patient's CHA2DS2-VASc score is 3, indicating a 3.2% annual risk of stroke.    She is currently in NSR. She is doing well with low burden overall. Continue conservative observation with Apple watch.    Secondary Hypercoagulable State (ICD10:  D68.69) The patient is at significant risk for stroke/thromboembolism based upon her CHA2DS2-VASc Score of 3.  Continue Apixaban  (Eliquis ).  Continue Eliquis  5 mg BID. CBC today.    Follow up 1 year Afib clinic.    Laura Amber, PA-C  Af[facility] Clinic [facility] [address] [place], [place] [address] [phone]

## 2024-03-29 LAB — CBC
Hematocrit: 40.2 % (ref 34.0–46.6)
Hemoglobin: 13.3 g/dL (ref 11.1–15.9)
MCH: 30 pg (ref 26.6–33.0)
MCHC: 33.1 g/dL (ref 31.5–35.7)
MCV: 91 fL (ref 79–97)
Platelets: 251 10*3/uL (ref 150–450)
RBC: 4.43 x10E6/uL (ref 3.77–5.28)
RDW: 13.5 % (ref 11.7–15.4)
WBC: 5.5 10*3/uL (ref 3.4–10.8)

## 2024-04-10 ENCOUNTER — Ambulatory Visit: Payer: Medicare Other | Admitting: Family Medicine

## 2024-04-10 ENCOUNTER — Encounter: Payer: Self-pay | Admitting: Family Medicine

## 2024-04-10 VITALS — BP 130/64 | HR 69 | Temp 98.0°F | Ht 65.0 in | Wt 184.0 lb

## 2024-04-10 DIAGNOSIS — I10 Essential (primary) hypertension: Secondary | ICD-10-CM | POA: Diagnosis not present

## 2024-04-10 DIAGNOSIS — E785 Hyperlipidemia, unspecified: Secondary | ICD-10-CM

## 2024-04-10 DIAGNOSIS — Z780 Asymptomatic menopausal state: Secondary | ICD-10-CM

## 2024-04-10 DIAGNOSIS — E669 Obesity, unspecified: Secondary | ICD-10-CM | POA: Diagnosis not present

## 2024-04-10 DIAGNOSIS — Z9989 Dependence on other enabling machines and devices: Secondary | ICD-10-CM | POA: Insufficient documentation

## 2024-04-10 HISTORY — DX: Essential (primary) hypertension: I10

## 2024-04-10 LAB — BASIC METABOLIC PANEL WITH GFR
BUN: 24 mg/dL — ABNORMAL HIGH (ref 6–23)
CO2: 29 meq/L (ref 19–32)
Calcium: 9.9 mg/dL (ref 8.4–10.5)
Chloride: 102 meq/L (ref 96–112)
Creatinine, Ser: 0.8 mg/dL (ref 0.40–1.20)
GFR: 70.8 mL/min (ref >60.00)
Glucose, Bld: 93 mg/dL (ref 70–99)
Potassium: 4.5 meq/L (ref 3.5–5.1)
Sodium: 139 meq/L (ref 135–145)

## 2024-04-10 LAB — LIPID PANEL
Cholesterol: 195 mg/dL (ref 0–200)
HDL: 67.3 mg/dL (ref >39.00)
LDL Cholesterol: 111 mg/dL — ABNORMAL HIGH (ref 0–99)
NonHDL: 127.9
Total CHOL/HDL Ratio: 3
Triglycerides: 86 mg/dL (ref 0.0–149.0)
VLDL: 17.2 mg/dL (ref 0.0–40.0)

## 2024-04-10 LAB — HEPATIC FUNCTION PANEL
ALT: 20 U/L (ref 0–35)
AST: 28 U/L (ref 0–37)
Albumin: 4.3 g/dL (ref 3.5–5.2)
Alkaline Phosphatase: 68 U/L (ref 39–117)
Bilirubin, Direct: 0.1 mg/dL (ref 0.0–0.3)
Total Bilirubin: 0.6 mg/dL (ref 0.2–1.2)
Total Protein: 7 g/dL (ref 6.0–8.3)

## 2024-04-10 LAB — CBC WITH DIFFERENTIAL/PLATELET
Basophils Absolute: 0.1 10*3/uL (ref 0.0–0.1)
Basophils Relative: 1.3 % (ref 0.0–3.0)
Eosinophils Absolute: 0.1 10*3/uL (ref 0.0–0.7)
Eosinophils Relative: 2.3 % (ref 0.0–5.0)
HCT: 40.9 % (ref 36.0–46.0)
Hemoglobin: 13.6 g/dL (ref 12.0–15.0)
Lymphocytes Relative: 25.5 % (ref 12.0–46.0)
Lymphs Abs: 1.4 10*3/uL (ref 0.7–4.0)
MCHC: 33.2 g/dL (ref 30.0–36.0)
MCV: 90 fl (ref 78.0–100.0)
Monocytes Absolute: 0.5 10*3/uL (ref 0.1–1.0)
Monocytes Relative: 8.5 % (ref 3.0–12.0)
Neutro Abs: 3.3 10*3/uL (ref 1.4–7.7)
Neutrophils Relative %: 62.4 % (ref 43.0–77.0)
Platelets: 260 10*3/uL (ref 150.0–400.0)
RBC: 4.55 Mil/uL (ref 3.87–5.11)
RDW: 14.3 % (ref 11.5–15.5)
WBC: 5.3 10*3/uL (ref 4.0–10.5)

## 2024-04-10 LAB — TSH: TSH: 4.38 u[IU]/mL (ref 0.35–5.50)

## 2024-04-10 NOTE — Assessment & Plan Note (Signed)
 Ongoing issue for pt.  BMI is stable at 30.6.  She admits she is not exercising regularly.  Encouraged low carb diet and regular physical activity.  Will follow.

## 2024-04-10 NOTE — Assessment & Plan Note (Signed)
 Chronic problem.  On Lipitor 10mg  daily w/o difficulty.  Check labs.  Adjust meds prn

## 2024-04-10 NOTE — Patient Instructions (Addendum)
 Schedule your complete physical in 6 months We'll notify you of your lab results and make any changes if needed Continue to work on healthy diet and regular exercise- you can do it! We'll call you to schedule your bone density Call with any questions or concerns Stay Safe!  Stay Healthy! SAFE TRAVELS!!!  HAVE FUN!!

## 2024-04-10 NOTE — Assessment & Plan Note (Signed)
 Chronic problem.  BP is adequately controlled today w/o medication.  Currently asymptomatic.  Will continue to follow.

## 2024-04-10 NOTE — Progress Notes (Signed)
   Subjective:    Patient ID: Laura Stuart, female    DOB: 1946/08/15, 78 y.o.   MRN: [id number]  HPI Hyperlipidemia- chronic problem, on Lipitor 10mg  daily.  No abd pain, N/V.  HTN- pt has hx of HTN but not currently on medication.  BP 130/64 today.  No CP, SOB, HA's, visual changes, edema.  Obesity- pt is eating well but admits she is not exercising.  Weight is stable, BMI 30.6   Review of Systems For ROS see HPI     Objective:   Physical Exam Vitals reviewed.  Constitutional:      General: She is not in acute distress.    Appearance: Normal appearance. She is well-developed. She is not ill-appearing.  HENT:     Head: Normocephalic and atraumatic.  Eyes:     Conjunctiva/sclera: Conjunctivae normal.     Pupils: Pupils are equal, round, and reactive to light.  Neck:     Thyroid : No thyromegaly.  Cardiovascular:     Rate and Rhythm: Normal rate and regular rhythm.     Pulses: Normal pulses.     Heart sounds: Normal heart sounds. No murmur heard. Pulmonary:     Effort: Pulmonary effort is normal. No respiratory distress.     Breath sounds: Normal breath sounds.  Abdominal:     General: There is no distension.     Palpations: Abdomen is soft.     Tenderness: There is no abdominal tenderness.  Musculoskeletal:     Cervical back: Normal range of motion and neck supple.     Right lower leg: No edema.     Left lower leg: No edema.  Lymphadenopathy:     Cervical: No cervical adenopathy.  Skin:    General: Skin is warm and dry.  Neurological:     General: No focal deficit present.     Mental Status: She is alert and oriented to person, place, and time.  Psychiatric:        Mood and Affect: Mood normal.        Behavior: Behavior normal.        Thought Content: Thought content normal.           Assessment & Plan:

## 2024-04-11 ENCOUNTER — Ambulatory Visit: Payer: Self-pay | Admitting: Family Medicine

## 2024-04-11 NOTE — Telephone Encounter (Signed)
 Pt has been notified.

## 2024-04-11 NOTE — Telephone Encounter (Signed)
-----   Message from Laymon Priest sent at 04/11/2024  8:50 AM EDT ----- Labs look great!  No changes at this time

## 2024-05-31 DIAGNOSIS — K080 Exfoliation of teeth due to systemic causes: Secondary | ICD-10-CM | POA: Diagnosis not present

## 2024-06-14 DIAGNOSIS — H25812 Combined forms of age-related cataract, left eye: Secondary | ICD-10-CM | POA: Diagnosis not present

## 2024-06-14 DIAGNOSIS — H2512 Age-related nuclear cataract, left eye: Secondary | ICD-10-CM | POA: Diagnosis not present

## 2024-06-15 ENCOUNTER — Other Ambulatory Visit

## 2024-06-22 ENCOUNTER — Other Ambulatory Visit

## 2024-06-22 DIAGNOSIS — Z006 Encounter for examination for normal comparison and control in clinical research program: Secondary | ICD-10-CM

## 2024-06-28 DIAGNOSIS — H2511 Age-related nuclear cataract, right eye: Secondary | ICD-10-CM | POA: Diagnosis not present

## 2024-07-08 LAB — GENECONNECT MOLECULAR SCREEN: Genetic Analysis Overall Interpretation: NEGATIVE

## 2024-07-17 ENCOUNTER — Other Ambulatory Visit (HOSPITAL_COMMUNITY): Payer: Self-pay | Admitting: *Deleted

## 2024-07-17 MED ORDER — APIXABAN 5 MG PO TABS: 5.0000 mg | ORAL_TABLET | Freq: Two times a day (BID) | ORAL | 6 refills | Status: AC

## 2024-08-04 DIAGNOSIS — Z23 Encounter for immunization: Secondary | ICD-10-CM | POA: Diagnosis not present

## 2024-08-31 ENCOUNTER — Other Ambulatory Visit (HOSPITAL_COMMUNITY): Payer: Self-pay | Admitting: *Deleted

## 2024-08-31 MED ORDER — DILTIAZEM HCL 30 MG PO TABS: ORAL_TABLET | ORAL | 1 refills | Status: AC

## 2024-09-01 ENCOUNTER — Encounter: Payer: Self-pay | Admitting: Family Medicine

## 2024-09-01 DIAGNOSIS — Z780 Asymptomatic menopausal state: Secondary | ICD-10-CM

## 2024-09-04 ENCOUNTER — Telehealth: Payer: Self-pay | Admitting: Family Medicine

## 2024-09-04 NOTE — Telephone Encounter (Signed)
 Type of form received: Bone Density Order  Additional comments:   Received by: Fax  Form should be Faxed/mailed to: (address/ fax #) [phone]  Is patient requesting call for pickup: N/A  Form placed:  Labeled & placed in provider bin  Attach charge sheet.  Provider will determine charge.  Individual made aware of 3-5 business day turn around? N/A

## 2024-09-05 DIAGNOSIS — G4733 Obstructive sleep apnea (adult) (pediatric): Secondary | ICD-10-CM | POA: Diagnosis not present

## 2024-09-05 NOTE — Telephone Encounter (Signed)
Signed and faxed. Placed in scan bin.

## 2024-09-06 ENCOUNTER — Other Ambulatory Visit: Payer: Self-pay | Admitting: Family Medicine

## 2024-09-26 ENCOUNTER — Ambulatory Visit: Payer: Self-pay

## 2024-09-26 NOTE — Telephone Encounter (Signed)
 FYI - patient has appt with you tomorrow  Pt returned last night from [place] - feet started swelling while on trip. Pt has kept feet elevated, swelling has reduced some - but not completely resolved. Pt also had a fall on Sunday and hit her head. Pt went to ED in [place]. They performed an MRI and found nothing concerning. Pt has not dizziness, HA, bruise or swelling on back of her near neck from fall.

## 2024-09-26 NOTE — Telephone Encounter (Signed)
 FYI Only or Action Required?: FYI only for provider.  Patient was last seen in primary care on 04/10/2024 by Mahlon Comer BRAVO, MD.  Called Nurse Triage reporting Leg Swelling and fall  Symptoms began several weeks ago.  Interventions attempted: Other: Seen at ED in [place] for fall.  Symptoms are: gradually improving.  Triage Disposition: See PCP When Office is Open (Within 3 Days)  Patient/caregiver understands and will follow disposition?: Yes - appt scheduled for tomorrow morning.                         Copied from CRM [id number]. Topic: Clinical - Red Word Triage >> Sep 26, 2024  8:50 AM Berneda FALCON wrote: Red Word that prompted transfer to Nurse Triage: Out of the country for 2 weeks-got back last night. Feet swollen in lower leg-not sure what caused it but could be from walking-has not improved. Went to ED on Sunday. Swelling is in both feet, and just above the ankle. There is no visible redness or heat coming from it. States it is itchy but just feels stretched and abnormal.  Swelling began 2 weeks ago Reason for Disposition  [1] MILD swelling of both ankles (e.g., ankle joints look swollen; or bilateral mild pedal edema) AND [2] new-onset or getting worse  (Exceptions: Caused by hot weather, already seen by doctor or NP/PA for this.)  Answer Assessment - Initial Assessment Questions Pt returned last night from [place] - feet started swelling while on trip. Pt has kept feet elevated, swelling has reduced some  - but not completely resolved. Pt also had a fall on Sunday and hit her head. Pt went to ED in [place]. They performed an MRI and found nothing concerning. Pt has not dizziness, HA, bruise or swelling on back of her near neck from fall.  1. LOCATION: Which ankle is swollen? Where is the swelling?     both 2. ONSET: When did the swelling start?     2 weeks - have gone down a little. At the worst  - skin was stretched.  3. SWELLING:  How bad is the swelling? Or, How large is it? (e.g., mild, moderate, severe; size of localized swelling)      Mild /moderate 4. PAIN: Is there any pain? If Yes, ask: How bad is it? (Scale 0-10; or none, mild, moderate, severe)     Tender, sore  5. CAUSE: What do you think caused the ankle swelling?     Walking a lot over the past 2 weeks 6. OTHER SYMPTOMS: Do you have any other symptoms? (e.g., fever, chest pain, difficulty breathing, calf pain)     Skin is itchy, soreness  Protocols used: Ankle Swelling-A-AH

## 2024-09-27 ENCOUNTER — Ambulatory Visit (INDEPENDENT_AMBULATORY_CARE_PROVIDER_SITE_OTHER): Admitting: Family Medicine

## 2024-09-27 ENCOUNTER — Encounter: Payer: Self-pay | Admitting: Family Medicine

## 2024-09-27 VITALS — BP 128/70 | HR 74 | Temp 98.0°F | Ht 65.0 in | Wt 191.1 lb

## 2024-09-27 DIAGNOSIS — R600 Localized edema: Secondary | ICD-10-CM | POA: Diagnosis not present

## 2024-09-27 LAB — CBC WITH DIFFERENTIAL/PLATELET
Basophils Absolute: 0 K/uL (ref 0.0–0.1)
Basophils Relative: 0.8 % (ref 0.0–3.0)
Eosinophils Absolute: 0.1 K/uL (ref 0.0–0.7)
Eosinophils Relative: 2.2 % (ref 0.0–5.0)
HCT: 40 % (ref 36.0–46.0)
Hemoglobin: 13.2 g/dL (ref 12.0–15.0)
Lymphocytes Relative: 19.9 % (ref 12.0–46.0)
Lymphs Abs: 1.2 K/uL (ref 0.7–4.0)
MCHC: 32.9 g/dL (ref 30.0–36.0)
MCV: 90.3 fl (ref 78.0–100.0)
Monocytes Absolute: 0.5 K/uL (ref 0.1–1.0)
Monocytes Relative: 7.8 % (ref 3.0–12.0)
Neutro Abs: 4.1 K/uL (ref 1.4–7.7)
Neutrophils Relative %: 69.3 % (ref 43.0–77.0)
Platelets: 292 K/uL (ref 150.0–400.0)
RBC: 4.44 Mil/uL (ref 3.87–5.11)
RDW: 14.7 % (ref 11.5–15.5)
WBC: 5.9 K/uL (ref 4.0–10.5)

## 2024-09-27 LAB — BASIC METABOLIC PANEL WITH GFR
BUN: 20 mg/dL (ref 6–23)
CO2: 30 meq/L (ref 19–32)
Calcium: 9.5 mg/dL (ref 8.4–10.5)
Chloride: 106 meq/L (ref 96–112)
Creatinine, Ser: 0.8 mg/dL (ref 0.40–1.20)
GFR: 70.57 mL/min (ref >60.00)
Glucose, Bld: 95 mg/dL (ref 70–99)
Potassium: 5.1 meq/L (ref 3.5–5.1)
Sodium: 142 meq/L (ref 135–145)

## 2024-09-27 LAB — HEPATIC FUNCTION PANEL
ALT: 20 U/L (ref 0–35)
AST: 23 U/L (ref 0–37)
Albumin: 4.1 g/dL (ref 3.5–5.2)
Alkaline Phosphatase: 79 U/L (ref 39–117)
Bilirubin, Direct: 0.2 mg/dL (ref 0.0–0.3)
Total Bilirubin: 0.5 mg/dL (ref 0.2–1.2)
Total Protein: 6.7 g/dL (ref 6.0–8.3)

## 2024-09-27 LAB — BRAIN NATRIURETIC PEPTIDE: Pro B Natriuretic peptide (BNP): 56 pg/mL (ref 0.0–100.0)

## 2024-09-27 LAB — TSH: TSH: 2.38 u[IU]/mL (ref 0.35–5.50)

## 2024-09-27 MED ORDER — FUROSEMIDE 20 MG PO TABS: 20.0000 mg | ORAL_TABLET | Freq: Every day | ORAL | 3 refills | Status: DC | PRN

## 2024-09-27 NOTE — Progress Notes (Unsigned)
   Subjective:    Patient ID: Laura Stuart, female    DOB: 1946-04-18, 78 y.o.   MRN: [id number]  HPI LE edema- pt went on a 'pilgrimage' to [place].  Did a ton of walking.  While gone.  Sxs started 2 weeks ago after prolonged standing the day before.  Diet was different than when at home.  Denies CP/tightness, SOB.  Did have one short lived episode of afib.  No swelling of hands.  Swelling extends to mid-calf.  Just got back Monday night.   Review of Systems For ROS see HPI     Objective:   Physical Exam Vitals reviewed.  Constitutional:      General: She is not in acute distress.    Appearance: Normal appearance. She is well-developed. She is not ill-appearing.  HENT:     Head: Normocephalic and atraumatic.  Eyes:     Conjunctiva/sclera: Conjunctivae normal.     Pupils: Pupils are equal, round, and reactive to light.  Neck:     Thyroid : No thyromegaly.  Cardiovascular:     Rate and Rhythm: Normal rate and regular rhythm.     Pulses: Normal pulses.     Heart sounds: Normal heart sounds. No murmur heard. Pulmonary:     Effort: Pulmonary effort is normal. No respiratory distress.     Breath sounds: Normal breath sounds.  Abdominal:     General: There is no distension.     Palpations: Abdomen is soft.     Tenderness: There is no abdominal tenderness.  Musculoskeletal:     Cervical back: Normal range of motion and neck supple.     Right lower leg: Edema (1+ pitting edema to mid shin) present.     Left lower leg: Edema (1+ pitting edema to mid shin) present.  Lymphadenopathy:     Cervical: No cervical adenopathy.  Skin:    General: Skin is warm and dry.  Neurological:     Mental Status: She is alert and oriented to person, place, and time.  Psychiatric:        Behavior: Behavior normal.           Assessment & Plan:  LE edema- new.  Suspect a combination of factors- recent travel, increased salt intake, increase walking.  Will check BNP to r/o heart strain.   Check CBC to assess for anemia, TSH to r/o hypothyroid, BMP to lok for electrolyte abnormalities.  Encouraged low sodium diet, increased water intake, and elevation of legs.  Start Lasix 20mg  prn.  Pt expressed understanding and is in agreement w/ plan.

## 2024-09-27 NOTE — Patient Instructions (Signed)
 Follow up as needed or as scheduled We'll notify you of your lab results and make any changes if needed TAKE the furosemide (fluid pill) as needed for swelling Drink plenty of water Elevate your legs when sitting Call with any questions or concerns Happy Halloween!!

## 2024-09-28 ENCOUNTER — Ambulatory Visit: Payer: Self-pay | Admitting: Family Medicine

## 2024-09-28 NOTE — Progress Notes (Signed)
 Pt has been notified.

## 2024-10-10 DIAGNOSIS — M85851 Other specified disorders of bone density and structure, right thigh: Secondary | ICD-10-CM | POA: Diagnosis not present

## 2024-10-10 LAB — HM DEXA SCAN

## 2024-10-11 ENCOUNTER — Encounter: Payer: Self-pay | Admitting: Family Medicine

## 2024-10-17 ENCOUNTER — Encounter: Admitting: Family Medicine

## 2024-11-03 ENCOUNTER — Ambulatory Visit (HOSPITAL_COMMUNITY): Admitting: Internal Medicine

## 2024-11-07 ENCOUNTER — Ambulatory Visit (HOSPITAL_COMMUNITY): Admission: RE | Admit: 2024-11-07 | Source: Ambulatory Visit | Admitting: Internal Medicine

## 2024-11-07 NOTE — Progress Notes (Incomplete)
 Primary Care Physician: Mahlon Comer BRAVO, MD Primary Cardiologist: None Electrophysiologist: None     Referring Physician: ED     Laura Stuart is a 78 y.o. female with a history of sleep apnea on CPAP, HLD, trigeminal neuralgia, and atrial fibrillation who presents for consultation in the [facility]. ED visit on 02/19/24 for palpitations and SOB found to be in new Afib with RVR; discharged in NSR with diltiazem  30 mg PRN. Patient is on Eliquis  5 mg BID for a CHADS2VASC score of 3.  On evaluation today, she is currently in NSR. She notes an additional episode of Afib that occurred on 3/25; it resolved after diltiazem  30 mg PRN. She uses her CPAP regularly but she does note prior to ED visit she had the flu 3/5 and did not use her CPAP for the entire week due to not feeling well. She is back to using it regularly. She drinks 1-2 cups of coffee daily and rarely drinks alcohol. She has been compliant with Eliquis  since ED visit. She has an Apple watch.  On follow up 03/28/24, she is currently in NSR. She has had no episodes of Afib since last office visit. She has been doing well overall. She noted one episode of dizziness that was brief but did not check HR or BP at that time; no recurrence.   On follow-up 11/07/2024, patient is currently in***.  She has noticed increased A-fib burden over the past***months.  No missed doses of Eliquis .  Today, she denies symptoms of chest pain, shortness of breath, orthopnea, PND, lower extremity edema, dizziness, presyncope, syncope, snoring, daytime somnolence, bleeding, or neurologic sequela. The patient is tolerating medications without difficulties and is otherwise without complaint today.    Atrial Fibrillation Risk Factors:  she does have symptoms or diagnosis of sleep apnea. she is compliant with CPAP therapy.   she has a BMI of There is no height or weight on file to calculate BMI.. There were no vitals filed  for this visit.    Current Outpatient Medications  Medication Sig Dispense Refill   acetaminophen  (TYLENOL ) 325 MG tablet Take 650 mg by mouth as needed.     apixaban  (ELIQUIS ) 5 MG TABS tablet Take 1 tablet (5 mg total) by mouth 2 (two) times daily. 60 tablet 6   atorvastatin  (LIPITOR) 10 MG tablet TAKE 1 TABLET BY MOUTH TWICE A WEEK 90 tablet 1   Calcium  Carb-Cholecalciferol (CALCIUM  600-D PO) Take 600 mg by mouth in the morning and at bedtime.     cetirizine  (ZYRTEC ) 10 MG tablet TAKE 1 TABLET BY MOUTH DAILY (Patient taking differently: Take 10 mg by mouth daily as needed for allergies.) 30 tablet 6   COMIRNATY syringe      Cyanocobalamin 2000 MCG TBCR Take 2,000 mcg by mouth 2 (two) times a week.     diltiazem  (CARDIZEM ) 30 MG tablet Take 1 tablet every 4 hours AS NEEDED for AFIB heart rate >100 as long as top BP >100. 45 tablet 1   fluticasone  (FLONASE ) 50 MCG/ACT nasal spray INSTILL 2 SPRAYS INTO EACH NOSTRIL DAILY 16 g 6   furosemide  (LASIX ) 20 MG tablet Take 1 tablet (20 mg total) by mouth daily as needed. 30 tablet 3   glucosamine-chondroitin 500-400 MG tablet Take 1 tablet by mouth 3 (three) times daily.     Halcinonide  0.1 % CREA Apply topically as needed.     Turmeric 400 MG CAPS Take 1 capsule by mouth as  needed.     zolpidem  (AMBIEN ) 10 MG tablet Take 10 mg by mouth at bedtime as needed.     No current facility-administered medications for this visit.    Atrial Fibrillation Management history:  Previous antiarrhythmic drugs: none Previous cardioversions: none Previous ablations: none Anticoagulation history: Eliquis  5 mg BID   ROS- All systems are reviewed and negative except as per the HPI above.  Physical Exam: There were no vitals taken for this visit.  GEN- The patient is well appearing, alert and oriented x 3 today.   Neck - no JVD or carotid bruit noted Lungs- Clear to ausculation bilaterally, normal work of breathing Heart- ***Regular rate and rhythm, no  murmurs, rubs or gallops, PMI not laterally displaced Extremities- no clubbing, cyanosis, or edema Skin - no rash or ecchymosis noted    EKG today demonstrates  ***  Echo 03/17/24: 1. Left ventricular ejection fraction, by estimation, is 55 to 60%. The  left ventricle has normal function. The left ventricle has no regional  wall motion abnormalities. Left ventricular diastolic parameters were  normal.   2. Right ventricular systolic function is normal. The right ventricular  size is normal.   3. The mitral valve is normal in structure. Trivial mitral valve  regurgitation. No evidence of mitral stenosis.   4. The aortic valve is normal in structure. Aortic valve regurgitation is  not visualized. No aortic stenosis is present.   5. The inferior vena cava is normal in size with greater than 50%  respiratory variability, suggesting right atrial pressure of 3 mmHg.    ASSESSMENT & PLAN CHA2DS2-VASc Score = 3  The patient's score is based upon: CHF History: 0 HTN History: 0 Diabetes History: 0 Stroke History: 0 Vascular Disease History: 0 Age Score: 2 Gender Score: 1       ASSESSMENT AND PLAN: Paroxysmal Atrial Fibrillation (ICD10:  I48.0) The patient's CHA2DS2-VASc score is 3, indicating a 3.2% annual risk of stroke.    Patient is currently in ***. Multaq, tikosyn, amio, ablation  [phone]   Secondary Hypercoagulable State (ICD10:  T1760941) The patient is at significant risk for stroke/thromboembolism based upon her CHA2DS2-VASc Score of 3.  Continue Apixaban  (Eliquis ).  Continue Eliquis  5 mg BID. CBC today.     Follow up 1 year Afib clinic.    Terra Pac, PA-C  Afib Clinic [facility] [address] [place], [place] [address] [phone]

## 2024-11-24 ENCOUNTER — Ambulatory Visit (HOSPITAL_COMMUNITY)
Admission: RE | Admit: 2024-11-24 | Discharge: 2024-11-24 | Disposition: A | Discharge status: Home / Self Care | Source: Ambulatory Visit | Attending: Internal Medicine | Admitting: Internal Medicine

## 2024-11-24 VITALS — BP 124/64 | HR 64 | Ht 65.0 in | Wt 188.6 lb

## 2024-11-24 DIAGNOSIS — I480 Paroxysmal atrial fibrillation: Secondary | ICD-10-CM

## 2024-11-24 DIAGNOSIS — D6869 Other thrombophilia: Secondary | ICD-10-CM

## 2024-11-24 DIAGNOSIS — I4891 Unspecified atrial fibrillation: Secondary | ICD-10-CM

## 2024-11-24 MED ORDER — DILTIAZEM HCL ER COATED BEADS 120 MG PO CP24: 120.0000 mg | ORAL_CAPSULE | Freq: Every day | ORAL | 2 refills | Status: AC

## 2024-11-24 NOTE — Patient Instructions (Signed)
 Start   diltiazem  (CARDIZEM  CD) 120 MG once daily

## 2024-11-24 NOTE — Progress Notes (Signed)
 "   Primary Care Physician: Mahlon Comer BRAVO, MD Primary Cardiologist: None Electrophysiologist: None     Referring Physician: ED     Laura Stuart is a 78 y.o. female with a history of sleep apnea on CPAP, HLD, trigeminal neuralgia, and atrial fibrillation who presents for consultation in the [facility]. ED visit on 02/19/24 for palpitations and SOB found to be in new Afib with RVR; discharged in NSR with diltiazem  30 mg PRN. Patient is on Eliquis  5 mg BID for a CHADS2VASC score of 3.  On evaluation today, she is currently in NSR. She notes an additional episode of Afib that occurred on 3/25; it resolved after diltiazem  30 mg PRN. She uses her CPAP regularly but she does note prior to ED visit she had the flu 3/5 and did not use her CPAP for the entire week due to not feeling well. She is back to using it regularly. She drinks 1-2 cups of coffee daily and rarely drinks alcohol. She has been compliant with Eliquis  since ED visit. She has an Apple watch.  On follow up 03/28/24, she is currently in NSR. She has had no episodes of Afib since last office visit. She has been doing well overall. She noted one episode of dizziness that was brief but did not check HR or BP at that time; no recurrence.   On follow-up 11/24/2024, patient is currently in NSR.  She has noticed brief episodes of paroxysmal A-fib over the past several months.  Specifically, she notes 2 episodes in August, 2 episodes in September, 1 episode in October, 3 episodes in November, and 2 episodes in December this month.  They are all brief and respond successfully to diltiazem  30 mg as needed.  She can feel the palpitations but overall does not feel terrible when out of rhythm.  She uses an Apple watch to help monitor rhythm.  No missed doses of Eliquis .  Today, she denies symptoms of chest pain, shortness of breath, orthopnea, PND, lower extremity edema, dizziness, presyncope, syncope, snoring,  daytime somnolence, bleeding, or neurologic sequela. The patient is tolerating medications without difficulties and is otherwise without complaint today.    Atrial Fibrillation Risk Factors:  she does have symptoms or diagnosis of sleep apnea. she is compliant with CPAP therapy.   she has a BMI of Body mass index is 31.38 kg/m.SABRA Filed Weights   11/24/24 1057  Weight: 85.5 kg      Current Outpatient Medications  Medication Sig Dispense Refill   acetaminophen  (TYLENOL ) 325 MG tablet Take 650 mg by mouth as needed.     apixaban  (ELIQUIS ) 5 MG TABS tablet Take 1 tablet (5 mg total) by mouth 2 (two) times daily. 60 tablet 6   atorvastatin  (LIPITOR) 10 MG tablet TAKE 1 TABLET BY MOUTH TWICE A WEEK 90 tablet 1   b complex vitamins capsule Take 1 capsule by mouth 3 (three) times a week.     Calcium  Carb-Cholecalciferol (CALCIUM  600-D PO) Take 600 mg by mouth in the morning and at bedtime.     cetirizine  (ZYRTEC ) 10 MG tablet TAKE 1 TABLET BY MOUTH DAILY (Patient taking differently: Take 10 mg by mouth as needed for allergies.) 30 tablet 6   COMIRNATY syringe      diltiazem  (CARDIZEM  CD) 120 MG 24 hr capsule Take 1 capsule (120 mg total) by mouth daily. 90 capsule 2   diltiazem  (CARDIZEM ) 30 MG tablet Take 1 tablet every 4 hours AS NEEDED for  AFIB heart rate >100 as long as top BP >100. 45 tablet 1   fluticasone  (FLONASE ) 50 MCG/ACT nasal spray INSTILL 2 SPRAYS INTO EACH NOSTRIL DAILY (Patient taking differently: Place 2 sprays into both nostrils as needed.) 16 g 6   glucosamine-chondroitin 500-400 MG tablet Take 1 tablet by mouth 3 (three) times daily. (Patient taking differently: Take 1 tablet by mouth 2 (two) times daily.)     Turmeric 400 MG CAPS Take 1 capsule by mouth as needed. (Patient taking differently: Take 1 capsule by mouth every morning.)     zolpidem  (AMBIEN ) 10 MG tablet Take 10 mg by mouth at bedtime as needed.     No current facility-administered medications for this  encounter.    Atrial Fibrillation Management history:  Previous antiarrhythmic drugs: none Previous cardioversions: none Previous ablations: none Anticoagulation history: Eliquis  5 mg BID   ROS- All systems are reviewed and negative except as per the HPI above.  Physical Exam: BP 124/64   Pulse 64   Ht 5' 5 (1.651 m)   Wt 85.5 kg   BMI 31.38 kg/m   GEN- The patient is well appearing, alert and oriented x 3 today.   Neck - no JVD or carotid bruit noted Lungs- Clear to ausculation bilaterally, normal work of breathing Heart- Regular rate and rhythm with ectopy noted, no murmurs, rubs or gallops, PMI not laterally displaced Extremities- no clubbing, cyanosis, or edema Skin - no rash or ecchymosis noted   EKG today demonstrates  EKG Interpretation Date/Time:  Friday November 24 2024 11:05:15 EST Ventricular Rate:  64 PR Interval:  182 QRS Duration:  124 QT Interval:  418 QTC Calculation: 431 R Axis:   9  Text Interpretation: Sinus rhythm with Blocked Premature atrial complexes Right bundle branch block Abnormal ECG When compared with ECG of 28-Mar-2024 10:26,  RBBB appears to be new Confirmed by Terra Pac ([id number]) on 11/24/2024 11:11:20 AM    Echo 03/17/24: 1. Left ventricular ejection fraction, by estimation, is 55 to 60%. The  left ventricle has normal function. The left ventricle has no regional  wall motion abnormalities. Left ventricular diastolic parameters were  normal.   2. Right ventricular systolic function is normal. The right ventricular  size is normal.   3. The mitral valve is normal in structure. Trivial mitral valve  regurgitation. No evidence of mitral stenosis.   4. The aortic valve is normal in structure. Aortic valve regurgitation is  not visualized. No aortic stenosis is present.   5. The inferior vena cava is normal in size with greater than 50%  respiratory variability, suggesting right atrial pressure of 3 mmHg.    ASSESSMENT &  PLAN CHA2DS2-VASc Score = 3  The patient's score is based upon: CHF History: 0 HTN History: 0 Diabetes History: 0 Stroke History: 0 Vascular Disease History: 0 Age Score: 2 Gender Score: 1       ASSESSMENT AND PLAN: Paroxysmal Atrial Fibrillation (ICD10:  I48.0) The patient's CHA2DS2-VASc score is 3, indicating a 3.2% annual risk of stroke.    Patient is currently in NSR.  We discussed rate versus rhythm control considering brief episodes of A-fib.  Patient has had 2-3 episodes every month, and although brief, her burden is a little bit more than expected.  To this effect, will trial diltiazem  120 mg daily.  Recommended to increase fluids consistently to avoid the potential for orthostatic hypotension.  Patient will begin diltiazem  120 mg daily and use diltiazem  30 as needed palpitations.  We did briefly discuss rhythm control if she has continued increase in A-fib burden despite daily rate control.   Secondary Hypercoagulable State (ICD10:  D68.69) The patient is at significant risk for stroke/thromboembolism based upon her CHA2DS2-VASc Score of 3.  Continue Apixaban  (Eliquis ).  No missed doses of Eliquis . Continue Eliquis  5 mg BID.     Follow up 3 months A-fib clinic.    Terra Pac, PA-C  Afib Clinic [facility] [address] [place], [place] [address] [phone]  "

## 2024-12-14 ENCOUNTER — Ambulatory Visit: Admitting: Family Medicine

## 2024-12-14 ENCOUNTER — Encounter: Payer: Self-pay | Admitting: Family Medicine

## 2024-12-14 VITALS — BP 110/66 | HR 75 | Ht 65.0 in | Wt 190.8 lb

## 2024-12-14 DIAGNOSIS — E559 Vitamin D deficiency, unspecified: Secondary | ICD-10-CM | POA: Diagnosis not present

## 2024-12-14 DIAGNOSIS — E785 Hyperlipidemia, unspecified: Secondary | ICD-10-CM

## 2024-12-14 DIAGNOSIS — Z0000 Encounter for general adult medical examination without abnormal findings: Secondary | ICD-10-CM

## 2024-12-14 DIAGNOSIS — Z1231 Encounter for screening mammogram for malignant neoplasm of breast: Secondary | ICD-10-CM

## 2024-12-14 NOTE — Assessment & Plan Note (Signed)
 Pt's PE WNL w/ exception of BMI.  No longer doing mammogram or colon cancer screening.  UTD on Tdap, PNA.  Check labs.  Anticipatory guidance provided.

## 2024-12-14 NOTE — Patient Instructions (Signed)
Follow up in 6 months to recheck BP and cholesterol We'll notify you of your lab results and make any changes if needed Keep up the good work on healthy diet and regular exercise- you're doing great! Call with any questions or concerns Stay Safe!  Stay Healthy! Happy New Year!!!

## 2024-12-14 NOTE — Progress Notes (Signed)
" ° °  Subjective:    Patient ID: Laura Stuart, female    DOB: 1945-12-01, 79 y.o.   MRN: [id number]  HPI CPE- UTD on colonoscopy (aged out) Tdap, PNA.  Pt is done w/ mammogram screening  Health Maintenance  Topic Date Due   Medicare Annual Wellness (AWV)  06/09/2024   Influenza Vaccine  02/27/2025 (Originally 06/30/2024)   Bone Density Scan  10/10/2026   DTaP/Tdap/Td (2 - Td or Tdap) 08/04/2028   Pneumococcal Vaccine: 50+ Years  Completed   Hepatitis C Screening  Completed   Zoster Vaccines- Shingrix  Completed   Meningococcal B Vaccine  Aged Out   Mammogram  Discontinued   Colonoscopy  Discontinued   COVID-19 Vaccine  Discontinued     Patient Care Team    Relationship Specialty Notifications Start End  Mahlon Comer BRAVO, MD PCP - General Family Medicine  02/04/12    Comment: Lavell Gores, [place], MD Consulting Physician Neurology  08/20/15   Abran Norleen SAILOR, MD Consulting Physician Gastroenterology  08/20/15   Verta Royden DASEN, [place] Consulting Physician Podiatry  08/24/17   [place], MD    06/23/22       Review of Systems Patient reports no hearing changes, adenopathy, fever, weight change,  persistant/recurrent hoarseness , swallowing issues, chest pain, palpitations, edema, persistant/recurrent cough, hemoptysis, dyspnea (rest/exertional/paroxysmal nocturnal), gastrointestinal bleeding (melena, rectal bleeding), abdominal pain, significant heartburn, bowel changes, GU symptoms (dysuria, hematuria, incontinence), Gyn symptoms (abnormal  bleeding, pain),  syncope, focal weakness, memory loss, numbness & tingling, skin/hair/nail changes, abnormal bruising or bleeding, anxiety, or depression.   + vision changes- appt pending    Objective:   Physical Exam General Appearance:    Alert, cooperative, no distress, appears stated age  Head:    Normocephalic, without obvious abnormality, atraumatic  Eyes:    PERRL, conjunctiva/corneas clear, EOM's intact both eyes  Ears:     Normal TM's and external ear canals, both ears  Nose:   Nares normal, septum midline, mucosa normal, no drainage    or sinus tenderness  Throat:   Lips, mucosa, and tongue normal; teeth and gums normal  Neck:   Supple, symmetrical, trachea midline, no adenopathy;    Thyroid : no enlargement/tenderness/nodules  Back:     Symmetric, no curvature, ROM normal, no CVA tenderness  Lungs:     Clear to auscultation bilaterally, respirations unlabored  Chest Wall:    No tenderness or deformity   Heart:    Regular rate and rhythm, S1 and S2 normal, no murmur, rub   or gallop  Breast Exam:    Deferred  Abdomen:     Soft, non-tender, bowel sounds active all four quadrants,    no masses, no organomegaly  Genitalia:    Deferred  Rectal:    Extremities:   Extremities normal, atraumatic, no cyanosis or edema  Pulses:   2+ and symmetric all extremities  Skin:   Skin color, texture, turgor normal, no rashes or lesions  Lymph nodes:   Cervical, supraclavicular, and axillary nodes normal  Neurologic:   CNII-XII intact, normal strength, sensation and reflexes    throughout          Assessment & Plan:  "

## 2024-12-15 LAB — CBC WITH DIFFERENTIAL/PLATELET
Basophils Absolute: 0.1 K/uL (ref 0.0–0.1)
Basophils Relative: 1.7 % (ref 0.0–3.0)
Eosinophils Absolute: 0.4 K/uL (ref 0.0–0.7)
Eosinophils Relative: 5.8 % — ABNORMAL HIGH (ref 0.0–5.0)
HCT: 40.8 % (ref 36.0–46.0)
Hemoglobin: 14 g/dL (ref 12.0–15.0)
Lymphocytes Relative: 26.4 % (ref 12.0–46.0)
Lymphs Abs: 1.6 K/uL (ref 0.7–4.0)
MCHC: 34.4 g/dL (ref 30.0–36.0)
MCV: 89.3 fl (ref 78.0–100.0)
Monocytes Absolute: 0.4 K/uL (ref 0.1–1.0)
Monocytes Relative: 7.2 % (ref 3.0–12.0)
Neutro Abs: 3.6 K/uL (ref 1.4–7.7)
Neutrophils Relative %: 58.9 % (ref 43.0–77.0)
Platelets: 277 K/uL (ref 150.0–400.0)
RBC: 4.57 Mil/uL (ref 3.87–5.11)
RDW: 14.2 % (ref 11.5–15.5)
WBC: 6.1 K/uL (ref 4.0–10.5)

## 2024-12-15 LAB — HEPATIC FUNCTION PANEL
ALT: 17 U/L (ref 3–35)
AST: 23 U/L (ref 5–37)
Albumin: 4.3 g/dL (ref 3.5–5.2)
Alkaline Phosphatase: 80 U/L (ref 39–117)
Bilirubin, Direct: 0.1 mg/dL (ref 0.1–0.3)
Total Bilirubin: 0.3 mg/dL (ref 0.2–1.2)
Total Protein: 6.6 g/dL (ref 6.0–8.3)

## 2024-12-15 LAB — TSH: TSH: 3.13 u[IU]/mL (ref 0.35–5.50)

## 2024-12-15 LAB — BASIC METABOLIC PANEL WITH GFR
BUN: 20 mg/dL (ref 6–23)
CO2: 29 meq/L (ref 19–32)
Calcium: 9.8 mg/dL (ref 8.4–10.5)
Chloride: 106 meq/L (ref 96–112)
Creatinine, Ser: 0.82 mg/dL (ref 0.40–1.20)
GFR: 68.41 mL/min
Glucose, Bld: 96 mg/dL (ref 70–99)
Potassium: 4.7 meq/L (ref 3.5–5.1)
Sodium: 143 meq/L (ref 135–145)

## 2024-12-15 LAB — LIPID PANEL
Cholesterol: 192 mg/dL (ref 28–200)
HDL: 56 mg/dL
LDL Cholesterol: 70 mg/dL (ref 10–99)
NonHDL: 136.05
Total CHOL/HDL Ratio: 3
Triglycerides: 331 mg/dL — ABNORMAL HIGH (ref 10.0–149.0)
VLDL: 66.2 mg/dL — ABNORMAL HIGH (ref 0.0–40.0)

## 2024-12-15 LAB — VITAMIN D 25 HYDROXY (VIT D DEFICIENCY, FRACTURES): VITD: 46.38 ng/mL (ref 30.00–100.00)

## 2024-12-16 ENCOUNTER — Encounter: Payer: Self-pay | Admitting: Family Medicine

## 2024-12-18 NOTE — Telephone Encounter (Signed)
 Patient reviewed lab results and has additional concerns.

## 2024-12-19 ENCOUNTER — Ambulatory Visit: Payer: Self-pay | Admitting: Family Medicine

## 2025-02-22 ENCOUNTER — Ambulatory Visit (HOSPITAL_COMMUNITY): Admitting: Internal Medicine

## 2025-06-13 ENCOUNTER — Ambulatory Visit: Admitting: Family Medicine
